# Patient Record
Sex: Male | Born: 1985 | Race: Black or African American | Hispanic: No | Smoking: Current every day smoker
Health system: Southern US, Community
[De-identification: ages and names within clinical notes are randomized; demographics above are authoritative.]

## PROBLEM LIST (undated history)

## (undated) ENCOUNTER — Emergency Department (HOSPITAL_COMMUNITY): Admission: EM | Payer: MEDICAID | Source: Home / Self Care

## (undated) DIAGNOSIS — R569 Unspecified convulsions: Secondary | ICD-10-CM

## (undated) DIAGNOSIS — F329 Major depressive disorder, single episode, unspecified: Secondary | ICD-10-CM

## (undated) DIAGNOSIS — F319 Bipolar disorder, unspecified: Secondary | ICD-10-CM

## (undated) DIAGNOSIS — F909 Attention-deficit hyperactivity disorder, unspecified type: Secondary | ICD-10-CM

## (undated) DIAGNOSIS — F209 Schizophrenia, unspecified: Secondary | ICD-10-CM

## (undated) DIAGNOSIS — F32A Depression, unspecified: Secondary | ICD-10-CM

## (undated) HISTORY — PX: NO PAST SURGERIES: SHX2092

## (undated) HISTORY — PX: OTHER SURGICAL HISTORY: SHX169

---
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
## 2007-08-18 ENCOUNTER — Emergency Department (HOSPITAL_COMMUNITY): Admission: EM | Admit: 2007-08-18 | Discharge: 2007-08-18 | Payer: Self-pay | Admitting: *Deleted

## 2008-08-08 ENCOUNTER — Emergency Department (HOSPITAL_COMMUNITY): Admission: EM | Admit: 2008-08-08 | Discharge: 2008-08-08 | Payer: Self-pay | Admitting: Family Medicine

## 2009-09-06 ENCOUNTER — Emergency Department (HOSPITAL_COMMUNITY): Admission: EM | Admit: 2009-09-06 | Discharge: 2009-09-06 | Payer: Self-pay | Admitting: Emergency Medicine

## 2011-03-09 NOTE — Discharge Summary (Signed)
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  The patient's status was made worse by being in jail for  car theft for one month without receiving his psychotropic medications,  Risperdal 2 mg b.i.d. and Depakote 1000 mg ER at bedtime, and he also takes  Ritalin 90 mg every morning.  For full details please see the typed  admission assessment.   SYNOPSIS OF PRESENT ILLNESS:  The patient was a poor historian and the  family was secretive and defensive, likely based in the cumulative  consequences the patient accrues not only for himself but also for the  family.  The patient suggests that his car theft was based in maladaptive  associations with peers involved in drug crimes.  The patient becomes bored  and self defeating in his activities.  He is not compliant with his  medications regularly either but has restarted his medications since  discharge.  He is threatening  to kill his grandmother and sister.  He has  been breaking dishes and slamming doors.  He talks and laughs to himself and  will posture for prolonged periods.  They did not prior to admission some  hypokinesia that may be a side effect of his Risperdal rather than posturing  associated with his psychosis.  He has some vague delusions and command  auditory hallucinations to harm himself and others.  The family has  acutely  disclosed that there is a relative with bipolar disorder.  The patient  resides with guardian grandmother and aunt is supportive as well.  Maternal  aunt has bipolar disorder.  Mother has depression.  Maternal grandmother  also has generalized anxiety.  Mother has alcoholism as does maternal uncle.  Maternal aunt has substance abuse with crack and alcohol, now in recovery.   INITIAL MENTAL STATUS EXAM:  The patient is odd and eccentric with some  posturing and possible bradykinesia and hypokinesia.  He did not have  delirium but he is eccentric.  He talks in a forceful, rather high-pitched  voice but seems to be defensive and reactionary.  Mood is labile.  He has  some ego preservation and affect is significantly preserved, so his mood is  chaotic and labile, with times of intermittent dysphoria.  He does have a  history of stuttering that is still a problem episodically.  He is more  homicidal than suicidal at the time of admission.   LABORATORY FINDINGS:  CBC on admission revealed platelet count slightly low  at 168,000  with reference range 170,000 to 325,000.  A repeat platelet count  3 days prior to discharge was 149,000, suggesting this was relative side  effect of Depakote.  Monocyte differential was 12% with reference range 3-10  and absolute monocyte count was normal at 600.  Neutrophil differential was  low at 39% but absolute neutrophil count was normal at 2200.  White count  was normal at 5600, hemoglobin 13, and MCV 89.  Comprehensive metabolic  panel was normal except albumin low at 3.4 with reference range 3.5-5.2 on  admission.  Sodium was normal was 143, potassium 4.2, glucose fasting 88,  creatinine 0.8, calcium 9.2, AST 18 and ALT 12.  GGT was normal at 1500.  Repeat hepatic function panel 3 days prior to discharge revealed albumin low  at 3.1 and total protein borderline low at 5.9 with reference range 6-8.3,  but AST normal at 19 and ALT 15.  Amylase was  normal 3 days prior to  discharge at 68.  TSH was normal at 2.431.  Depakote level of and was 102/3  mcg/ml and 4 days later was 98.2 mcg/ml suggesting steady state.  Urine drug  screen was negative with creatinine 234 mg/dl.  Urinalysis was normal with  specific gravity 1028 though with a trace of ketones.  Urine probes for  gonorrhea and chlamydia trichomatous by DNA amplification were both  negative.  Electrocardiogram on his discharge dose of 6 mg of Risperdal and  1000 mg of Depakote ER nightly revealed QTC of 411 milliseconds and QRS of  93 and PR of 135 milliseconds.  He had normal sinus rhythm, normal EKG with  conduction contra-indication to atypical anti-psychotics, with the patient  in general being a poor historian.   HOSPITAL COURSE AND TREATMENT:  General medical exam by [facility],  PA-C noted the patient's report of using alcohol every other day and liking  cigars.  The patient had scars around the left knee area K-9 dog bite in the  past.  He has a history of asthma.  He denies sexual activity.  He was  Tanner stage 4.  Admission height was 70 inches and weight 149 pounds and  discharge weight was 150 pounds.  Blood pressure on admission was 129/63  with heart rate of 69 sitting and 115/64 with heart rate of 74 standing.  Vital signs were normal throughout hospital stay.  At the time of discharge  supine blood pressure was 134/59 with heart rate of 74 and standing blood  pressure 142/77 with heart rate of 118.  The patient's Risperdal was  reestablished and Depakote continued.  Risperdal was gradually titrated up  midway through the hospital stay to 6 mg daily in the single nightly dose.  The patient continued to manifest some psychotic posturing and tended to  sleep by pulling his mattress off on the floor and covering his entire head  up.  This behavior continued until 2 days prior to discharge when he started sleeping more normally in his bed.  However he did  gradually tolerate  education on his chronic psychotic disorder diagnosis.  Bronson Ing, case  manager participated in treatment team staffing, documenting that she was  providing such education to the grandmother and the aunt.  The patient did  take his antihistamine throughout hospital stay.  As psychotic symptoms  cleared, he continued to have some bradykinesia and hypokinesia such that  Benztropine 1 mg nightly was prescribed on the day of discharge, as  grandmother emphasized that the  patient has some hypokinesia on Risperdal  even prior to his current recent decompensation and hospitalization.  The  patient required no seclusion, restraint or equivalent of such during the  hospital stay, including no p.r.n. medications being required.  The patient  complied with his medications in the hospital and had no significant side  effects  other than hypokinesia.  He participated in substance abuse  prevention, anger management, group, milieu, behavioral, individual, family,  special education, occupational and therapeutic recreational therapies.  His  homicidal and suicidal risk resolved and he was discharged to grandmother in  improved condition.   FINAL DIAGNOSES:  AXIS 1:  1.  Schizoaffective disorder, mixed.  2.  Attention deficit hyperactivity disorder, combined type, mild to      moderate severity currently.  3.  Stuttering.  4.  Psychoactive substance abuse not otherwise specified.  5.  Oppositional-defiant disorder.  6.  Other interpersonal problem.  7.  Other specified family circumstances.  8.  Noncompliance with treatment.  AXIS II:  Diagnosis deferred.  AXIS III:  1.  Chronic nose bleeds by history.  2.  Cigar smoking.  3.  Allergic rhinitis and asthma.  4.  Migraine.  5.  Refuses eyeglasses.  6.  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  AXIS IV:  Stressors:  Family - severe, acute and chronic; legal - severe, acute and  chronic; phase  of life - severe, acute and chronic; school - severe, acute  and chronic.  AXIS V:  Global assessment of function on admission 31 with highest in last year 58  and discharge global assessment of function was 52.   PLAN:  The patient gradually showed more social involvement, particularly  over the last couple of days of his hospital stay.  Midway through his  hospital stay as his psychotic symptoms were being clarified and relatively  confronted, the patient regressed for one morning, refusing to come out of  his room.  Subsequently he did work through the ability to participate in  social expectations and to ensure safety and protectiveness for his  grandmother and sister.  He must remain sober and have no substance abuse.  He must remain free from any felonious or other illegal behavior.  He was  discharged on the following medications.  1.  Depakote 500 mg ER tablet to take 2 every bedtime, quantity #60 with no      refill prescribed. 2.  Risperdal 2 mg tablet to take 3 tablets or 6 mg every bedtime, quantity      #90 with no refill prescribed.  3.  Benztropine 1 mg every bedtime, quantity #30 with no refill.  4.  Zyrtec 10 mg tablet daily if needed for allergies and nasal congestion,      own home supply.  He follows a regular diet and has no restrictions on physical activity.  Crisis and safety plans are outlined if needed.  He has probation  established and case management services and has been provided teaching on  chronic schizoaffective disorder for lifestyle and family based  interventions and support.  Family wishes as does case Production designer, theatre/television/film follow up at  the [facility] for his psychiatric care and they  will arrange the appointment, as per grandmother and aunt on the day of  discharge.     Glen   GJ/MEDQ  D:  09/15/2004  T:  09/15/2004  Job:  [id number]   cc:   Nelson Chimes  probation officer  [phone]  Bronson Ing, `  Case Manager   [phone]   [facility]  [address]  [place], [place]

## 2011-03-09 NOTE — H&P (Signed)
NAMELAITHAN, CONCHAS NO.:  [id number]   MEDICAL RECORD NO.:  [id number]          PATIENT TYPE:  IPS   LOCATION:  0501                          FACILITY:  [facility]   PHYSICIAN:  Jeanice Lim, M.D. DATE OF BIRTH:  07/18/1986   DATE OF ADMISSION:  12/17/2005  DATE OF DISCHARGE:                         PSYCHIATRIC ADMISSION ASSESSMENT   IDENTIFYING INFORMATION:  The patient is an 25 year old single African-  American male voluntarily admitted on December 15, 2005.   HISTORY OF PRESENT ILLNESS:  The patient presents with a history of  psychosis, having suicidal thoughts to hang himself.  The patient states he  also has been using substances, drinking wine, stating up to 5 liters on  Tuesday.  He has been drinking since the age of 54.  He drinks in the  morning.  He states that he would rather drink wine than drink coffee.  Smoking pot, sniffing powder.  The patient is experiencing auditory  hallucinations with voices telling him to kill people in the group home for  no specific reason.  He also has been taking Xanax.  Took some last week.  He states he took 5 only to get high not as a suicide attempt.  States he  is having trouble sleeping.  He states that he also sees dead people, seeing  his grandfather who he misses.  The patient has been noncompliant with his  medications for some time and has been in a group home since June of 2006.   PAST PSYCHIATRIC HISTORY:  The patient was here in 2005 on the child and  adolescent unit.  Was hospitalized at [facility] in June of 2006 for  cutting his wrist.  He is currently in outpatient at [facility].   SOCIAL HISTORY:  This is an 25 year old single African-American male.  He  does have two children, ages 73 and 1.  Children are with the mother of those  children.  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  He did have a legal guardian up  until he turned 22 which was his grandmother.  He is currently residing in a  group home.   Considering changing to a different group home.  The patient  states he was in jail last night for trespassing and has been in jail before  for car theft.  He states he is currently on probation until October of  2007.  He completed high school.   FAMILY HISTORY:  Has a maternal aunt who is bipolar.   ALCOHOL/DRUG HISTORY:  The patient smokes.  Mother with alcohol problems.   PRIMARY CARE PHYSICIAN:  Dr. Jacqulyn Bath and Dr. Fanny Skates.   MEDICAL PROBLEMS:  History of epilepsy, reports tonic clonic seizure  activity.  Last seizure was seven months ago.   MEDICATIONS:  Is to be on Depakote at bedtime, Risperdal 4 mg at bedtime,  Keppra 250 mg b.i.d.  Also takes an albuterol inhaler as needed.  He gets  his prescriptions at [facility] in [place].   ALLERGIES:  No known allergies.   PHYSICAL EXAMINATION:  This is a young, healthy male.  He was medically  cleared at the emergency department.  He did receive Tegretol 400 mg.   REVIEW OF SYSTEMS:  The patient reports a weight gain.  No fever.  No  chills.  Denies any cardiac problems.  Occasional shortness of breath.  The  patient does smoke.  Denies any bruising.  Positive seizure activity.  Clonic tonic seizure activity.  No nausea or vomiting.  Sexually active.  No  muscle weakness.  No joint swelling.  Positive for psychosis.   LABORATORY DATA:  BMET was within normal limits.  Alcohol level less than 5.  Urine drug screen was positive for benzodiazepines.   MENTAL STATUS EXAM:  He is a fully alert, cooperative male.  Fair eye  contact.  Casually dressed.  Speech:  The patient is sometimes difficult to  understand.  The patient feels fine.  Affect:  The patient is bright and  polite.  The patient endorsing auditory and visual hallucinations, some  passive suicidal thoughts and homicidal thoughts but will contract for  safety for self-harm and harm to others.  Cognitive function intact.  He  appears somewhat limited.  Judgment and  insight are poor.  He is somewhat of  a questionable historian, also a poor historian.   DIAGNOSES:  AXIS I:  Bipolar disorder, mixed.  Rule out schizoaffective  disorder.  Polysubstance abuse.  AXIS II:  Deferred.  AXIS III:  Epilepsy.  AXIS IV:  Problems related to social environment, other social problems  related to burden of illness, lack of primary support.  AXIS V:  Current 38.   PLAN:  Contract for safety.  Stabilize mood and thinking.  Will resume his  medications.  Reinforce medication compliance.  Will address substance abuse  and underage drinking.  The patient to be placed on seizure precautions.  Will again resume patient's medications and check Depakote levels for  therapeutic range.  The patient is to increase coping skills.  Casemanager  is to speak with patient about returning to group home as patient is having  second thoughts about returning to current facility.  The patient is to  follow with [facility].   TENTATIVE LENGTH OF STAY:  Five to six days.      Landry Corporal, N.P.      Jeanice Lim, M.D.  Electronically Signed    JO/MEDQ  D:  12/19/2005  T:  12/19/2005  Job:  [id number]

## 2011-03-09 NOTE — H&P (Signed)
NAMERILYN, UPSHAW NO.:  [id number]   MEDICAL RECORD NO.:  [id number]          PATIENT TYPE:  INP   LOCATION:  0203                          FACILITY:  [facility]   PHYSICIAN:  Beverly Milch, MD     DATE OF BIRTH:  1986-02-07   DATE OF ADMISSION:  09/07/2004  DATE OF DISCHARGE:                         PSYCHIATRIC ADMISSION ASSESSMENT   IDENTIFYING DATA:  This 25-year-old male, GED student at [employer], is admitted emergently voluntarily in transfer from [facility] as also referred by his probation officer,  Willa Frater, at [phone] for inpatient stabilization of suicide and  homicide risk with psychotic disorganization and mood instability.  The  patient reportedly has been under the outpatient care, since July of 2005,  of [facility] and acknowledges a Leonia Reader as a Pensions consultant.  The patient has been off of his medications for the last month as  he was placed in jail for car theft and was not given any of his  psychotropic medications there.   HISTORY OF PRESENT ILLNESS:  The patient is a poor historian and I do  attempt to confer with guardian grandmother the details.  Although he  suggests he has been off of his medications, his Depakote level, at the time  of admission, is 102 mg/ml suggesting either that he has been taking it  enough since release from jail to restore his blood level or that he might  have ingested in some other way such as self-destructiveness.  The patient  called his probation officer asking for help.  He has been breaking dishes  and slamming doors.  He has threatened to kill his grandmother and sister.  He talks and laughs to himself and postures at times for prolonged periods.  He reports delusions of other people being there and reports command  auditory hallucinations to harm himself and others.  The patient will not be  more specific about his own behavior.   He suggests that he was told to steal  the car by drug distribution associated peers.  The patient suggests that he  has access to street-type crime groups and also seems frightened by that.  Although the patient has made homicide threats, he is not otherwise violent  on admission, though he is odd and disorganized.  He has been disoriented at  home with grandmother reporting that the patient does not know where he is  and when it is lately since he got home for jail.  The patient has had no  intercurrent head injury that can be determined.  He can be social with  peers, though he is much more alienated around adults.  He reports using  alcohol every other day and cigars at times.  He had an inpatient stay at  [facility] in [place] for psychiatric care in 2001.  He has  apparently seen [facility] in the past for mental health care.  The  patient is not more honest or open about his intrapsychic experience.  He  does not acknowledge specific anxiety.  He does have a chronic history  of  stuttering and ADHD.  There was concern about substance abuse and certainly  has had oppositional defiance if not meeting criteria for conduct disorder  yet.   PAST MEDICAL HISTORY:  The patient reportedly has a history of asthma.  He  has dog bite scars on the left knee and left wrist from canines in the past.  He is not sexually active by self-report.  He does report chronic  nosebleeds.  He has migraines.  He has a history of asthma.  He refuses to  wear his glasses currently.  He has no medication allergies.  He denies  other organic central nervous system trauma.  His Depakote level is  therapeutic at 102 mg/ml with albumin slightly low at 3.4 and platelet count  low at 168,000 suggesting ongoing Depakote use.  Albumin is low at 3.4 with  lower limit of normal being 3.5.   REVIEW OF SYSTEMS:  The patient denies difficulty with gait, gaze or  countenance.  He denies exposure to  communicable disease or toxins.  He  denies rash, jaundice or purpura.  He has no chest pain, palpitations or  presyncope.  He has had no known seizure or syncope.  He has had no heart  murmur or arrhythmia.  He has had no abdominal pain, nausea, vomiting or  diarrhea.  There is no dysuria or arthralgia.  His Zyrtec seems likely for  chronic nosebleeds and possible allergic rhinitis.   IMMUNIZATIONS:  Up to date by history.   FAMILY HISTORY:  The patient resides with guardian grandmother.  He will not  give further information about biological parents.  However, he has  apparently been threatening to his biological sister including threatening  to kill her and grandmother.  Grandmother states he was just too  disorganized and disoriented at home to be safe and then started making  homicide and suicide threats.  The patient was in jail from October 1st  through October 28th of 2005 for car theft and driving without a license.  He apparently also faces court proceedings for a breaking and entering  charge.  He suggests that some of this was associated with drug distribution  by peers.  He suggests he is in the GED program at [employer].  He  repeated ninth grade three times.   ASSETS:  The patient does seem social and does not manifest ego  disintegration.   MENTAL STATUS EXAM:  Height is 70 inches and weight is 149 pounds with blood  pressure 129/63 with heart rate of 69 (sitting) and 115/64 with heart rate  of 74 (standing).  Neurological exam is generally intact.  The patient is  odd and eccentric with some posturing in mannerisms but no definite other  abnormal involuntary movements.  He is on Risperdal but does not manifest  overt parkinsonism or akathisia.  He is alert and seems oriented on arrival.  However, he has times of being highly eccentric.  He does not present with delirium.  The patient has no abnormal involuntary movements otherwise.  He  has no pathologic reflexes or  soft neurologic findings.  Gait and gaze are  intact.  Alternating motion rates are intact.  The patient seems to have a  suspicious interpersonal style and will partially cover his face and head.  He seems to simultaneously be irritable and reactionary while also cowering.  He talks fast with a rather high-pitched, forced voice.  He is more  comfortable with peers than adults.  He  gives only partial answers.  His  mood is labile.  He is disheveled but does not seem to have ego  disintegration suggesting definite schizophrenia but rather some ego  preservation and affect preservation and lability suggesting more  schizoaffective pathology.  He is associated with a drug distribution peer  group by his self-report and some of his suicide and homicide ideation may  also be incorporated into that group socially.  However, guardian  grandmother is significantly frightened and alienated by the patient's  threats to kill her and the patient's sister.  The patient has also made  suicide threats.  He would have chronic stuttering but does not manifest  stuttering at this time.  He has a history of inattention, hyperactivity and  impulsivity, though his impulsivity is most paramount now.  He reportedly  has been taking Ritalin 90 mg every morning, Depakote 1000 mg ER at bedtime,  Risperdal 2 mg b.i.d. and Zyrtec 5 mg q.h.s.   IMPRESSION:   AXIS I:  1.  Schizoaffective disorder, mixed (provisional diagnosis).  2.  Attention-deficit hyperactivity disorder, combined-type, moderate      severity by history.  3.  Stuttering.  4.  Psychoactive substance abuse not otherwise specified.  5.  Oppositional defiant disorder.  6.  Other interpersonal problem.  7.  Other specified family circumstances.  8.  Noncompliance with treatment, particularly by the jail in the month of      October.   AXIS II:  Diagnosis deferred.   AXIS III:  1.  Chronic nosebleeds by history.  2.  Asthma and likely allergic  rhinitis.  3.  Migraines.  4.  Refuses eyeglasses.   AXIS IV:  Stressors:  Family--severe, acute and chronic; legal--severe,  acute and chronic; phase of life--severe, acute and chronic; school--severe,  acute and chronic.   AXIS V:  Global Assessment of Functioning on admission 31; highest in last  year estimated 58.   PLAN:  The patient is admitted for inpatient adolescent psychiatric and  multidisciplinary, multimodal behavioral health treatment in a team-based  program at a locked psychiatric unit.   ESTIMATED LENGTH OF STAY:  Seven to 10 days.  Target symptoms for discharge  include stabilization of suicide and homicide risk, stabilization of active  psychosis, stabilization of mood and stabilization of disruptive behavior  that undermines the stability of other psychopathology.  Will discontinue  Ritalin.  Will restore Depakote and Risperdal.  Okay for Zyrtec but will observe and treat nosebleeds as necessary.  Cognitive behavioral therapy,  psychosocial rehabilitation, social skill training, anger management,  communication skills, family therapy and psychosocial coordination with  probation can be planned.     Glen   GJ/MEDQ  D:  09/08/2004  T:  09/08/2004  Job:  [id number]
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
## 2011-08-01 LAB — I-STAT 8, (EC8 V) (CONVERTED LAB)
Acid-Base Excess: 4 — ABNORMAL HIGH
BUN: 19
Bicarbonate: 30.6 — ABNORMAL HIGH
Chloride: 105
Glucose, Bld: 76
HCT: 46
Hemoglobin: 15.6
Operator id: 196461
Potassium: 3.5
Sodium: 141
TCO2: 32
pCO2, Ven: 54.3 — ABNORMAL HIGH
pH, Ven: 7.359 — ABNORMAL HIGH

## 2011-08-01 LAB — POCT I-STAT CREATININE
Creatinine, Ser: 1
Operator id: 196461

## 2011-08-01 LAB — VALPROIC ACID LEVEL: Valproic Acid Lvl: 125 — ABNORMAL HIGH

## 2011-12-19 ENCOUNTER — Encounter (HOSPITAL_COMMUNITY): Payer: Self-pay | Admitting: *Deleted

## 2011-12-19 ENCOUNTER — Emergency Department (HOSPITAL_COMMUNITY)
Admission: EM | Admit: 2011-12-19 | Discharge: 2011-12-19 | Disposition: A | Discharge status: Home / Self Care | Payer: Self-pay | Attending: Emergency Medicine | Admitting: Emergency Medicine

## 2011-12-19 DIAGNOSIS — J069 Acute upper respiratory infection, unspecified: Secondary | ICD-10-CM | POA: Insufficient documentation

## 2011-12-19 DIAGNOSIS — R05 Cough: Secondary | ICD-10-CM | POA: Insufficient documentation

## 2011-12-19 DIAGNOSIS — R059 Cough, unspecified: Secondary | ICD-10-CM | POA: Insufficient documentation

## 2011-12-19 DIAGNOSIS — R599 Enlarged lymph nodes, unspecified: Secondary | ICD-10-CM | POA: Insufficient documentation

## 2011-12-19 HISTORY — DX: Unspecified convulsions: R56.9

## 2011-12-19 MED ORDER — BENZONATATE 100 MG PO CAPS: 100.0000 mg | ORAL_CAPSULE | Freq: Three times a day (TID) | ORAL | Status: DC

## 2011-12-19 MED ORDER — IBUPROFEN 600 MG PO TABS: 600.0000 mg | ORAL_TABLET | Freq: Four times a day (QID) | ORAL | Status: AC | PRN

## 2011-12-19 NOTE — ED Provider Notes (Signed)
History     CSN: [id number]  Arrival date & time 12/19/11  0226   First MD Initiated Contact with Patient 12/19/11 [phone]      Chief Complaint  Patient presents with  . Sore Throat    (Consider location/radiation/quality/duration/timing/severity/associated sxs/prior treatment) HPI Pt with 1-2 weeks of sore throat, cough, congestion. No fever chills. Pt states he has had occasional blood streaks in the sputum. No post neck pain or stiffness. No sinus congestion or pain.  Past Medical History  Diagnosis Date  . Asthma   . Seizures     History reviewed. No pertinent past surgical history.  Family History  Problem Relation Age of Onset  . Seizures Father   . Asthma Other   . Seizures Other     History  Substance Use Topics  . Smoking status: Current Everyday Smoker -- 0.5 packs/day  . Smokeless tobacco: Not on file  . Alcohol Use: Yes     every 2-3d      Review of Systems  Constitutional: Negative for fever and chills.  HENT: Positive for congestion and sore throat. Negative for ear pain, facial swelling, rhinorrhea and sinus pressure.   Eyes: Negative for visual disturbance.  Respiratory: Positive for cough. Negative for chest tightness and wheezing.   Cardiovascular: Negative for chest pain, palpitations and leg swelling.  Gastrointestinal: Negative for nausea, vomiting and abdominal pain.  Musculoskeletal: Negative for myalgias and back pain.  Neurological: Negative for weakness, numbness and headaches.    Allergies  Review of patient's allergies indicates no known allergies.  Home Medications   Current Outpatient Rx  Name Route Sig Dispense Refill  . BENZONATATE 100 MG PO CAPS Oral Take 1 capsule (100 mg total) by mouth every 8 (eight) hours. 21 capsule 0  . IBUPROFEN 600 MG PO TABS Oral Take 1 tablet (600 mg total) by mouth every 6 (six) hours as needed for pain. 30 tablet 0    BP 132/77  Pulse 60  Temp(Src) 96.6 F (35.9 C) (Oral)  Resp 16  SpO2  98%  Physical Exam  Nursing note and vitals reviewed. Constitutional: He is oriented to person, place, and time. He appears well-developed and well-nourished. No distress.  HENT:  Head: Normocephalic and atraumatic.  Mouth/Throat: Oropharynx is clear and moist. No oropharyngeal exudate.       No sinus tenderness to percussion  Eyes: EOM are normal. Pupils are equal, round, and reactive to light.  Neck: Normal range of motion. Neck supple.       No nuchal rigidity or meningismus   Cardiovascular: Normal rate and regular rhythm.   Pulmonary/Chest: Effort normal and breath sounds normal. No respiratory distress. He has no wheezes. He has no rales. He exhibits no tenderness.  Abdominal: Soft. Bowel sounds are normal. There is no tenderness. There is no rebound and no guarding.  Musculoskeletal: Normal range of motion. He exhibits no edema and no tenderness.  Lymphadenopathy:    He has cervical adenopathy (mild ant cervical lymphadenopathy).  Neurological: He is alert and oriented to person, place, and time.  Skin: Skin is warm and dry. No rash noted. No erythema.  Psychiatric: He has a normal mood and affect. His behavior is normal.    ED Course  Procedures (including critical care time)  Labs Reviewed - No data to display No results found.   1. URI (upper respiratory infection)       MDM  Pt is well appearing. Will treat as viral URI.  Loren Racer, MD 12/19/11 [phone]

## 2011-12-19 NOTE — ED Notes (Signed)
C/o sore throat, onset 2d ago, brought here by friend, dropped off, carrying [employer] food & drink with him, also mentions cough & cold sx, "get hot a lot", denies other sx.

## 2011-12-19 NOTE — ED Notes (Signed)
C/o sorethroat and neck pain onset several days ago. C/o pain with swallowing.

## 2011-12-23 ENCOUNTER — Encounter (HOSPITAL_COMMUNITY): Payer: Self-pay | Admitting: *Deleted

## 2011-12-23 ENCOUNTER — Emergency Department (HOSPITAL_COMMUNITY)
Admission: EM | Admit: 2011-12-23 | Discharge: 2011-12-23 | Disposition: A | Discharge status: Home / Self Care | Payer: Self-pay | Attending: Emergency Medicine | Admitting: Emergency Medicine

## 2011-12-23 ENCOUNTER — Encounter (HOSPITAL_COMMUNITY): Payer: Self-pay

## 2011-12-23 ENCOUNTER — Emergency Department (HOSPITAL_COMMUNITY): Payer: Self-pay

## 2011-12-23 ENCOUNTER — Emergency Department (HOSPITAL_COMMUNITY)
Admission: EM | Admit: 2011-12-23 | Discharge: 2011-12-25 | Disposition: A | Discharge status: Other Acute Inpatient Hospital | Payer: Self-pay | Attending: Emergency Medicine | Admitting: Emergency Medicine

## 2011-12-23 DIAGNOSIS — J45909 Unspecified asthma, uncomplicated: Secondary | ICD-10-CM | POA: Insufficient documentation

## 2011-12-23 DIAGNOSIS — K1370 Unspecified lesions of oral mucosa: Secondary | ICD-10-CM | POA: Insufficient documentation

## 2011-12-23 DIAGNOSIS — X58XXXA Exposure to other specified factors, initial encounter: Secondary | ICD-10-CM | POA: Insufficient documentation

## 2011-12-23 DIAGNOSIS — IMO0002 Reserved for concepts with insufficient information to code with codable children: Secondary | ICD-10-CM | POA: Insufficient documentation

## 2011-12-23 DIAGNOSIS — G40909 Epilepsy, unspecified, not intractable, without status epilepticus: Secondary | ICD-10-CM | POA: Insufficient documentation

## 2011-12-23 DIAGNOSIS — IMO0001 Reserved for inherently not codable concepts without codable children: Secondary | ICD-10-CM | POA: Insufficient documentation

## 2011-12-23 DIAGNOSIS — K0381 Cracked tooth: Secondary | ICD-10-CM | POA: Insufficient documentation

## 2011-12-23 DIAGNOSIS — K1379 Other lesions of oral mucosa: Secondary | ICD-10-CM

## 2011-12-23 DIAGNOSIS — K089 Disorder of teeth and supporting structures, unspecified: Secondary | ICD-10-CM | POA: Insufficient documentation

## 2011-12-23 DIAGNOSIS — Z79899 Other long term (current) drug therapy: Secondary | ICD-10-CM | POA: Insufficient documentation

## 2011-12-23 DIAGNOSIS — S025XXA Fracture of tooth (traumatic), initial encounter for closed fracture: Secondary | ICD-10-CM

## 2011-12-23 DIAGNOSIS — R45850 Homicidal ideations: Secondary | ICD-10-CM | POA: Insufficient documentation

## 2011-12-23 HISTORY — DX: Attention-deficit hyperactivity disorder, unspecified type: F90.9

## 2011-12-23 HISTORY — DX: Schizophrenia, unspecified: F20.9

## 2011-12-23 HISTORY — DX: Bipolar disorder, unspecified: F31.9

## 2011-12-23 LAB — URINALYSIS, ROUTINE W REFLEX MICROSCOPIC
Glucose, UA: NEGATIVE mg/dL
Hgb urine dipstick: NEGATIVE
Nitrite: NEGATIVE
Protein, ur: NEGATIVE mg/dL
Urobilinogen, UA: 1 mg/dL (ref 0.0–1.0)
pH: 6 (ref 5.0–8.0)

## 2011-12-23 LAB — COMPREHENSIVE METABOLIC PANEL
ALT: 11 U/L (ref 0–53)
AST: 19 U/L (ref 0–37)
BUN: 20 mg/dL (ref 6–23)
Calcium: 9.9 mg/dL (ref 8.4–10.5)
Chloride: 101 mEq/L (ref 96–112)
Creatinine, Ser: 0.87 mg/dL (ref 0.50–1.35)
GFR calc Af Amer: >90 mL/min (ref >90)
GFR calc non Af Amer: >90 mL/min (ref >90)
Glucose, Bld: 68 mg/dL — ABNORMAL LOW (ref 70–99)
Sodium: 137 mEq/L (ref 135–145)
Total Bilirubin: 1 mg/dL (ref 0.3–1.2)
Total Protein: 7.7 g/dL (ref 6.0–8.3)

## 2011-12-23 LAB — RAPID URINE DRUG SCREEN, HOSP PERFORMED
Amphetamines: NOT DETECTED
Barbiturates: NOT DETECTED
Benzodiazepines: NOT DETECTED
Cocaine: NOT DETECTED
Opiates: NOT DETECTED
Tetrahydrocannabinol: POSITIVE — AB

## 2011-12-23 LAB — CBC
HCT: 40.9 % (ref 39.0–52.0)
Hemoglobin: 14.1 g/dL (ref 13.0–17.0)
MCH: 30 pg (ref 26.0–34.0)
MCHC: 34.5 g/dL (ref 30.0–36.0)
MCV: 87 fL (ref 78.0–100.0)
RDW: 13.7 % (ref 11.5–15.5)
WBC: 10.9 10*3/uL — ABNORMAL HIGH (ref 4.0–10.5)

## 2011-12-23 LAB — ETHANOL: Alcohol, Ethyl (B): <11 mg/dL (ref 0–11)

## 2011-12-23 MED ORDER — IBUPROFEN 800 MG PO TABS: 800.0000 mg | ORAL_TABLET | Freq: Three times a day (TID) | ORAL | Status: DC

## 2011-12-23 MED ORDER — IBUPROFEN 600 MG PO TABS: 600.0000 mg | ORAL_TABLET | Freq: Three times a day (TID) | ORAL | Status: DC | PRN

## 2011-12-23 MED ORDER — ZIPRASIDONE MESYLATE 20 MG IM SOLR: 10.0000 mg | INTRAMUSCULAR | Status: DC | PRN

## 2011-12-23 MED ORDER — ACETAMINOPHEN 325 MG PO TABS: 650.0000 mg | ORAL_TABLET | ORAL | Status: DC | PRN
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
## 2011-12-23 NOTE — Discharge Instructions (Signed)
Your x-ray did not show signs of a broken jaw. Please followup with your dentist as needed for your chipped tooth. Please take the ibuprofen 3 times a day, with food, for the next 3 days. Return to the ER for worsening condition.  Dental Injury Your exam shows that you have injured your teeth. The treatment of broken teeth and other dental injuries depends on how badly they are hurt. All dental injuries should be checked as soon as possible by a dentist if there are:  Loose teeth which may need to be wired or bonded with a plastic device to hold them in place.   Broken teeth with exposed tooth pulp which may cause a serious infection.   Painful teeth especially when you bite or chew.   Sharp tooth edges that cut your tongue or lips.  Sometimes, antibiotics or pain medicine are prescribed to prevent infection and control pain. Eat a soft or liquid diet and rinse your mouth out after meals with warm water. You should see a dentist or return here at once if you have increased swelling, increased pain or uncontrolled bleeding from the site of your injury. SEEK MEDICAL CARE IF:   You have increased pain not controlled with medicines.   You have swelling around your tooth, in your face or neck.   You have bleeding which starts, continues, or gets worse.   You have a fever.  Document Released: 10/08/2005 Document Revised: 09/27/2011 Document Reviewed: 10/07/2009 [facility] Patient Information 2012 [place], [place].
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
## 2011-12-24 LAB — VALPROIC ACID LEVEL: Valproic Acid Lvl: <10 ug/mL — ABNORMAL LOW (ref 50.0–100.0)

## 2011-12-24 NOTE — ED Provider Notes (Signed)
Psychiatric evaluation is ongoing. Disposition is currently pending. Patient has no current complaints and remains stable at this time.   Awaiting 400 hall bed   [facility] A. Patrica Duel, MD 12/24/11 [phone]

## 2011-12-24 NOTE — ED Notes (Signed)
Pt resting quietly. Waiting for placement. Labs back and valporic acid level low. Waiting for orders.
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
## 2011-12-24 NOTE — ED Notes (Signed)
ACT team in to see patient. 

## 2011-12-24 NOTE — ED Notes (Signed)
Spoke with Dr. Patria Mane ie low valporic acid level for this pt. He will review the pt chart, and is aware pt is to go to [facility] hall.

## 2011-12-24 NOTE — ED Notes (Signed)
Patient resting quietly and is w/o c/o.

## 2011-12-25 ENCOUNTER — Encounter (HOSPITAL_COMMUNITY): Payer: Self-pay | Admitting: *Deleted

## 2011-12-25 ENCOUNTER — Inpatient Hospital Stay (HOSPITAL_COMMUNITY)
Admission: AD | Admit: 2011-12-25 | Discharge: 2012-01-03 | DRG: 885 | Payer: PRIVATE HEALTH INSURANCE | Source: Ambulatory Visit | Attending: Psychiatry | Admitting: Psychiatry

## 2011-12-25 DIAGNOSIS — F1010 Alcohol abuse, uncomplicated: Secondary | ICD-10-CM | POA: Diagnosis present

## 2011-12-25 DIAGNOSIS — F29 Unspecified psychosis not due to a substance or known physiological condition: Secondary | ICD-10-CM | POA: Insufficient documentation

## 2011-12-25 DIAGNOSIS — F259 Schizoaffective disorder, unspecified: Principal | ICD-10-CM

## 2011-12-25 DIAGNOSIS — F319 Bipolar disorder, unspecified: Secondary | ICD-10-CM

## 2011-12-25 DIAGNOSIS — R45850 Homicidal ideations: Secondary | ICD-10-CM

## 2011-12-25 DIAGNOSIS — Z111 Encounter for screening for respiratory tuberculosis: Secondary | ICD-10-CM

## 2011-12-25 DIAGNOSIS — F250 Schizoaffective disorder, bipolar type: Secondary | ICD-10-CM | POA: Diagnosis present

## 2011-12-25 DIAGNOSIS — Z888 Allergy status to other drugs, medicaments and biological substances status: Secondary | ICD-10-CM

## 2011-12-25 DIAGNOSIS — F209 Schizophrenia, unspecified: Secondary | ICD-10-CM | POA: Insufficient documentation

## 2011-12-25 DIAGNOSIS — G40909 Epilepsy, unspecified, not intractable, without status epilepticus: Secondary | ICD-10-CM

## 2011-12-25 DIAGNOSIS — J45909 Unspecified asthma, uncomplicated: Secondary | ICD-10-CM

## 2011-12-25 DIAGNOSIS — Z79899 Other long term (current) drug therapy: Secondary | ICD-10-CM

## 2011-12-25 DIAGNOSIS — Z91013 Allergy to seafood: Secondary | ICD-10-CM

## 2011-12-25 DIAGNOSIS — F1210 Cannabis abuse, uncomplicated: Secondary | ICD-10-CM | POA: Diagnosis present

## 2011-12-25 MED ORDER — TRAZODONE HCL 50 MG PO TABS
150.0000 mg | ORAL_TABLET | Freq: Every evening | ORAL | Status: DC | PRN
  Administered 2011-12-25: 150 mg via ORAL
  Filled 2011-12-25: qty 2

## 2011-12-25 MED ORDER — ALUM & MAG HYDROXIDE-SIMETH 200-200-20 MG/5ML PO SUSP: 30.0000 mL | ORAL | Status: DC | PRN

## 2011-12-25 MED ORDER — IBUPROFEN 800 MG PO TABS
800.0000 mg | ORAL_TABLET | Freq: Three times a day (TID) | ORAL | Status: DC | PRN
  Administered 2012-01-01: 800 mg via ORAL
  Filled 2011-12-25: qty 1

## 2011-12-25 MED ORDER — HYDROXYZINE HCL 25 MG PO TABS
25.0000 mg | ORAL_TABLET | Freq: Three times a day (TID) | ORAL | Status: DC | PRN
  Administered 2012-01-01: 25 mg via ORAL

## 2011-12-25 MED ORDER — BENZTROPINE MESYLATE 1 MG PO TABS
1.0000 mg | ORAL_TABLET | Freq: Every day | ORAL | Status: DC
  Administered 2011-12-25 – 2011-12-30 (×6): 1 mg via ORAL
  Filled 2011-12-25 (×10): qty 1

## 2011-12-25 MED ORDER — DIVALPROEX SODIUM ER 500 MG PO TB24
500.0000 mg | ORAL_TABLET | ORAL | Status: DC
  Administered 2011-12-25 – 2012-01-03 (×18): 500 mg via ORAL
  Filled 2011-12-25 (×7): qty 1
  Filled 2011-12-25: qty 24
  Filled 2011-12-25 (×7): qty 1
  Filled 2011-12-25: qty 24
  Filled 2011-12-25 (×7): qty 1

## 2011-12-25 MED ORDER — HALOPERIDOL 5 MG PO TABS
10.0000 mg | ORAL_TABLET | Freq: Every day | ORAL | Status: DC
  Administered 2011-12-25 – 2012-01-02 (×9): 10 mg via ORAL
  Filled 2011-12-25 (×10): qty 2

## 2011-12-25 MED ORDER — MAGNESIUM HYDROXIDE 400 MG/5ML PO SUSP: 30.0000 mL | Freq: Every day | ORAL | Status: DC | PRN

## 2011-12-25 MED ORDER — ACETAMINOPHEN 325 MG PO TABS: 650.0000 mg | ORAL_TABLET | Freq: Four times a day (QID) | ORAL | Status: DC | PRN

## 2011-12-25 NOTE — ED Provider Notes (Signed)
Medical screening examination/treatment/procedure(s) were performed by non-physician practitioner and as supervising physician I was immediately available for consultation/collaboration.   Laray Anger, DO 12/25/11 2319

## 2011-12-25 NOTE — Discharge Planning (Signed)
Patient has been accepted to [facility] by [facility] bed 407-2. Patient's bed is not ready and patient's nurse will be notified when it is.  Patient's support paperwork has been completed. EDP notified and is in agreement with disposition. EDP will discharge pt to [facility]. Pt nurse notified as well. ALL appropriate paperwork completed and forwarded to [facility] for review.   Ileene Hutchinson , MSW, LCSWA 12/25/2011 12:11 PM [phone]
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
## 2011-12-25 NOTE — ED Notes (Signed)
Pt. requested to shower. Pt. Given new scrubs, socks, and towels. Pt. also given body wash/shampoo, deodorant, and toothbrush with toothpaste.

## 2011-12-25 NOTE — Tx Team (Signed)
Initial Interdisciplinary Treatment Plan  PATIENT STRENGTHS: (choose at least two) Ability for insight Active sense of humor Average or above average intelligence Capable of independent living Communication skills Financial means General fund of knowledge Motivation for treatment/growth Special hobby/interest Supportive family/friends  PATIENT STRESSORS: Educational concerns Financial difficulties Medication change or noncompliance Substance abuse   PROBLEM LIST: Problem List/Patient Goals Date to be addressed Date deferred Reason deferred Estimated date of resolution  anger       med adjustment                                                 DISCHARGE CRITERIA:  Ability to meet basic life and health needs Adequate post-discharge living arrangements Improved stabilization in mood, thinking, and/or behavior Reduction of life-threatening or endangering symptoms to within safe limits  PRELIMINARY DISCHARGE PLAN: Attend 12-step recovery group Participate in family therapy Return to previous living arrangement Return to previous work or school arrangements  PATIENT/FAMIILY INVOLVEMENT: This treatment plan has been presented to and reviewed with the patient, David Blankenship, and/or family member, The patient and family have been given the opportunity to ask questions and make suggestions.  Marchia Bond 12/25/2011, 5:21 PM

## 2011-12-26 DIAGNOSIS — F259 Schizoaffective disorder, unspecified: Principal | ICD-10-CM

## 2011-12-26 DIAGNOSIS — F1010 Alcohol abuse, uncomplicated: Secondary | ICD-10-CM

## 2011-12-26 DIAGNOSIS — F1210 Cannabis abuse, uncomplicated: Secondary | ICD-10-CM

## 2011-12-26 NOTE — Progress Notes (Signed)
Pt is lying in bed with eyes closed.  He is easily awakened to answer questions, but does so with his eyes closed.  He denies any thoughts of self harm or harm to others.  He denies A/V hallucinations.  He voices no needs/concerns at this time.  Earlier shift reported that pt had been silly and intrusive today.  Safety maintained with q15 minute checks.

## 2011-12-26 NOTE — Progress Notes (Signed)
[facility] Group Notes:  (Counselor/Nursing/MHT/Case Management/Adjunct)  12/26/2011 2:20 PM  Type of Therapy:  Group Therapy  Participation Level:  Did Not Attend  David Blankenship 12/26/2011, 2:20 PM

## 2011-12-26 NOTE — Progress Notes (Signed)
Pt resting in bed with eyes closed.  No distress observed.  Safety maintained with q15 minute checks. 

## 2011-12-26 NOTE — Progress Notes (Signed)
Pt states he slept well, appetite is good, energy level is low. Pt rates both depression and hopelessness as a 0. Pt denies SI/HI, and denies any physical c/o discomfort "right now".  Pt states he wants treatment for etoh. "I use daily and my grandmother wants me off the streets." Pt going to groups, at times, intrusive in groups. Hard to tell how serious pt is about change at this time.

## 2011-12-26 NOTE — Discharge Planning (Signed)
Met with patient in Aftercare Planning Group.   He was pleasant, soft-spoken, hard to understand as a result.  Patient has signed consent for staff to talk to his mother Melodye Ped, whom he says has permanently relocated to [place], and to his grandmother Deyon Chizek.  He stated that his grandmother is looking for a place for him to stay, because he should not be living on the street.  Patient did talk about seeing a doctor at [facility], but it has been quite a long time.  He admitted to having a substance abuse problem with drugs and alcohol, and even called himself an "addict".  When Case Manager offered him the opportunity for treatment at discharge, he was without hesitation interested.  During Aftercare Planning Group, Case Manager provided education about support groups, what they are and why they are beneficial.  Discussion was held with group about different kinds of support groups available and how they can assist with patient's recovery plans.  Also during Aftercare Planning Group, Case Manager provided psychoeducation on "Suicide Prevention Information."  This included descriptions of risk factors for suicide, warning signs that an individual is in crisis and thinking of suicide, and what to do if this occurs.  Pt indicated understanding of information provided, and will read brochure given upon discharge.   Patients were all very interactive in this discussion.  No case management needs today.  Ambrose Mantle, LCSW 12/26/2011, 9:39 AM

## 2011-12-26 NOTE — Tx Team (Signed)
Interdisciplinary Treatment Plan Update (Adult)  Date:  12/27/2011  Time Reviewed:  10:15AM-11:00AM  Progress in Treatment: Attending groups:  No, sleeping through them Participating in groups:    No Taking medication as prescribed:    Yes Tolerating medication:   Yes Family/Significant other contact made:  Consent received to talk to family Patient understands diagnosis:   No Discussing patient identified problems/goals with staff:   Yes Medical problems stabilized or resolved:   None Denies suicidal/homicidal ideation:  No, still has voices telling him to hurt self and others, but has no intent to do so Issues/concerns per patient self-inventory:   None Other:    New problem(s) identified: Yes, Describe:  Is not attending groups but is rather sleeping all day, then complains that he cannot sleelp  Reason for Continuation of Hospitalization: Anxiety Hallucinations Medication stabilization Suicidal ideation  Interventions implemented related to continuation of hospitalization:  Medication monitoring and adjustment, safety checks Q15 min., suicide risk assessment, group therapy, psychoeducation, collateral contact, aftercare planning, ongoing physician assessments, medication education  Additional comments:  Not applicable  Estimated length of stay:  4-5 days  Discharge Plan:  Patient would like to go to treatment from hospital, probably will refer to [facility]  New goal(s):  Not applicable  Review of initial/current patient goals per problem list:   1.  Goal(s):  Eliminate aggression (and other inappropriate behaviors) prior to discharge to point patient can function in community.  Met:  No  Target date:  By Discharge   As evidenced by:  Patient has not interacted enough with others (has been sleeping) to know if he will be able to control aggression.  He is constantly masturbating, and hears a voice telling him to do this.  2.  Goal(s):  Decide if and how to address  substance abuse issues.  Met:  No  Target date:  By Discharge   As evidenced by:  States he wants treatment, this is also mixed in with him needing housing  3.  Goal(s):  Deny SI / HI for 48 hours prior to discharge.  Met:  No  Target date:  By Discharge   As evidenced by:  Hears a voice telling him to hurt himself and others, must does not intend to act on this.  4.  Goal(s):  Reduce auditory hallucinations to baseline.  Met:  No  Target date:  By Discharge   As evidenced by:  Still hearing voices consistently.  A daytime dose of Haldol will be added.  Attendees: Patient:  David Blankenship  12/27/2011 10:15AM-11:00AM  Family:     Physician:  Dr. Harvie Heck [facility] 12/27/2011 10:15AM-11:00AM  Nursing:   Edwyna Shell, RN 12/27/2011 10:15AM -11:00AM   Case Manager:  Ambrose Mantle, LCSW 12/27/2011 10:15AM-11:00AM  Counselor:  [name], MT-BC 12/27/2011 10:15AM-11:00AM  Other:   Lynann Bologna, NP 12/27/2011 10:15AM-11:00AM  Other:      Other:      Other:       Scribe for Treatment Team:   Sarina Ser, 12/27/2011, 10:15AM-11:15AM
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
## 2011-12-26 NOTE — [facility] Suicide Risk Assessment (Signed)
Suicide Risk Assessment  Admission Assessment     Demographic factors:  Assessment Details Time of Assessment: Admission Information Obtained From: Patient Current Mental Status:    Loss Factors:  Loss Factors: Financial problems / change in socioeconomic status Historical Factors:  Historical Factors: Family history of mental illness or substance abuse Risk Reduction Factors:  Risk Reduction Factors: Responsible for children under 25 years of age;Sense of responsibility to family;Employed  CLINICAL FACTORS:   Alcohol/Substance Abuse/Dependencies More than one psychiatric diagnosis Schizoaffective Disorder - Bipolar Type.  COGNITIVE FEATURES THAT CONTRIBUTE TO RISK:  Loss of executive function    Diagnosis:  Axis I: Schizoaffective Disorder - Bipolar Type. Alcohol Abuse. Cannabis Abuse.  The patient was seen today and reports the following:   ADL's: Intact.  Sleep: The patient reports to sleeping well last night.  Appetite: The patient reports a good appetite today.   Mild>(1-10) >Severe  Hopelessness (1-10): 0  Depression (1-10): 0  Anxiety (1-10): 0   Suicidal Ideation: The patient denies any suicidal ideations today.  Plan: No  Intent: No  Means: No  Homicidal Ideation: The patient denies any homicidal ideations today.  Plan: No  Intent: No.  Means: No   General Appearance/Behavior: Cooperative and moderately manic in appearance.  Eye Contact: Good.  Speech: Increased in rate and volume with moderate pressuring noted. Motor Behavior: Moderately Manic.  Level of Consciousness: Alert and Oriented x 3.  Mental Status: Alert and Oriented x 3.  Mood: Moderately Manic.  Affect: Moderately Expansive. Anxiety Level: No anxiety noted or reported.  Thought Process: wnl but with moderate racing thoughts.  Thought Content: The patient denies any auditory and visual hallucinations or delusional thinking.  Perception: The patient is moderately manic.  Judgment: Fair to  Poor.  Insight: Fair to Poor.  Cognition: Oriented to time, place and person.   Time was spent today discussing with the patient the situation leading to his admission. The patient states that he has been off of his medications for several weeks with a return of his manic behaviors.  The patient reports that he has also been drinking 3-4 beers per day as well as using cannabis.  He states that he is now homeless since his grandmother moved to [place].  He reports to being in a group home in the past and now would like placement at a substance abuse treatment center.  Treatment Plan Summary:  1. Daily contact with patient to assess and evaluate symptoms and progress in treatment  2. Medication management  3. The patient will deny suicidal ideations or homicidal ideations for 48 hours prior to discharge and have a depression and anxiety rating of 3 or less. The patient will also deny any auditory or visual hallucinations or delusional thinking.  4. The patient will deny any symptoms of substance withdrawal at the time of discharge.  Plan:  1. Will restart the medication Haldol at 10 mgs po qhs for psychosis and mood stabilization. 2. Will restart the medication Cogentin at 1 mg po q am and hs for EPS. 3. Will restart the medication Depakote ER 500 mgs po q am and hs for mood stabilization and seizures. 4. Will continue to monitor.  5. Laboratory studies reviewed.  SUICIDE RISK:   Minimal: No identifiable suicidal ideation.  Patients presenting with no risk factors but with morbid ruminations; may be classified as minimal risk based on the severity of the depressive symptoms  David Blankenship 12/26/2011, 1:46 PM

## 2011-12-27 LAB — COMPREHENSIVE METABOLIC PANEL
AST: 17 U/L (ref 0–37)
Alkaline Phosphatase: 60 U/L (ref 39–117)
BUN: 14 mg/dL (ref 6–23)
CO2: 28 mEq/L (ref 19–32)
Chloride: 103 mEq/L (ref 96–112)
Creatinine, Ser: 1 mg/dL (ref 0.50–1.35)
GFR calc non Af Amer: >90 mL/min (ref >90)
Potassium: 4.3 mEq/L (ref 3.5–5.1)
Total Bilirubin: 0.4 mg/dL (ref 0.3–1.2)

## 2011-12-27 LAB — DIFFERENTIAL
Basophils Absolute: 0 10*3/uL (ref 0.0–0.1)
Basophils Relative: 0 % (ref 0–1)
Eosinophils Absolute: 0.1 10*3/uL (ref 0.0–0.7)
Lymphs Abs: 3.2 10*3/uL (ref 0.7–4.0)
Neutrophils Relative %: 47 % (ref 43–77)

## 2011-12-27 LAB — CBC
MCH: 29.3 pg (ref 26.0–34.0)
MCHC: 34 g/dL (ref 30.0–36.0)
Platelets: 229 10*3/uL (ref 150–400)
RBC: 4.68 MIL/uL (ref 4.22–5.81)
RDW: 13.6 % (ref 11.5–15.5)

## 2011-12-27 LAB — VALPROIC ACID LEVEL: Valproic Acid Lvl: 70.2 ug/mL (ref 50.0–100.0)

## 2011-12-27 MED ORDER — BUPROPION HCL ER (XL) 150 MG PO TB24
150.0000 mg | ORAL_TABLET | Freq: Every day | ORAL | Status: DC
  Administered 2011-12-27 – 2012-01-03 (×8): 150 mg via ORAL
  Filled 2011-12-27 (×9): qty 1
  Filled 2011-12-27: qty 14
  Filled 2011-12-27 (×2): qty 1

## 2011-12-27 MED ORDER — TRAZODONE HCL 150 MG PO TABS
150.0000 mg | ORAL_TABLET | Freq: Every day | ORAL | Status: DC
  Administered 2011-12-27 – 2011-12-30 (×4): 150 mg via ORAL
  Filled 2011-12-27 (×3): qty 1
  Filled 2011-12-27: qty 3
  Filled 2011-12-27 (×3): qty 1

## 2011-12-27 MED ORDER — HALOPERIDOL 5 MG PO TABS
5.0000 mg | ORAL_TABLET | Freq: Every day | ORAL | Status: DC
  Administered 2011-12-27 – 2012-01-03 (×8): 5 mg via ORAL
  Filled 2011-12-27 (×3): qty 1
  Filled 2011-12-27: qty 42
  Filled 2011-12-27 (×7): qty 1

## 2011-12-27 NOTE — Progress Notes (Signed)
Patient ID: David Blankenship, male   DOB: Dec 11, 1985, 26 y.o.   MRN: [id number] Fully alert and reclusive to his room - being resistant about going to group.  He is pleasant, polite and cooperative with me - has his hand down his pants continuously and I ask him to get his hand out of his pants.  Reports he slept very poorly last night and that voices were intermittently awakening him - they give him commands to masturbate frequently.  At times they have given him commands to harm himself or others, but not today or last night. Rates voices intensity a 2-3/10 on a 1-10 scale if 10 is the worst symptoms.    Says he has had no urges to hurt self or anyone else. Just feels tired since he couldn't sleep last night. Rates depression 7/10. Denies hopelessness.  Appetite is good.   Previous medication trials include: Haldol, Invega, and Risperdal.    He is taking all medications as prescribed.   Plan:  Add Wellbutrin for depression.   Have reviewed these findings and the plan with Dr. Allena Katz and he is in agreement.

## 2011-12-27 NOTE — Progress Notes (Signed)
Pt states he slept poor and needed medication to sleep. Appetite is good, energy and focus are low. Pt rates his depression and hopelessness at 1 each. Pt denies SI, minimal insight into making goals for aftercare. Pt going to 50% groups. Cooperative, safety maintained with q. 15 minute checks.

## 2011-12-27 NOTE — Discharge Planning (Signed)
Patient did not attend Aftercare Planning Group, but was seen in Treatment Team -- see plan update for details.  Patient continues to state he wants to go to treatment after hospitalization, but indicates that after that treatment he wants to go somewhere else because he is currently homeless.  CM cautioned him that [facility], which is where we are thinking to send him, will not allow him to stay if he does not stay on his medication and keep his psychosis under control.  Team also discussed his hygiene and he made a commitment to getting his clothes washed by [employer] and taking a shower.  Case Manager did utilization review for additional days.  David Mantle, LCSW 12/27/2011, 12:57 PM
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
## 2011-12-27 NOTE — Progress Notes (Signed)
[facility] Group Notes:  (Counselor/Nursing/MHT/Case Management/Adjunct)  12/27/2011 1:47 PM  Type of Therapy:  Group Therapy  Participation Level:  Did Not Attend   [name] 12/27/2011, 1:47 PM

## 2011-12-27 NOTE — Progress Notes (Signed)
Pt isolative much of the day, minimal participation in various group activities, did endorse some feelings of depression and still experiencing auditory hallucinations, pt did receive all medications without incident, support provided, will continue to monitor

## 2011-12-28 LAB — TSH: TSH: 1.485 u[IU]/mL (ref 0.350–4.500)

## 2011-12-28 MED ORDER — BENZTROPINE MESYLATE 1 MG PO TABS
1.0000 mg | ORAL_TABLET | Freq: Every day | ORAL | Status: DC
  Administered 2011-12-29 – 2011-12-30 (×2): 1 mg via ORAL
  Filled 2011-12-28 (×4): qty 1

## 2011-12-28 MED ORDER — BENZTROPINE MESYLATE 1 MG PO TABS: 1.0000 mg | ORAL_TABLET | Freq: Every day | ORAL | Status: DC

## 2011-12-28 NOTE — Progress Notes (Signed)
[facility] MD Progress Note  12/28/2011 4:58 PM  Diagnosis:  Axis I: Schizoaffective Disorder - Bipolar Type.  Alcohol Abuse.  Cannabis Abuse.   The patient was seen today and reports the following:   ADL's: Intact.  Sleep: The patient reports to sleeping well last night.  Appetite: The patient reports a good appetite today.   Mild>(1-10) >Severe  Hopelessness (1-10): 0  Depression (1-10): 0  Anxiety (1-10): 6  Suicidal Ideation: The patient adamantly denies any suicidal ideations today.  Plan: No  Intent: No  Means: No  Homicidal Ideation: The patient adamantly denies any homicidal ideations today.  Plan: No  Intent: No.  Means: No   General Appearance/Behavior: Cooperative and mild to moderately manic in appearance.  Eye Contact: Good.  Speech: Increased in rate and volume with mild to moderate pressuring noted.  Motor Behavior: Mild to moderately manic.  Level of Consciousness: Alert and Oriented x 3.  Mental Status: Alert and Oriented x 3.  Mood: Mild to moderately manic.  Affect: Mild to moderately expansive.  Anxiety Level: Moderate anxiety reported.  Thought Process: Mild racing thoughts.  Thought Content: The patient denies any auditory and visual hallucinations or delusional thinking.  Perception: The patient is mild to moderately manic.  Judgment: Fair.  Insight: Fair.  Cognition: Oriented to time, place and person.  Sleep:  Number of Hours: 6.75    Vital Signs:Blood pressure 117/70, pulse 82, temperature 97.8 F (36.6 C), temperature source Oral, resp. rate 17, height 5\' 11"  (1.803 m), weight 66.225 kg (146 lb).  Current Medications: Current Facility-Administered Medications  Medication Dose Route Frequency Provider Last Rate Last Dose  . alum & mag hydroxide-simeth (MAALOX/MYLANTA) 200-200-20 MG/5ML suspension 30 mL  30 mL Oral Q4H PRN Curlene Labrum Pablo Stauffer, MD      . benztropine (COGENTIN) tablet 1 mg  1 mg Oral QHS Curlene Labrum Nori Poland, MD   1 mg at 12/27/11 2209  .  benztropine (COGENTIN) tablet 1 mg  1 mg Oral Q1400 Curlene Labrum Carrolyn Hilmes, MD      . buPROPion (WELLBUTRIN XL) 24 hr tablet 150 mg  150 mg Oral Daily Curlene Labrum Raylynn Hersh, MD   150 mg at 12/28/11 0751  . divalproex (DEPAKOTE ER) 24 hr tablet 500 mg  500 mg Oral BH-qamhs Jaleel Allen D Treshun Wold, MD   500 mg at 12/28/11 0751  . haloperidol (HALDOL) tablet 10 mg  10 mg Oral QHS Curlene Labrum Nyko Gell, MD   10 mg at 12/27/11 2209  . haloperidol (HALDOL) tablet 5 mg  5 mg Oral Daily Curlene Labrum Brian Zeitlin, MD   5 mg at 12/28/11 0751  . hydrOXYzine (ATARAX/VISTARIL) tablet 25 mg  25 mg Oral TID PRN Ronny Bacon, MD      . ibuprofen (ADVIL,MOTRIN) tablet 800 mg  800 mg Oral Q8H PRN Curlene Labrum Larue Lightner, MD      . magnesium hydroxide (MILK OF MAGNESIA) suspension 30 mL  30 mL Oral Daily PRN Curlene Labrum Caitriona Sundquist, MD      . traZODone (DESYREL) tablet 150 mg  150 mg Oral QHS Viviann Spare, NP   150 mg at 12/27/11 2209  . DISCONTD: benztropine (COGENTIN) tablet 1 mg  1 mg Oral Q1400 Ronny Bacon, MD       Lab Results:  Results for orders placed during the hospital encounter of 12/25/11 (from the past 48 hour(s))  COMPREHENSIVE METABOLIC PANEL     Status: Abnormal   Collection Time   12/27/11  8:13 PM  Component Value Range Comment   Sodium 139  135 - 145 (mEq/L)    Potassium 4.3  3.5 - 5.1 (mEq/L)    Chloride 103  96 - 112 (mEq/L)    CO2 28  19 - 32 (mEq/L)    Glucose, Bld 53 (*) 70 - 99 (mg/dL)    BUN 14  6 - 23 (mg/dL)    Creatinine, Ser 4.78  0.50 - 1.35 (mg/dL)    Calcium 9.3  8.4 - 10.5 (mg/dL)    Total Protein 6.9  6.0 - 8.3 (g/dL)    Albumin 3.7  3.5 - 5.2 (g/dL)    AST 17  0 - 37 (U/L)    ALT 10  0 - 53 (U/L)    Alkaline Phosphatase 60  39 - 117 (U/L)    Total Bilirubin 0.4  0.3 - 1.2 (mg/dL)    GFR calc non Af Amer >90  >90 (mL/min)    GFR calc Af Amer >90  >90 (mL/min)   CBC     Status: Normal   Collection Time   12/27/11  8:13 PM      Component Value Range Comment   WBC 7.2  4.0 - 10.5 (K/uL)    RBC 4.68   4.22 - 5.81 (MIL/uL)    Hemoglobin 13.7  13.0 - 17.0 (g/dL)    HCT 29.5  62.1 - 30.8 (%)    MCV 86.1  78.0 - 100.0 (fL)    MCH 29.3  26.0 - 34.0 (pg)    MCHC 34.0  30.0 - 36.0 (g/dL)    RDW 65.7  84.6 - 96.2 (%)    Platelets 229  150 - 400 (K/uL)   DIFFERENTIAL     Status: Normal   Collection Time   12/27/11  8:13 PM      Component Value Range Comment   Neutrophils Relative 47  43 - 77 (%)    Neutro Abs 3.4  1.7 - 7.7 (K/uL)    Lymphocytes Relative 44  12 - 46 (%)    Lymphs Abs 3.2  0.7 - 4.0 (K/uL)    Monocytes Relative 7  3 - 12 (%)    Monocytes Absolute 0.5  0.1 - 1.0 (K/uL)    Eosinophils Relative 2  0 - 5 (%)    Eosinophils Absolute 0.1  0.0 - 0.7 (K/uL)    Basophils Relative 0  0 - 1 (%)    Basophils Absolute 0.0  0.0 - 0.1 (K/uL)   VALPROIC ACID LEVEL     Status: Normal   Collection Time   12/27/11  8:13 PM      Component Value Range Comment   Valproic Acid Lvl 70.2  50.0 - 100.0 (ug/mL)   TSH     Status: Normal   Collection Time   12/27/11  8:13 PM      Component Value Range Comment   TSH 1.485  0.350 - 4.500 (uIU/mL)   T3, FREE     Status: Normal   Collection Time   12/27/11  8:13 PM      Component Value Range Comment   T3, Free 3.3  2.3 - 4.2 (pg/mL)   T4, FREE     Status: Normal   Collection Time   12/27/11  8:13 PM      Component Value Range Comment   Free T4 1.62  0.80 - 1.80 (ng/dL)    Time was spent today discussing with the patient his current symptoms.  The patient states  that he feels the severity of his symptoms is improving.  He reports to sleeping better with a good appetite.  He continues to report to wanting to enter treatment for his substance abuse issues once his mood is more stable.  Treatment Plan Summary:  1. Daily contact with patient to assess and evaluate symptoms and progress in treatment  2. Medication management  3. The patient will deny suicidal ideations or homicidal ideations for 48 hours prior to discharge and have a depression and anxiety  rating of 3 or less. The patient will also deny any auditory or visual hallucinations or delusional thinking.  4. The patient will deny any symptoms of substance withdrawal at the time of discharge.   Plan:  1. Will continue the patient on his current medications. 2. Will increase the medication Cogentin to 1 mg po q 2 pm and hs to correspond to his dosing of Haldol for EPS. 3. Will continue to monitor.  David Blankenship 12/28/2011, 4:58 PM

## 2011-12-28 NOTE — Progress Notes (Signed)
Pt has been in room and isolative much of the day today, limited interaction or participation in activities, pt has denied any suicidal thoughts today, has stated he is feeling good, "chillin, chillin" Pt did receive medications without incident, support provided, will continue to monitor

## 2011-12-28 NOTE — Progress Notes (Signed)
Pt states he slept well, appetite is good, energy level normal and ability to focus is good. Pt denies depression and hopelessness, pt denies SI/HI. Pt c/o some neck pain, will continue to evaluate. Pt is making positive goals to "change my people, place and things and to stay positive."

## 2011-12-28 NOTE — Discharge Planning (Signed)
Did not see patient today, as he did not attend Aftercare Planning Group.  He told other staff that he wants to go to treatment, but at this point he cannot be referred for aftercare due to the continued masturbation and response to internal stimuli, despite what he reports his depression, etc., to be.  Peer review done, and auth'd days through Sunday 3/10.  Ambrose Mantle, LCSW 12/28/2011, 5:00 PM

## 2011-12-28 NOTE — Progress Notes (Signed)
Patient ID: David Blankenship, male   DOB: 08-06-1986, 26 y.o.   MRN: [id number] Pt. In bed, no distress noted, eyes closed resp. Even. Staff will continue to monitor for safety q70min. Pt. Is safe.

## 2011-12-29 NOTE — Progress Notes (Signed)
Patient ID: David Blankenship, male   DOB: Aug 29, 1986, 26 y.o.   MRN: [id number] 12-29-11 nursing shift note: this pt was pleasant this am and went to breakfast. On his inventory sheet his stated he slept well. Appetite good, energy normal attention improving with depression and hopelessness both at 3. Denied any suicide ideation. His sheet was incomplete. He stated no problem staying on medications after discharge.  He has remained cooperative and pleasant throughout the day. rn will monitor and safety checks continue every 15 minutes.

## 2011-12-29 NOTE — Progress Notes (Signed)
Patient ID: Shjon Lizarraga, male   DOB: [birth year], 26 y.o.   MRN: [id number] Con is back in his bed sleeping after eating breakfast. He is calm cooperative and fully alert. Says that he would like his grandmother or mother called is provided phone numbers. He says the counselor has spoken with her but I have not seen this in the notes yet. He denies any suicidal thoughts. Reports he's been sleeping very good. He thinks that he is responding well to the medications. He has no concerns.  Speech is non-pressured, eye contact improved, less hyperverbal, and speech at normal pace and inflection. No dangerous thoughts.   He denies depression or anxiety. He has had no visitors he thinks that his mother might be coming to see him Sunday or Monday.  He has asked me to call his mother Melodye Ped at [phone] or his grandmother at [phone] because they are concerned about him.  VPA level 70. 2 on current dose. Thyroid panel is normal.  Afebrile with normal vital signs.   Plan: Will ask counselor to pursue family contacts.

## 2011-12-29 NOTE — Progress Notes (Signed)
Patient ID: David Blankenship, male   DOB: November 09, 1985, 26 y.o.   MRN: [id number] [facility] Group Notes:  (Counselor/Nursing/MHT/Case Management/Adjunct)  12/29/2011 11 AM  Type of Therapy:  Group Therapy, Dance/Movement Therapy   Participation Level:  Did Not Attend   Kym Groom

## 2011-12-30 NOTE — Progress Notes (Signed)
Patient ID: David Blankenship, male   DOB: 04-11-1986, 26 y.o.   MRN: [id number] David Blankenship is fully alert, cooperative, pleasant, and polite. He reports that he supposed to be going to a alcohol rehab facility after discharge. His main concern today is that he is he is able to go to a facility where he can meet females. He has rated depression of 0/10 anxiety at 0/10, and no suicidal thoughts. He's been cooperative with staff and taking all medications as prescribed. No signs of EPS. group participation satisfactory.

## 2011-12-30 NOTE — Progress Notes (Signed)
Patient ID: David Blankenship, male   DOB: Mar 08, 1986, 26 y.o.   MRN: [id number]   [facility] Group Notes:  (Counselor/Nursing/MHT/Case Management/Adjunct)  12/30/2011 11 AM  Type of Therapy:  Group Therapy, Dance/Movement Therapy   Participation Level:  Minimal  Participation Quality:  Redirectable and Sharing  Affect:  Appropriate  Cognitive:  Oriented  Insight:  Limited  Engagement in Group:  Limited  Engagement in Therapy:  Limited  Modes of Intervention:  Clarification, Problem-solving, Role-play, Socialization and Support  Summary of Progress/Problems:  Group practiced healthy interpersonal and intrapersonal communication through movement. Pt shared that he a connected to his self. Pt came into group late and would have side conversations with peers that required redirection from counselor. Pt eventually fully engaged and contributed in the group process. He was humorous and displayed a genuine smile. Pt indicated a lack of sencerity in focusing on creating and implementing a recovery plan.    Thomasena Edis, [employer]

## 2011-12-30 NOTE — Progress Notes (Signed)
Patient ID: David Blankenship, male   DOB: September 16, 1986, 26 y.o.   MRN: [id number] The patient interacted appropriately all evening interacting in the milieu. Denied any auditory hallucinations. Stated he had a good day and wanted to know if he was going to be discharged soon. Stated that he might go to drug rehab for two weeks after discharge.

## 2011-12-30 NOTE — Progress Notes (Signed)
Patient ID: David Blankenship, male   DOB: 1986/08/21, 26 y.o.   MRN: [id number] 12-30-11 nursing shift note: pt has been pleasant and cooperative this am. He has voiced no complaints. On his inventory sheet he wrote he slept well, appetite good, energy normal, attention improving with depression and hopelessness both at 2. He stated he was having some cravings. Denied any suicide ideation. Physical problems have been a headache. His pain today he rated at a 5. He doesn't appear to be in any distress. When he goes home he plans on doing the right thing, he stated. He stated no problem staying on medications after discharge.  He also indicated that he want to go to long term rehab. rn will monitor and safety checks continue every 15 minutes.

## 2011-12-31 MED ORDER — BENZTROPINE MESYLATE 0.5 MG PO TABS
0.5000 mg | ORAL_TABLET | Freq: Every day | ORAL | Status: DC
  Administered 2012-01-01 – 2012-01-02 (×2): 0.5 mg via ORAL
  Filled 2011-12-31 (×3): qty 1

## 2011-12-31 MED ORDER — BENZTROPINE MESYLATE 0.5 MG PO TABS: 0.5000 mg | ORAL_TABLET | Freq: Every day | ORAL | Status: DC

## 2011-12-31 MED ORDER — TRAZODONE HCL 100 MG PO TABS
200.0000 mg | ORAL_TABLET | Freq: Every day | ORAL | Status: DC
  Administered 2011-12-31 – 2012-01-02 (×3): 200 mg via ORAL
  Filled 2011-12-31 (×2): qty 2
  Filled 2011-12-31: qty 28
  Filled 2011-12-31 (×3): qty 2

## 2011-12-31 MED ORDER — BENZTROPINE MESYLATE 0.5 MG PO TABS
0.5000 mg | ORAL_TABLET | Freq: Every day | ORAL | Status: DC
  Administered 2011-12-31 – 2012-01-02 (×3): 0.5 mg via ORAL
  Filled 2011-12-31 (×4): qty 1
  Filled 2011-12-31: qty 28
  Filled 2011-12-31: qty 1

## 2011-12-31 NOTE — Discharge Planning (Signed)
Met with patient in Aftercare Planning Group.   He was pleasant and appropriate throughout group.  Case Manager did referral to [facility] in [place], and also put patient on list for [facility] in [facility] (3/25 at 8AM).  Ambrose Mantle, LCSW 12/31/2011, 3:58 PM

## 2011-12-31 NOTE — Progress Notes (Signed)
12/31/2011  Time: 0930   Group Topic/Focus: The focus of this group is on enhancing patients' problem solving skills, which involves identifying the problem, brainstorming solutions and choosing and trying a solution.   Participation Level:  Minimal  Participation Quality:  Attentive  Affect:  Blunted  Cognitive:  Alert  Additional Comments: Patient quiet unless spoken to, patient reports feeling better on his medications, saying he won't kill anyone as long as he is on them.   David Blankenship  12/31/2011 2:00 PM

## 2011-12-31 NOTE — Progress Notes (Signed)
Adult Services Patient-Family Contact/Session  Attendees:  Patient's grandmother, [name]                      Late entry: call made on 12/27/11 Goal(s):  Collateral, discharge planning                    Safety Concerns:  Fearful he will get into trouble if he doesn't get help  Narrative:  Briefly spoke to grandmother. She wants him to get long term treatment for his addiction. Informed her that we were making a referral to 28 day program. She requested an IQ test and says that father was brain damaged and biological mother limited. Told her that the hospital did not do IQ testing. Discussed that case manager in the 28 day program would then be responsible for follow up plans, which can include a group home if they are still interested in this.  Barrier(s):  None   Interventions:  Information, discharge planning,  Recommendation(s):  28 day SA program  Follow-up Required:  No  Explanation:    [name] 12/31/2011, 9:21 AM

## 2011-12-31 NOTE — Progress Notes (Signed)
[facility] MD Progress Note  12/31/2011 4:04 PM  Diagnosis:  Axis I: Schizoaffective Disorder - Bipolar Type.  Alcohol Abuse.  Cannabis Abuse.   The patient was seen today and reports the following:   ADL's: Intact.  Sleep: The patient reports to having significant difficulty initiating and maintaining sleep.  Appetite: The patient reports a good appetite today.   Mild>(1-10) >Severe  Hopelessness (1-10): 0  Depression (1-10): 0  Anxiety (1-10): 0   Suicidal Ideation: The patient adamantly denies any suicidal ideations today.  Plan: No  Intent: No  Means: No  Homicidal Ideation: The patient adamantly denies any homicidal ideations today.  Plan: No  Intent: No.  Means: No   General Appearance/Behavior: Cooperative today with some mild manic behavior continuing.  Eye Contact: Good.  Speech: Appropriate in rate and volume with no pressuring today.  Motor Behavior: Mildly manic.  Level of Consciousness: Alert and Oriented x 3.  Mental Status: Alert and Oriented x 3.  Mood: Mildly manic.  Affect: Mildly expansive.  Anxiety Level: No anxiety reported or noted.  Thought Process: wnl.  Thought Content: The patient denies any auditory and visual hallucinations or delusional thinking.  Perception: wnl.  Judgment: Fair to Good.  Insight: Fair to Good.  Cognition: Oriented to time, place and person.  Sleep:  Number of Hours: 5.5    Vital Signs:Blood pressure 100/61, pulse 81, temperature 97.5 F (36.4 C), temperature source Oral, resp. rate 16, height 5\' 11"  (1.803 m), weight 66.225 kg (146 lb).  Current Medications: Current Facility-Administered Medications  Medication Dose Route Frequency Provider Last Rate Last Dose  . alum & mag hydroxide-simeth (MAALOX/MYLANTA) 200-200-20 MG/5ML suspension 30 mL  30 mL Oral Q4H PRN Curlene Labrum Fortune Brannigan, MD      . benztropine (COGENTIN) tablet 0.5 mg  0.5 mg Oral QHS Amanpreet Delmont D Marialena Wollen, MD      . benztropine (COGENTIN) tablet 0.5 mg  0.5 mg Oral  Q1400 Curlene Labrum Brandis Matsuura, MD      . buPROPion (WELLBUTRIN XL) 24 hr tablet 150 mg  150 mg Oral Daily Curlene Labrum Rayquon Uselman, MD   150 mg at 12/31/11 0956  . divalproex (DEPAKOTE ER) 24 hr tablet 500 mg  500 mg Oral BH-qamhs Welcome Fults D Aairah Negrette, MD   500 mg at 12/31/11 0955  . haloperidol (HALDOL) tablet 10 mg  10 mg Oral QHS Curlene Labrum Zerrick Hanssen, MD   10 mg at 12/30/11 2202  . haloperidol (HALDOL) tablet 5 mg  5 mg Oral Daily Curlene Labrum Nikash Mortensen, MD   5 mg at 12/31/11 1610  . hydrOXYzine (ATARAX/VISTARIL) tablet 25 mg  25 mg Oral TID PRN Ronny Bacon, MD      . ibuprofen (ADVIL,MOTRIN) tablet 800 mg  800 mg Oral Q8H PRN Curlene Labrum Tobey Schmelzle, MD      . magnesium hydroxide (MILK OF MAGNESIA) suspension 30 mL  30 mL Oral Daily PRN Curlene Labrum Breccan Galant, MD      . traZODone (DESYREL) tablet 200 mg  200 mg Oral QHS Curlene Labrum Keilan Nichol, MD      . DISCONTD: benztropine (COGENTIN) tablet 0.5 mg  0.5 mg Oral Q1400 Ronny Bacon, MD      . DISCONTD: benztropine (COGENTIN) tablet 1 mg  1 mg Oral QHS Curlene Labrum Aldine Chakraborty, MD   1 mg at 12/30/11 2202  . DISCONTD: benztropine (COGENTIN) tablet 1 mg  1 mg Oral Q1400 Curlene Labrum Dashonna Chagnon, MD   1 mg at 12/30/11 1348  . DISCONTD: traZODone (DESYREL)  tablet 150 mg  150 mg Oral QHS Viviann Spare, NP   150 mg at 12/30/11 2203   Lab Results: No results found for this or any previous visit (from the past 48 hour(s)).  Time was spent today discussing with the patient his current symptoms.  The patient states that he is having difficulty initiating and maintaining sleep.  He also reports some urinary retention with the recent increase in the medication Cogentin.  The patient continues to express interest in being sent to a longer term treatment facility for ongoing treatment of his substance abuse issues once he is stabilized.   Treatment Plan Summary:  1. Daily contact with patient to assess and evaluate symptoms and progress in treatment  2. Medication management  3. The patient will deny  suicidal ideations or homicidal ideations for 48 hours prior to discharge and have a depression and anxiety rating of 3 or less. The patient will also deny any auditory or visual hallucinations or delusional thinking.  4. The patient will deny any symptoms of substance withdrawal at the time of discharge.   Plan:  1. Will continue the patient on his current medications.  2. Will decrease the medication Cogentin to 0.5 mg po q 2 pm and hs due to urinary retention.  3. Will continue to monitor. 4. The case manager is in the process of locating placement for this patient for longer term substance abuse treatment.  Blessing Ozga 12/31/2011, 4:04 PM

## 2011-12-31 NOTE — Tx Team (Addendum)
Interdisciplinary Treatment Plan Update (Adult)  Date:  12/31/2011  Time Reviewed:  10:15AM-11:00AM  Progress in Treatment: Attending groups:  Yes Participating in groups:    Yes Taking medication as prescribed:    Yes Tolerating medication:   Yes Family/Significant other contact made:  Yes Patient understands diagnosis:   Yes Discussing patient identified problems/goals with staff:   Yes Medical problems stabilized or resolved:   States is having trouble urinating Denies suicidal/homicidal ideation:  Yes Issues/concerns per patient self-inventory:   None Other:    New problem(s) identified: No, Describe:    Reason for Continuation of Hospitalization: Medication stabilization Other; describe placement into rehab  Interventions implemented related to continuation of hospitalization:  Medication monitoring and adjustment, safety checks Q15 min., suicide risk assessment, group therapy, psychoeducation, collateral contact, aftercare planning, ongoing physician assessments, medication education  Additional comments:  Not applicable  Estimated length of stay:  2-3 days  Discharge Plan:  Go to rehab  New goal(s):  Not applicable  Review of initial/current patient goals per problem list:   1.  Goal(s):  Eliminate aggression (and other inappropriate behaviors) prior to discharge to the point where patient can function in community.  Met:  Yes  Target date:  By Discharge   As evidenced by:  Patient is much calmer and more appropriate, pleasant to be around.  2.  Goal(s):  Decide if and how to address substance abuse issues.  Met:  No  Target date:  By Discharge   As evidenced by:  Wants treatment, referral made to [facility], will also call [facility] daily starting Tuesday 3/12 for a rehab bed.  3.  Goal(s):  Deny SI / HI for 48 hours prior to discharge.  Met:  Yes  Target date:  By Discharge   As evidenced by:  Has no SI/HI for several days.  4.  Goal(s):  Reduce auditory  hallucinations to baseline.  Met:  Yes  Target date:  By Discharge   As evidenced by:   No voices in 3 days.  Attendees: Patient:  Did not attend    Family:     Physician:  Dr. Harvie Heck Readling 12/31/2011 10:15AM-11:00AM  Nursing:   Tacy Learn, RN 12/31/2011 10:15AM -11:00AM   Case Manager:  Ambrose Mantle, LCSW 12/31/2011 10:15AM-11:00AM  Counselor:  [name], MT-BC 12/31/2011 10:15AM-11:00AM  Other:   Lynann Bologna, NP 12/31/2011 10:15AM-11:00AM  Other:      Other:      Other:       Scribe for Treatment Team:   Sarina Ser, 12/31/2011, 10:15AM-11:15AM

## 2011-12-31 NOTE — Progress Notes (Signed)
Patient ID: David Blankenship, male   DOB: 27-Nov-1985, 26 y.o.   MRN: [id number] Was out and about on hall and in dayroom this evening, watched tv, attended group, came over to the med window for his meds, then changed his mind and didn't want them.  Went back to the dayroom, to his room, back out and needed encouragement to take them.  Was evasive, poor eye contact, seemed preoccupied.  Will continue to monitor.

## 2011-12-31 NOTE — Progress Notes (Signed)
Pt has been up and has been active and participating in various milieu activities, pt has spoken about wanting to get into a substance abuse treatment program when he leaves from here, pt has denied any feelings of suicidality and only spoke about not being able to sleep well at night, pt did receive all medications without incident, support provided, will continue to monitor

## 2012-01-01 MED ORDER — TAMSULOSIN HCL 0.4 MG PO CAPS
0.4000 mg | ORAL_CAPSULE | ORAL | Status: DC
  Administered 2012-01-01 – 2012-01-03 (×5): 0.4 mg via ORAL
  Filled 2012-01-01: qty 1
  Filled 2012-01-01: qty 28
  Filled 2012-01-01 (×6): qty 1
  Filled 2012-01-01: qty 28

## 2012-01-01 NOTE — Progress Notes (Signed)
Patient ID: David Blankenship, male   DOB: 09/03/1986, 26 y.o.   MRN: [id number] Fully alert and cooperative with good eye contact, hygiene is appropriate. He is calm cooperative polite and pleasant. [name] denies any suicidal thoughts, denies any depression. He is rated his anxiety a 1/10. Group participation in activities on the unit are appropriate.  He complains of ongoing difficulties with starting his stream of urine since day before yesterday. Feels his bladder gets full has to work hard to push the urine output and is pretty uncomfortable. I've given instructions today on when to notify the nurse, especially if he cannot start a stream it all becomes uncomfortable.  Meanwhile we discussed starting him on some Flomax twice a day to help facilitate urination.   Plan:  TB Skin test.  Start Flomax .4 mg bid for urinary retention.   Continue other meds and plan to send patient to [facility].

## 2012-01-01 NOTE — Progress Notes (Signed)
Pt going to groups, denies SI/HI. Intrusive at times, silly and superficial but over all redirectable. Pt denies SI/HI. Pt has gone to most groups today. 15 min checks for safety. Pt does not appear to be serious re: treatment.

## 2012-01-01 NOTE — Progress Notes (Signed)
Pt observed on the unit, interacting with peers and staff.  Pt is loud/hyperverbal and silly at times, but redirectable.  His interactions are rather childlike.  He has been attending groups.  He tends to be intrusive with other patients and has to be redirected to avoid any conflict, but he is not aggressive.  Plan for pt is to discharge to [facility] when a bed is available.  Pt started Flomax today for urinary retention.  Again briefly instructed pt on new medication as he has already been given verbal information on the med.  Pt denies SI/HI/AV at this time.  Safety maintained with q15 minute checks.

## 2012-01-01 NOTE — Discharge Planning (Signed)
Met with patient in hall after group, as he did not attend group.  He remains pleasant and engaged, as usual.  Case Manager spoke with RN from [facility] to review case, and patient has been medically cleared there, so now awaits first available bed.  Unable to get through to admissions person, will try again tomorrow.  Did insurance review requesting additional days.  Ambrose Mantle, LCSW 01/01/2012, 4:14 PM

## 2012-01-01 NOTE — Progress Notes (Signed)
Has been visible in milieu and interacting well with peers. Has been silly at times. Is tangential in conversation. States he had a good day. Enjoyed going to gym. Had to be redirected in hallway when peer got agitated r/t his comments, but redirected easily and did not agitate pt for the remainder of the night. Awoke at 2330 r/t peer being disruptive and intially tried to engage pt but returned to be at the request of Clinical research associate. Offered no question or concerns. Safety has been maintained with Q15 minute observation. Will continue current POC.

## 2012-01-02 MED ORDER — TUBERCULIN PPD 5 UNIT/0.1ML ID SOLN
5.0000 [IU] | Freq: Once | INTRADERMAL | Status: AC
  Administered 2012-01-02: 5 [IU] via INTRADERMAL

## 2012-01-02 MED ORDER — HALOPERIDOL 10 MG PO TABS: 10.0000 mg | ORAL_TABLET | Freq: Every day | ORAL | Status: DC

## 2012-01-02 MED ORDER — TRAZODONE HCL 100 MG PO TABS: 200.0000 mg | ORAL_TABLET | Freq: Every day | ORAL | Status: DC

## 2012-01-02 MED ORDER — DIVALPROEX SODIUM ER 500 MG PO TB24: 500.0000 mg | ORAL_TABLET | ORAL | Status: DC

## 2012-01-02 MED ORDER — HALOPERIDOL 5 MG PO TABS: 5.0000 mg | ORAL_TABLET | Freq: Every day | ORAL | Status: DC

## 2012-01-02 MED ORDER — TAMSULOSIN HCL 0.4 MG PO CAPS: 0.4000 mg | ORAL_CAPSULE | ORAL | Status: DC

## 2012-01-02 MED ORDER — IBUPROFEN 800 MG PO TABS: 800.0000 mg | ORAL_TABLET | Freq: Three times a day (TID) | ORAL | Status: AC | PRN

## 2012-01-02 MED ORDER — BENZTROPINE MESYLATE 0.5 MG PO TABS: 0.5000 mg | ORAL_TABLET | ORAL | Status: DC

## 2012-01-02 MED ORDER — BUPROPION HCL ER (XL) 150 MG PO TB24: 150.0000 mg | ORAL_TABLET | Freq: Every day | ORAL | Status: DC
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
## 2012-01-02 NOTE — Progress Notes (Signed)
Pt states he slept well last night. Appetite is good, energy level normal. Pt denies SI/HI, has not be as silly or intrusive today. Polite, cooperative. Pt showing some insight when he writes his goal for after discharge, "To hang around positive people."
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
## 2012-01-02 NOTE — Progress Notes (Signed)
Patient appeared very excited about his discharge plan for tomorrow to [facility]. He reported feeling all right and felt ready for discharge. His mood and affect bright and appropriate. He interacted well with peers and staff; although could be sometimes hyper verbal. He denied SI/HI and denied hallucinations. Patient compliant with medications and attended group. Pt received his PPD test and tolerated the injection. Q 15 minute check continues to maintain safety.

## 2012-01-02 NOTE — Progress Notes (Signed)
[facility] MD Progress Note  01/02/2012 1:47 PM  Diagnosis:  Axis I: Schizoaffective Disorder - Bipolar Type.  Alcohol Abuse.  Cannabis Abuse.   The patient was seen today and reports the following:   ADL's: Intact.  Sleep: The patient reports to sleeping well last night without difficulty.  Appetite: The patient reports a good appetite today.   Mild>(1-10) >Severe  Hopelessness (1-10): 0  Depression (1-10): 0  Anxiety (1-10): 0   Suicidal Ideation: The patient adamantly denies any suicidal ideations today.  Plan: No  Intent: No  Means: No  Homicidal Ideation: The patient adamantly denies any homicidal ideations today.  Plan: No  Intent: No.  Means: No   General Appearance/Behavior: Friendly and cooperative today.  Eye Contact: Good.  Speech: Appropriate in rate and volume with no pressuring today.  Motor Behavior: wnl.  Level of Consciousness: Alert and Oriented x 3.  Mental Status: Alert and Oriented x 3.  Mood: Euthymic.  Affect: Bright and Full.  Anxiety Level: No anxiety reported or noted.  Thought Process: wnl.  Thought Content: The patient denies any auditory and visual hallucinations or delusional thinking.  Perception: wnl.  Judgment: Fair to Good.  Insight: Fair to Good.  Cognition: Oriented to time, place and person.  Sleep:  Number of Hours: 6    Vital Signs:Blood pressure 121/75, pulse 88, temperature 98.5 F (36.9 C), temperature source Oral, resp. rate 18, height 5\' 11"  (1.803 m), weight 66.225 kg (146 lb).  Current Medications: Current Facility-Administered Medications  Medication Dose Route Frequency Provider Last Rate Last Dose  . alum & mag hydroxide-simeth (MAALOX/MYLANTA) 200-200-20 MG/5ML suspension 30 mL  30 mL Oral Q4H PRN Curlene Labrum Orren Pietsch, MD      . benztropine (COGENTIN) tablet 0.5 mg  0.5 mg Oral QHS Curlene Labrum Allie Ousley, MD   0.5 mg at 01/01/12 2135  . benztropine (COGENTIN) tablet 0.5 mg  0.5 mg Oral Q1400 Curlene Labrum Oak Dorey, MD   0.5 mg at  01/01/12 1508  . buPROPion (WELLBUTRIN XL) 24 hr tablet 150 mg  150 mg Oral Daily Curlene Labrum Aijalon Kirtz, MD   150 mg at 01/02/12 0752  . divalproex (DEPAKOTE ER) 24 hr tablet 500 mg  500 mg Oral BH-qamhs Cheyna Retana D Kathern Lobosco, MD   500 mg at 01/02/12 1610  . haloperidol (HALDOL) tablet 10 mg  10 mg Oral QHS Curlene Labrum Judia Arnott, MD   10 mg at 01/01/12 2134  . haloperidol (HALDOL) tablet 5 mg  5 mg Oral Daily Curlene Labrum Acey Woodfield, MD   5 mg at 01/02/12 9604  . hydrOXYzine (ATARAX/VISTARIL) tablet 25 mg  25 mg Oral TID PRN Ronny Bacon, MD   25 mg at 01/01/12 2000  . ibuprofen (ADVIL,MOTRIN) tablet 800 mg  800 mg Oral Q8H PRN Curlene Labrum Sparkles Mcneely, MD   800 mg at 01/01/12 2136  . magnesium hydroxide (MILK OF MAGNESIA) suspension 30 mL  30 mL Oral Daily PRN Curlene Labrum Garry Bochicchio, MD      . Tamsulosin HCl (FLOMAX) capsule 0.4 mg  0.4 mg Oral BH-qamhs Viviann Spare, NP   0.4 mg at 01/02/12 0752  . traZODone (DESYREL) tablet 200 mg  200 mg Oral QHS Curlene Labrum Kylar Speelman, MD   200 mg at 01/01/12 2136   Lab Results: No results found for this or any previous visit (from the past 48 hour(s)).  Time was spent today discussing with the patient his current symptoms.  The patient reports to feeling much better today and feels  ready for transfer to [facility].  This was discussed and the patient agreed for placement at [facility].  Today it was discovered that placement would be possible in the morning and this will be arranged.  Treatment Plan Summary:  1. Daily contact with patient to assess and evaluate symptoms and progress in treatment  2. Medication management  3. The patient will deny suicidal ideations or homicidal ideations for 48 hours prior to discharge and have a depression and anxiety rating of 3 or less. The patient will also deny any auditory or visual hallucinations or delusional thinking.  4. The patient will deny any symptoms of substance withdrawal at the time of discharge.   Plan:  1. Will continue the patient on his current  medications.  2. Will continue to monitor.  3. The patient will be transferred to [facility] in the morning for further treatment of his substance abuse related issues.  Fount Bahe 01/02/2012, 1:47 PM

## 2012-01-03 NOTE — Progress Notes (Signed)
Pt D/ced to [facility]. Medication samples sent with pt. Pt denies SI/HI, denies A/V hallucinations. Mood stable.

## 2012-01-03 NOTE — Progress Notes (Signed)
Pt to be D/Ced to [facility] this am. Pt denies SI/HI, seems very excited about this discharge.

## 2012-01-03 NOTE — Progress Notes (Signed)
[facility] Case Management Discharge Plan:  Will you be returning to the same living situation after discharge: No. At discharge, do you have transportation home?:No. Do you have the ability to pay for your medications:No.  Interagency Information:     Release of information consent forms completed and in the chart;  Patient's signature needed at discharge.  Patient to Follow up at:  Follow-up Information    Follow up with [facility] on 01/03/2012. (Transfer to state facility)          Patient denies SI/HI:   Yes,      Safety Planning and Suicide Prevention discussed:  Yes,  During Aftercare Planning Group, Case Manager provided psychoeducation on "Suicide Prevention Information."  This included descriptions of risk factors for suicide, warning signs that an individual is in crisis and thinking of suicide, and what to do if this occurs.  Pt indicated understanding of information provided, and will read brochure given upon discharge.     Barrier to discharge identified:No.  Summary and Recommendations:  Transfer to [facility] for drug/alcohol rehab.   Sarina Ser 01/03/2012, 12:58 PM

## 2012-01-03 NOTE — [facility] Suicide Risk Assessment (Signed)
Suicide Risk Assessment  Discharge Assessment     Demographic factors:  Assessment Details Time of Assessment: Admission Information Obtained From: Patient Current Mental Status:  AO x 3. Risk Reduction Factors:  Risk Reduction Factors: Responsible for children under 25 years of age;Sense of responsibility to family;Employed  CLINICAL FACTORS:   Alcohol/Substance Abuse/Dependencies More than one psychiatric diagnosis Previous Psychiatric Diagnoses and Treatments Schizoaffective Disorder - Bipolar Type.  COGNITIVE FEATURES THAT CONTRIBUTE TO RISK:  None Noted.  Diagnosis:  Axis I: Schizoaffective Disorder - Bipolar Type.  Alcohol Abuse.  Cannabis Abuse.   The patient was seen today and reports the following:   ADL's: Intact.  Sleep: The patient reports to continuing to sleep well last night without difficulty.  Appetite: The patient continues to report a good appetite today.   Mild>(1-10) >Severe  Hopelessness (1-10): 0  Depression (1-10): 0  Anxiety (1-10): 0   Suicidal Ideation: The patient adamantly denies any suicidal ideations today.  Plan: No  Intent: No  Means: No  Homicidal Ideation: The patient adamantly denies any homicidal ideations today.  Plan: No  Intent: No.  Means: No   General Appearance/Behavior: Remains friendly and cooperative today.  Eye Contact: Good.  Speech: Appropriate in rate and volume with no pressuring today.  Motor Behavior: wnl.  Level of Consciousness: Alert and Oriented x 3.  Mental Status: Alert and Oriented x 3.  Mood: Euthymic.  Affect: Bright and Full.  Anxiety Level: No anxiety reported or noted.  Thought Process: wnl.  Thought Content: The patient denies any auditory and visual hallucinations or delusional thinking.  Perception: wnl.  Judgment: Good.  Insight: Good.  Cognition: Oriented to time, place and person.   Lab Results: No results found for this or any previous visit (from the past 48 hour(s)).   Time was  spent today discussing with the patient his current symptoms. The patient continues to report to doing well and is looking forward to being transferred today to [facility] for further treatment of his substance abuse issues.  The patient reports that he is grateful to staff for the care he has received.  He has no concerns or complaints today and will be transferred as planned.  Treatment Plan Summary:  1. Daily contact with patient to assess and evaluate symptoms and progress in treatment  2. Medication management  3. The patient will deny suicidal ideations or homicidal ideations for 48 hours prior to discharge and have a depression and anxiety rating of 3 or less. The patient will also deny any auditory or visual hallucinations or delusional thinking.  4. The patient will deny any symptoms of substance withdrawal at the time of discharge.   Plan:  1. Will continue the patient on his current medications.  2. Will continue to monitor.  3. The patient will be transferred to [facility] this morning for further treatment of his substance abuse related issues.  SUICIDE RISK:   Minimal: No identifiable suicidal ideation.  Patients presenting with no risk factors but with morbid ruminations; may be classified as minimal risk based on the severity of the depressive symptoms  David Blankenship 01/03/2012, 8:24 AM

## 2012-01-04 NOTE — Progress Notes (Signed)
Adult Services Patient-Family Contact/Session  Attendees:  Patient's grandmother, Lorre Munroe):  Discharge planning Late Entry 01/02/12  Safety Concerns:  none  Narrative:  Informed grandmother that patient should be going to [facility] in the next few days. Asked how they would provide transportation. She reported they did not have transportation for him. She is taking care of sick elderly mother and is in [place]. Hoping that sheriff could take him. Also questioned how to get patient's belonging from the shelter.  Barrier(s):  Lack of transportation  Interventions:  Discussed discharge planning  Recommendation(s):  [facility]  Follow-up Required:  No  Explanation:    [name] 01/04/2012, 2:38 PM

## 2012-01-04 NOTE — Progress Notes (Signed)
[facility] Group Notes:  (Counselor/Nursing/MHT/Case Management/Adjunct)  01/04/2012 2:41 PM  Type of Therapy:  Group Therapy  Summary of Progress/Problems: Patient has been attending groups on [address] for [facility].   [name] 01/04/2012, 2:41 PM

## 2012-01-08 NOTE — Progress Notes (Signed)
Patient Discharge Instructions:  Psychiatric Admission Assessment Note Provided,  01/02/2012 Discharge Summary Note Provided,   01/08/2012 After Visit Summary (AVS) Provided,  01/08/2012 Face Sheet Provided, 01/08/2012 Faxed/Sent to the Next Level Care provider:  01/02/2012  Faxed to [facility] @ [phone]  Wandra Scot, 01/08/2012, 4:39 PM

## 2012-10-07 ENCOUNTER — Other Ambulatory Visit: Payer: Self-pay | Admitting: Internal Medicine

## 2012-10-07 ENCOUNTER — Ambulatory Visit (INDEPENDENT_AMBULATORY_CARE_PROVIDER_SITE_OTHER): Payer: Medicaid Other | Admitting: Gastroenterology

## 2012-10-07 ENCOUNTER — Encounter: Payer: Self-pay | Admitting: Gastroenterology

## 2012-10-07 VITALS — BP 105/62 | HR 72 | Temp 97.0°F | Ht 72.0 in | Wt 144.0 lb

## 2012-10-07 DIAGNOSIS — K6289 Other specified diseases of anus and rectum: Secondary | ICD-10-CM

## 2012-10-07 DIAGNOSIS — R630 Anorexia: Secondary | ICD-10-CM | POA: Insufficient documentation

## 2012-10-07 DIAGNOSIS — R634 Abnormal weight loss: Secondary | ICD-10-CM

## 2012-10-07 DIAGNOSIS — K625 Hemorrhage of anus and rectum: Secondary | ICD-10-CM

## 2012-10-07 DIAGNOSIS — R112 Nausea with vomiting, unspecified: Secondary | ICD-10-CM

## 2012-10-07 DIAGNOSIS — R1110 Vomiting, unspecified: Secondary | ICD-10-CM | POA: Insufficient documentation

## 2012-10-07 MED ORDER — PEG 3350-KCL-NA BICARB-NACL 420 G PO SOLR: 4000.0000 mL | ORAL | Status: DC

## 2012-10-07 NOTE — Progress Notes (Signed)
Received blood work from 08/25/2012. White blood cell 4300, hemoglobin 13.9, hematocrit 39.1, MCV 92.4, platelets 82,000, total bilirubin 0.7, alkaline phosphatase 44, AST 35, ALT 26, albumin 4.2, valproic acid 146 high.  1. Recommend EGD/TCS with [facility]. Dx: rectal pain/rectal bleeding, anorexia, n/v, weight loss. 2. Give phenergan 25mg  IV 30 minutes before procedure to augment conscious sedation. 3. Need further labs. CBC ([id number], weight loss), TSH and free T4 (dx: weight loss)

## 2012-10-07 NOTE — Progress Notes (Signed)
Patient is scheduled with [facility] on 10/28/12 and I have talked to [facility] and I have mailed her his instructions

## 2012-10-07 NOTE — Assessment & Plan Note (Signed)
History of rectal bleeding and rectal pain. Patient reports hard to loose stool, history unreliable. Soft stool present on exam, heme negative. Patient screamed with DRE but not clear if due to pain or psychiatric illness. Will likely offer him a colonoscopy in near future. Discussed prep with staff who feel patient would be able to tolerate. Further recommendations to follow.

## 2012-10-07 NOTE — Progress Notes (Signed)
Primary Care Physician:  FANTA,TESFAYE, MD  Primary Gastroenterologist:  Michael Rourk, MD   Chief Complaint  Patient presents with  . Weight Loss  . rectal pain when he has a BM and burning    HPI:  David Blankenship is a 26 y.o. male here for further evaluation of anorexia, n/v, weight loss, rectal pain and brbpr.   Patient resides at [facility]. He has h/o bipolar disorder, schizophrenia, ADHD and has had multiple psychiatric admissions since 2005. H/O suicidal and homicidal ideations, cutting wrist in the past. Most recent  admission for homicidal thoughts in 12/2011. Started on Depakote for mood stabilzation, Wellbutrin for poor concentration and depression, Haldol for agitated thoughts. Prior to that admission, he had history of etoh/marijuana abuse. Patient reported residing at [facility] since 01/2012. Staff reports that he does not have access to drugs/etoh.  Most of history provided by staff. Confirmed by patient but history may be somewhat reliable. He has at least one month history of diminished appetite, intermittent vomiting, 10 pound weight loss. Feels hungry but smells make him sick. No abdominal pain. BM 3-4 times per day. Stools hard to soft per patient. BRBPR. Rectal pain with BM. Blood in urine for about one month. No heartburn. Labs in 08/2012 by Dr. Fanta, we have requested copy.       Current Outpatient Prescriptions  Medication Sig Dispense Refill  . benztropine (COGENTIN) 1 MG tablet Take 0.5 mg by mouth 2 (two) times daily.      . buPROPion (WELLBUTRIN XL) 150 MG 24 hr tablet Take 1 tablet (150 mg total) by mouth daily. For depression.      . divalproex (DEPAKOTE ER) 500 MG 24 hr tablet Takes 3 tablets bid   8am and 8pm   For mood stability.      . haloperidol (HALDOL) 5 MG tablet Take 3 tablets at bedtime      . ranitidine (ZANTAC) 150 MG tablet Take 150 mg by mouth 2 (two) times daily.      . Tamsulosin HCl (FLOMAX) 0.4 MG CAPS Take 1 capsule (0.4 mg total)  by mouth 2 (two) times daily in the am and at bedtime.. For urinary hesitancy.      . traZODone (DESYREL) 100 MG tablet Take 100 mg by mouth at bedtime.        Allergies as of 10/07/2012 - Review Complete 10/07/2012  Allergen Reaction Noted  . Acetaminophen Hives 12/25/2011  . Shellfish allergy Hives 12/25/2011    Past Medical History  Diagnosis Date  . Asthma   . Seizures   . Bipolar 1 disorder   . Schizophrenia   . ADHD (attention deficit hyperactivity disorder)     Past Surgical History  Procedure Date  . None     Family History  Problem Relation Age of Onset  . Seizures Father   . Asthma Other   . Seizures Other   . Cancer Other     aunt    History   Social History  . Marital Status: Single    Spouse Name: N/A    Number of Children: N/A  . Years of Education: N/A   Occupational History  . Not on file.   Social History Main Topics  . Smoking status: Current Every Day Smoker -- 0.5 packs/day for 12 years    Types: Cigarettes  . Smokeless tobacco: Not on file     Comment: 5 or 6 cigarettes daily  . Alcohol Use: 10.2 oz/week    12   Cans of beer, 5 Shots of liquor per week     Comment: Quit drinking alcoholic beverages. No etoh since 01/2012  . Drug Use: Yes    Special: Marijuana     Comment: none since 01/2012  . Sexually Active: Yes    Birth Control/ Protection: Condom   Other Topics Concern  . Not on file   Social History Narrative  . No narrative on file      ROS: ?reliability of history   General: see hpi. Negative for fever, chills, fatigue, weakness. Eyes: Negative for vision changes.  ENT: Negative for hoarseness, difficulty swallowing , nasal congestion. CV: Negative for chest pain, angina, palpitations, dyspnea on exertion, peripheral edema.  Respiratory: Negative for dyspnea at rest, dyspnea on exertion, cough, sputum, wheezing.  GI: See history of present illness. GU:  Negative for dysuria, urinary incontinence, urinary frequency,  nocturnal urination. C/o gross hematuria.  MS: Negative for joint pain, low back pain.  Derm: Negative for rash or itching.  Neuro: Negative for weakness, abnormal sensation, seizure, frequent headaches, memory loss, confusion.  Psych: see hpi. Denies homicidal/suicidal thoughts. Endo: see hpi. Heme: Negative for bruising or bleeding. Allergy: Negative for rash or hives.    Physical Examination:  BP 105/62  Pulse 72  Temp 97 F (36.1 C) (Oral)  Ht 6' (1.829 m)  Wt 144 lb (65.318 kg)  BMI 19.53 kg/m2   General: Well-nourished, well-developed in no acute distress. Accompanied by staff. Patient cooperative. Answers some basic questions.  Head: Normocephalic, atraumatic.   Eyes: Conjunctiva pink, no icterus. Mouth: Oropharyngeal mucosa moist and pink , no lesions erythema or exudate. Neck: Supple without thyromegaly, masses, or lymphadenopathy.  Lungs: Clear to auscultation bilaterally.  Heart: Regular rate and rhythm, no murmurs rubs or gallops.  Abdomen: Bowel sounds are normal, some diffuse lower abdominal tenderness (mild), nondistended, no hepatosplenomegaly or masses, no abdominal bruits or    hernia , no rebound or guarding.   Rectal: stool present around anal opening, brown, DRE without abnormality but patient screamed with internal exam. Stool heme negative.  Extremities: No lower extremity edema. No clubbing or deformities.  Neuro: Alert and oriented x 4 , grossly normal neurologically.  Skin: Warm and dry, no rash or jaundice.   Psych: Alert and cooperative.  Labs: OLD LABS Lab Results  Component Value Date   CREATININE 1.00 12/27/2011   BUN 14 12/27/2011   NA 139 12/27/2011   K 4.3 12/27/2011   CL 103 12/27/2011   CO2 28 12/27/2011   Lab Results  Component Value Date   ALT 10 12/27/2011   AST 17 12/27/2011   ALKPHOS 60 12/27/2011   BILITOT 0.4 12/27/2011   Lab Results  Component Value Date   WBC 7.2 12/27/2011   HGB 13.7 12/27/2011   HCT 40.3 12/27/2011   MCV 86.1 12/27/2011    PLT 229 12/27/2011   Lab Results  Component Value Date   TSH 1.485 12/27/2011   UDS 12/2011: positive for tetrahydrocannabinol  Imaging Studies: No results found.    

## 2012-10-07 NOTE — Patient Instructions (Addendum)
We will obtain copy of last labs. Further orders to follow.

## 2012-10-07 NOTE — Assessment & Plan Note (Signed)
Report of 10 pound weight loss, anorexia, intermittent vomiting over past one month. Retrieve copy of last labs ordered by Dr. Felecia Shelling. Given psychiatric issues, it is difficult to get history from patient. Symptoms could be secondary to PUD, gastritis, medications, gallbladder, etc. Once labs have been reviewed, further orders to follow.

## 2012-10-09 ENCOUNTER — Other Ambulatory Visit: Payer: Self-pay

## 2012-10-09 ENCOUNTER — Other Ambulatory Visit: Payer: Self-pay | Admitting: Gastroenterology

## 2012-10-09 DIAGNOSIS — D696 Thrombocytopenia, unspecified: Secondary | ICD-10-CM

## 2012-10-09 DIAGNOSIS — R634 Abnormal weight loss: Secondary | ICD-10-CM

## 2012-10-09 NOTE — Progress Notes (Signed)
LM for [facility] to call.

## 2012-10-10 NOTE — Progress Notes (Signed)
Faxed copy of lab orders to [facility]. Meriam Sprague called back and said she would take him to the lab today.

## 2012-10-20 ENCOUNTER — Encounter (HOSPITAL_COMMUNITY): Payer: Self-pay | Admitting: Pharmacy Technician

## 2012-10-28 ENCOUNTER — Ambulatory Visit (HOSPITAL_COMMUNITY)
Admission: RE | Admit: 2012-10-28 | Discharge: 2012-10-28 | Disposition: A | Discharge status: Home / Self Care | Payer: Medicaid Other | Source: Ambulatory Visit | Attending: Internal Medicine | Admitting: Internal Medicine

## 2012-10-28 ENCOUNTER — Encounter (HOSPITAL_COMMUNITY)
Admission: RE | Disposition: A | Discharge status: Home / Self Care | Payer: Self-pay | Source: Ambulatory Visit | Attending: Internal Medicine

## 2012-10-28 ENCOUNTER — Encounter (HOSPITAL_COMMUNITY): Payer: Self-pay

## 2012-10-28 DIAGNOSIS — R112 Nausea with vomiting, unspecified: Secondary | ICD-10-CM

## 2012-10-28 DIAGNOSIS — R634 Abnormal weight loss: Secondary | ICD-10-CM

## 2012-10-28 DIAGNOSIS — K625 Hemorrhage of anus and rectum: Secondary | ICD-10-CM

## 2012-10-28 DIAGNOSIS — K648 Other hemorrhoids: Secondary | ICD-10-CM | POA: Insufficient documentation

## 2012-10-28 DIAGNOSIS — K6289 Other specified diseases of anus and rectum: Secondary | ICD-10-CM

## 2012-10-28 DIAGNOSIS — K2990 Gastroduodenitis, unspecified, without bleeding: Secondary | ICD-10-CM | POA: Insufficient documentation

## 2012-10-28 DIAGNOSIS — K2970 Gastritis, unspecified, without bleeding: Secondary | ICD-10-CM | POA: Insufficient documentation

## 2012-10-28 DIAGNOSIS — K921 Melena: Secondary | ICD-10-CM | POA: Insufficient documentation

## 2012-10-28 DIAGNOSIS — K2100 Gastro-esophageal reflux disease with esophagitis, without bleeding: Secondary | ICD-10-CM | POA: Insufficient documentation

## 2012-10-28 DIAGNOSIS — R630 Anorexia: Secondary | ICD-10-CM

## 2012-10-28 DIAGNOSIS — K228 Other specified diseases of esophagus: Secondary | ICD-10-CM

## 2012-10-28 HISTORY — PX: COLONOSCOPY WITH ESOPHAGOGASTRODUODENOSCOPY (EGD): SHX5779

## 2012-10-28 SURGERY — COLONOSCOPY WITH ESOPHAGOGASTRODUODENOSCOPY (EGD)
Anesthesia: Moderate Sedation

## 2012-10-28 MED ORDER — STERILE WATER FOR IRRIGATION IR SOLN
Status: DC | PRN
  Administered 2012-10-28: 09:00:00

## 2012-10-28 MED ORDER — MEPERIDINE HCL 100 MG/ML IJ SOLN
INTRAMUSCULAR | Status: AC
  Filled 2012-10-28: qty 2

## 2012-10-28 MED ORDER — SODIUM CHLORIDE 0.45 % IV SOLN
INTRAVENOUS | Status: DC
  Administered 2012-10-28: 08:00:00 via INTRAVENOUS

## 2012-10-28 MED ORDER — PROMETHAZINE HCL 25 MG/ML IJ SOLN
INTRAMUSCULAR | Status: AC
  Filled 2012-10-28: qty 1

## 2012-10-28 MED ORDER — PROMETHAZINE HCL 25 MG/ML IJ SOLN
25.0000 mg | Freq: Once | INTRAMUSCULAR | Status: AC
  Administered 2012-10-28: 25 mg via INTRAVENOUS

## 2012-10-28 MED ORDER — SODIUM CHLORIDE 0.9 % IJ SOLN
INTRAMUSCULAR | Status: AC
  Filled 2012-10-28: qty 10

## 2012-10-28 MED ORDER — MIDAZOLAM HCL 5 MG/5ML IJ SOLN
INTRAMUSCULAR | Status: AC
  Filled 2012-10-28: qty 10

## 2012-10-28 MED ORDER — MEPERIDINE HCL 100 MG/ML IJ SOLN
INTRAMUSCULAR | Status: DC | PRN
  Administered 2012-10-28: 50 mg via INTRAVENOUS
  Administered 2012-10-28: 25 mg via INTRAVENOUS

## 2012-10-28 MED ORDER — MIDAZOLAM HCL 5 MG/5ML IJ SOLN
INTRAMUSCULAR | Status: DC | PRN
  Administered 2012-10-28 (×2): 2 mg via INTRAVENOUS

## 2012-10-28 MED ORDER — BUTAMBEN-TETRACAINE-BENZOCAINE 2-2-14 % EX AERO
INHALATION_SPRAY | CUTANEOUS | Status: DC | PRN
  Administered 2012-10-28: 2 via TOPICAL

## 2012-10-28 NOTE — Interval H&P Note (Signed)
History and Physical Interval Note:  10/28/2012 9:01 AM  David Blankenship  has presented today for surgery, with the diagnosis of RECTAL PAIN, RECTAL BLEEDING, ANOREXIA, NAUSEA & VOMITING, WEIGHT LOSS  The various methods of treatment have been discussed with the patient and family. After consideration of risks, benefits and other options for treatment, the patient has consented to  Procedure(s) (LRB) with comments: COLONOSCOPY WITH ESOPHAGOGASTRODUODENOSCOPY (EGD) (N/A) - 11:15 as a surgical intervention .  The patient's history has been reviewed, patient examined, no change in status, stable for surgery.  I have reviewed the patient's chart and labs.  Questions were answered to the patient's satisfaction.     Eula Listen  As above. EGD and colonoscopy per plan. The risks, benefits, limitations, imponderables and alternatives regarding both EGD and colonoscopy have been reviewed with the patient. Questions have been answered. All parties agreeable.

## 2012-10-28 NOTE — Op Note (Signed)
[facility] [address] [place], [address]   ENDOSCOPY PROCEDURE REPORT  PATIENT: David Blankenship, David Blankenship  MR#: [id number] BIRTHDATE: June 09, 1986 , 26  yrs. old GENDER: Male ENDOSCOPIST: R.  Roetta Sessions, MD FACP FACG REFERRED BY:  Glenice Laine, M.D. PROCEDURE DATE:  10/28/2012 PROCEDURE:     EGD with gastric biopsy  INDICATIONS:      Anorexia; weight loss; patient also noted to have thrombocytopenia  INFORMED CONSENT:   The risks, benefits, limitations, alternatives and imponderables have been discussed.  The potential for biopsy, esophogeal dilation, etc. have also been reviewed.  Questions have been answered.  All parties agreeable.  Please see the history and physical in the medical record for more information.  MEDICATIONS:      Demerol 75 mg IV and Versed 4 mg IV in divided doses. Cetacaine spray. Phenergan 25 mg IV  DESCRIPTION OF PROCEDURE:   The Pentax Gastroscope [id number] endoscope was introduced through the mouth and advanced to the second portion of the duodenum without difficulty or limitations. The mucosal surfaces were surveyed very carefully during advancement of the scope and upon withdrawal.  Retroflexion view of the proximal stomach and esophagogastric junction was performed.      FINDINGS: Single tiny distal esophageal erosion otherwise esophagus appeared normal. Stomach empty. Diffuse "snake skiing or "fish scale" appearance of the gastric mucosa. No ulcer or infiltrating process. Small hiatal hernia. Patent pylorus. Normal first and second portion of the duodenum.  THERAPEUTIC / DIAGNOSTIC MANEUVERS PERFORMED:  Biopsies the abnormal-appearing gastric mucosa taken for histologic study   COMPLICATIONS:  None  IMPRESSION: Single tiny distal esophageal erosions consistent with mild erosive reflux esophagitis. Hiatal hernia. 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 Followup on pathology. See colonoscopy report    _______________________________ R. Roetta Sessions, MD FACP [facility] eSigned:  R. Roetta Sessions, MD FACP [facility] 10/28/2012 9:28 AM     CC:

## 2012-10-28 NOTE — H&P (View-Only) (Signed)
Primary Care Physician:  Avon Gully, MD  Primary Gastroenterologist:  Roetta Sessions, MD   Chief Complaint  Patient presents with  . Weight Loss  . rectal pain when he has a BM and burning    HPI:  David Blankenship is a 27 y.o. male here for further evaluation of anorexia, n/v, weight loss, rectal pain and brbpr.   Patient resides at [facility]. He has h/o bipolar disorder, schizophrenia, ADHD and has had multiple psychiatric admissions since 2005. H/O suicidal and homicidal ideations, cutting wrist in the past. Most recent  admission for homicidal thoughts in 12/2011. Started on Depakote for mood stabilzation, Wellbutrin for poor concentration and depression, Haldol for agitated thoughts. Prior to that admission, he had history of etoh/marijuana abuse. Patient reported residing at [facility] since 01/2012. Staff reports that he does not have access to drugs/etoh.  Most of history provided by staff. Confirmed by patient but history may be somewhat reliable. He has at least one month history of diminished appetite, intermittent vomiting, 10 pound weight loss. Feels hungry but smells make him sick. No abdominal pain. BM 3-4 times per day. Stools hard to soft per patient. BRBPR. Rectal pain with BM. Blood in urine for about one month. No heartburn. Labs in 08/2012 by Dr. Felecia Shelling, we have requested copy.       Current Outpatient Prescriptions  Medication Sig Dispense Refill  . benztropine (COGENTIN) 1 MG tablet Take 0.5 mg by mouth 2 (two) times daily.      Marland Kitchen buPROPion (WELLBUTRIN XL) 150 MG 24 hr tablet Take 1 tablet (150 mg total) by mouth daily. For depression.      . divalproex (DEPAKOTE ER) 500 MG 24 hr tablet Takes 3 tablets bid   8am and 8pm   For mood stability.      . haloperidol (HALDOL) 5 MG tablet Take 3 tablets at bedtime      . ranitidine (ZANTAC) 150 MG tablet Take 150 mg by mouth 2 (two) times daily.      . Tamsulosin HCl (FLOMAX) 0.4 MG CAPS Take 1 capsule (0.4 mg total)  by mouth 2 (two) times daily in the am and at bedtime.. For urinary hesitancy.      . traZODone (DESYREL) 100 MG tablet Take 100 mg by mouth at bedtime.        Allergies as of 10/07/2012 - Review Complete 10/07/2012  Allergen Reaction Noted  . Acetaminophen Hives 12/25/2011  . Shellfish allergy Hives 12/25/2011    Past Medical History  Diagnosis Date  . Asthma   . Seizures   . Bipolar 1 disorder   . Schizophrenia   . ADHD (attention deficit hyperactivity disorder)     Past Surgical History  Procedure Date  . None     Family History  Problem Relation Age of Onset  . Seizures Father   . Asthma Other   . Seizures Other   . Cancer Other     aunt    History   Social History  . Marital Status: Single    Spouse Name: N/A    Number of Children: N/A  . Years of Education: N/A   Occupational History  . Not on file.   Social History Main Topics  . Smoking status: Current Every Day Smoker -- 0.5 packs/day for 12 years    Types: Cigarettes  . Smokeless tobacco: Not on file     Comment: 5 or 6 cigarettes daily  . Alcohol Use: 10.2 oz/week    12  Cans of beer, 5 Shots of liquor per week     Comment: Quit drinking alcoholic beverages. No etoh since 01/2012  . Drug Use: Yes    Special: Marijuana     Comment: none since 01/2012  . Sexually Active: Yes    Birth Control/ Protection: Condom   Other Topics Concern  . Not on file   Social History Narrative  . No narrative on file      ROS: ?reliability of history   General: see hpi. Negative for fever, chills, fatigue, weakness. Eyes: Negative for vision changes.  ENT: Negative for hoarseness, difficulty swallowing , nasal congestion. CV: Negative for chest pain, angina, palpitations, dyspnea on exertion, peripheral edema.  Respiratory: Negative for dyspnea at rest, dyspnea on exertion, cough, sputum, wheezing.  GI: See history of present illness. GU:  Negative for dysuria, urinary incontinence, urinary frequency,  nocturnal urination. C/o gross hematuria.  MS: Negative for joint pain, low back pain.  Derm: Negative for rash or itching.  Neuro: Negative for weakness, abnormal sensation, seizure, frequent headaches, memory loss, confusion.  Psych: see hpi. Denies homicidal/suicidal thoughts. Endo: see hpi. Heme: Negative for bruising or bleeding. Allergy: Negative for rash or hives.    Physical Examination:  BP 105/62  Pulse 72  Temp 97 F (36.1 C) (Oral)  Ht 6' (1.829 m)  Wt 144 lb (65.318 kg)  BMI 19.53 kg/m2   General: Well-nourished, well-developed in no acute distress. Accompanied by staff. Patient cooperative. Answers some basic questions.  Head: Normocephalic, atraumatic.   Eyes: Conjunctiva pink, no icterus. Mouth: Oropharyngeal mucosa moist and pink , no lesions erythema or exudate. Neck: Supple without thyromegaly, masses, or lymphadenopathy.  Lungs: Clear to auscultation bilaterally.  Heart: Regular rate and rhythm, no murmurs rubs or gallops.  Abdomen: Bowel sounds are normal, some diffuse lower abdominal tenderness (mild), nondistended, no hepatosplenomegaly or masses, no abdominal bruits or    hernia , no rebound or guarding.   Rectal: stool present around anal opening, brown, DRE without abnormality but patient screamed with internal exam. Stool heme negative.  Extremities: No lower extremity edema. No clubbing or deformities.  Neuro: Alert and oriented x 4 , grossly normal neurologically.  Skin: Warm and dry, no rash or jaundice.   Psych: Alert and cooperative.  Labs: OLD LABS Lab Results  Component Value Date   CREATININE 1.00 12/27/2011   BUN 14 12/27/2011   NA 139 12/27/2011   K 4.3 12/27/2011   CL 103 12/27/2011   CO2 28 12/27/2011   Lab Results  Component Value Date   ALT 10 12/27/2011   AST 17 12/27/2011   ALKPHOS 60 12/27/2011   BILITOT 0.4 12/27/2011   Lab Results  Component Value Date   WBC 7.2 12/27/2011   HGB 13.7 12/27/2011   HCT 40.3 12/27/2011   MCV 86.1 12/27/2011    PLT 229 12/27/2011   Lab Results  Component Value Date   TSH 1.485 12/27/2011   UDS 12/2011: positive for tetrahydrocannabinol  Imaging Studies: No results found.
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
## 2012-10-28 NOTE — OR Nursing (Signed)
Spoke with Dewayne Hatch, Child psychotherapist about what Mr Fendley stated. She said she would contact [employer]

## 2012-10-28 NOTE — Op Note (Signed)
[facility] [address] [place], [address]   COLONOSCOPY PROCEDURE REPORT  PATIENT: David Blankenship, David Blankenship  MR#:         [id number] BIRTHDATE: 1986/02/24 , 26  yrs. old GENDER: Male ENDOSCOPIST: R.  Roetta Sessions, MD FACP FACG REFERRED BY:  Glenice Laine, M.D. PROCEDURE DATE:  10/28/2012 PROCEDURE:     colonoscopy-diagnostic  INDICATIONS:  hematochezia; proctalgia  INFORMED CONSENT:  The risks, benefits, alternatives and imponderables including but not limited to bleeding, perforation as well as the possibility of a missed lesion have been reviewed.  The potential for biopsy, lesion removal, etc. have also been discussed.  Questions have been answered.  All parties agreeable. Please see the history and physical in the medical record for more information.  MEDICATIONS: Versed 4 mg IV and Demerol 75 mg IV in divided doses. Phenergan 25 mg IV  DESCRIPTION OF PROCEDURE:  After a digital rectal exam was performed, the Pentax Colonoscope [id number]  colonoscope was advanced from the anus through the rectum and colon to the area of the cecum, ileocecal valve and appendiceal orifice.  The cecum was deeply intubated.  These structures were well-seen and photographed for the record.  From the level of the cecum and ileocecal valve, the scope was slowly and cautiously withdrawn.  The mucosal surfaces were carefully surveyed utilizing scope tip deflection to facilitate fold flattening as needed.  The scope was pulled down into the rectum where a thorough examination including retroflexion was performed.    FINDINGS:  Adequate preparation. Anal canal hemorrhoids and single anal papilla; otherwise, normal rectum. Normal. Colonic mucosa.  THERAPEUTIC / DIAGNOSTIC MANEUVERS PERFORMED:  None  COMPLICATIONS: None  CECAL WITHDRAWAL TIME:  8 minutes  IMPRESSION:  Hemorrhoids and anal papilla; otherwise, normal rectum and colon.  RECOMMENDATIONS: Course of Anusol  suppositories. See EGD report   _______________________________ eSigned:  R. Roetta Sessions, MD FACP [facility] 10/28/2012 9:53 AM   CC:
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
## 2012-10-30 ENCOUNTER — Encounter (HOSPITAL_COMMUNITY): Payer: Self-pay | Admitting: Internal Medicine

## 2012-10-30 ENCOUNTER — Encounter: Payer: Self-pay | Admitting: *Deleted

## 2012-10-30 ENCOUNTER — Encounter: Payer: Self-pay | Admitting: Internal Medicine

## 2012-10-30 ENCOUNTER — Telehealth: Payer: Self-pay

## 2012-10-30 ENCOUNTER — Other Ambulatory Visit: Payer: Self-pay | Admitting: Internal Medicine

## 2012-10-30 DIAGNOSIS — D696 Thrombocytopenia, unspecified: Secondary | ICD-10-CM

## 2012-10-30 NOTE — Telephone Encounter (Signed)
Letter from: Corbin Ade Reason for Letter: Results Review Send letter to patient.  Send copy of letter with path to referring provider and PCP.   Please schedule abdominal ultrasound to evaluate liver and spleen with thrombocytopenia

## 2012-10-30 NOTE — Telephone Encounter (Signed)
Patient is scheduled for Abd U/S on Thurs Jan 16th at 8:00 am and I have LMOM for [facility] to call back and confirm date & time

## 2012-10-30 NOTE — Telephone Encounter (Signed)
Path and letter faxed to PCP, letter mailed to pt 

## 2012-11-06 ENCOUNTER — Ambulatory Visit (HOSPITAL_COMMUNITY)
Admission: RE | Admit: 2012-11-06 | Discharge: 2012-11-06 | Disposition: A | Discharge status: Home / Self Care | Payer: Medicaid Other | Source: Ambulatory Visit | Attending: Internal Medicine | Admitting: Internal Medicine

## 2012-11-06 DIAGNOSIS — D696 Thrombocytopenia, unspecified: Secondary | ICD-10-CM

## 2012-11-17 ENCOUNTER — Telehealth: Payer: Self-pay | Admitting: Urgent Care

## 2012-11-17 NOTE — Telephone Encounter (Signed)
Please remind caregivers that pt needs labs drawn as per LSL's recommendations. Thanks

## 2012-11-24 NOTE — Telephone Encounter (Signed)
Called [facility] lab to check on results. They do not have any results for this pt. Faxed letter and copy of lab orders to [employer].

## 2012-11-26 LAB — CBC WITH DIFFERENTIAL/PLATELET
Basophils Absolute: 0 10*3/uL (ref 0.0–0.1)
Lymphocytes Relative: 42 % (ref 12–46)
Lymphs Abs: 2 10*3/uL (ref 0.7–4.0)
MCV: 94.7 fL (ref 78.0–100.0)
Neutro Abs: 1.9 10*3/uL (ref 1.7–7.7)
Neutrophils Relative %: 40 % — ABNORMAL LOW (ref 43–77)
Platelets: 140 10*3/uL — ABNORMAL LOW (ref 150–400)
RBC: 3.56 MIL/uL — ABNORMAL LOW (ref 4.22–5.81)
RDW: 16.9 % — ABNORMAL HIGH (ref 11.5–15.5)
WBC: 4.7 10*3/uL (ref 4.0–10.5)

## 2012-11-26 LAB — T4, FREE: Free T4: 1.27 ng/dL (ref 0.80–1.80)

## 2012-11-27 LAB — TSH: TSH: 6.017 u[IU]/mL — ABNORMAL HIGH (ref 0.350–4.500)

## 2012-11-28 ENCOUNTER — Encounter (HOSPITAL_COMMUNITY): Payer: Self-pay | Admitting: *Deleted

## 2012-11-28 ENCOUNTER — Emergency Department (HOSPITAL_COMMUNITY)
Admission: EM | Admit: 2012-11-28 | Discharge: 2012-11-28 | Disposition: A | Discharge status: Home / Self Care | Payer: Medicaid Other | Attending: Emergency Medicine | Admitting: Emergency Medicine

## 2012-11-28 ENCOUNTER — Emergency Department (HOSPITAL_COMMUNITY): Payer: Medicaid Other

## 2012-11-28 DIAGNOSIS — Z8659 Personal history of other mental and behavioral disorders: Secondary | ICD-10-CM | POA: Insufficient documentation

## 2012-11-28 DIAGNOSIS — Z79899 Other long term (current) drug therapy: Secondary | ICD-10-CM | POA: Insufficient documentation

## 2012-11-28 DIAGNOSIS — F319 Bipolar disorder, unspecified: Secondary | ICD-10-CM | POA: Insufficient documentation

## 2012-11-28 DIAGNOSIS — S0990XA Unspecified injury of head, initial encounter: Secondary | ICD-10-CM | POA: Insufficient documentation

## 2012-11-28 DIAGNOSIS — M79642 Pain in left hand: Secondary | ICD-10-CM

## 2012-11-28 DIAGNOSIS — S6990XA Unspecified injury of unspecified wrist, hand and finger(s), initial encounter: Secondary | ICD-10-CM | POA: Insufficient documentation

## 2012-11-28 DIAGNOSIS — J45909 Unspecified asthma, uncomplicated: Secondary | ICD-10-CM | POA: Insufficient documentation

## 2012-11-28 DIAGNOSIS — Z87891 Personal history of nicotine dependence: Secondary | ICD-10-CM | POA: Insufficient documentation
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
## 2012-12-01 NOTE — Progress Notes (Signed)
Quick Note:  Please let pt/caregivers know blood ct is near normal, mild anemia. TSH is abnormal. He will need FU w/ FANTA,TESFAYE, MD for this. Please call Dr Letitia Neri office to let his nurse know that we will fax labs for him to address abnormal TSH. Thanks ZO:XWRUE,[id number], MD  ______

## 2012-12-01 NOTE — Progress Notes (Signed)
Quick Note:  David Blankenship is aware. She stated she is going to call Dr. Letitia Neri office now and see if she can get him an ov asap. cc'd Dr. Felecia Shelling. ______

## 2013-01-01 ENCOUNTER — Encounter: Payer: Self-pay | Admitting: Gastroenterology

## 2013-01-01 NOTE — Progress Notes (Signed)
Per RMR's note on [place] of abd, have patient follow up with Verlon Au around mid-April.

## 2013-01-08 ENCOUNTER — Other Ambulatory Visit: Payer: Self-pay

## 2013-01-08 MED ORDER — PANTOPRAZOLE SODIUM 40 MG PO TBEC: 40.0000 mg | DELAYED_RELEASE_TABLET | Freq: Every day | ORAL | Status: DC

## 2013-01-08 NOTE — Progress Notes (Signed)
Pt's caregiver is aware of OV on 4/16 at 1030

## 2013-02-02 ENCOUNTER — Encounter: Payer: Self-pay | Admitting: Internal Medicine

## 2013-02-04 ENCOUNTER — Encounter: Payer: Self-pay | Admitting: Gastroenterology

## 2013-02-04 ENCOUNTER — Ambulatory Visit (INDEPENDENT_AMBULATORY_CARE_PROVIDER_SITE_OTHER): Payer: Medicaid Other | Admitting: Gastroenterology

## 2013-02-04 VITALS — BP 118/67 | HR 84 | Temp 98.0°F | Ht 73.0 in | Wt 148.0 lb

## 2013-02-04 DIAGNOSIS — D649 Anemia, unspecified: Secondary | ICD-10-CM

## 2013-02-04 DIAGNOSIS — D696 Thrombocytopenia, unspecified: Secondary | ICD-10-CM | POA: Insufficient documentation

## 2013-02-04 DIAGNOSIS — K2100 Gastro-esophageal reflux disease with esophagitis, without bleeding: Secondary | ICD-10-CM | POA: Insufficient documentation

## 2013-02-04 NOTE — Patient Instructions (Signed)
Please have staff call us with list of medication/dose. We will let you know if further lab work will be needed after we contact Dr. Letitia Neri office.
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
## 2013-02-04 NOTE — Progress Notes (Signed)
Cc PCP 

## 2013-02-04 NOTE — Assessment & Plan Note (Signed)
Doing better on PPI. Symptoms resolved, weight up 4 pounds. Interestingly however when he finally had his blood work done in February he had mild anemia and thrombocytopenia. No evidence of chronic liver disease on abdominal ultrasound. He also had elevated TSH and it is unclear whether or not he ever followed up with Dr. Felecia Shelling regarding this. He will continue protonic. He can discontinue Zantac. We will obtain a copy of his medication list to verify that he is taking the correct medications. We will check with Dr. Letitia Neri office to see if they address the elevated TSH, obtain copy of their most recent labs. Based on these findings, he may need to have repeat CBC and TSH in the near future.

## 2013-02-05 ENCOUNTER — Telehealth: Payer: Self-pay

## 2013-02-05 NOTE — Telephone Encounter (Signed)
Called and spoke to IDA at Dr. Letitia Neri. Pt was last seen 12/02/2012. No recent labs. She will make sure that the TSH has been addressed, and I am faxing the copy over again to her.

## 2013-02-11 NOTE — Telephone Encounter (Signed)
Patient needs to have CBC rechecked. Have the labs draw red top/serum for possible add on of anemia panel.

## 2013-02-16 ENCOUNTER — Other Ambulatory Visit: Payer: Self-pay

## 2013-02-16 DIAGNOSIS — D649 Anemia, unspecified: Secondary | ICD-10-CM

## 2013-02-17 NOTE — Telephone Encounter (Signed)
David Blankenship is aware and the order with additional instructions was faxed to [place], and I confirmed they received it.

## 2013-04-20 ENCOUNTER — Encounter: Payer: Self-pay | Admitting: Gastroenterology

## 2013-04-20 ENCOUNTER — Ambulatory Visit (INDEPENDENT_AMBULATORY_CARE_PROVIDER_SITE_OTHER): Payer: Medicaid Other | Admitting: Gastroenterology

## 2013-04-20 VITALS — BP 117/71 | HR 71 | Temp 97.8°F | Ht 73.0 in | Wt 155.6 lb

## 2013-04-20 DIAGNOSIS — K5900 Constipation, unspecified: Secondary | ICD-10-CM | POA: Insufficient documentation

## 2013-04-20 DIAGNOSIS — K625 Hemorrhage of anus and rectum: Secondary | ICD-10-CM

## 2013-04-20 MED ORDER — HYDROCORTISONE ACETATE 25 MG RE SUPP: 25.0000 mg | Freq: Two times a day (BID) | RECTAL | Status: DC

## 2013-04-20 MED ORDER — DOCUSATE SODIUM 100 MG PO CAPS: 200.0000 mg | ORAL_CAPSULE | Freq: Every day | ORAL | Status: DC

## 2013-04-20 NOTE — Progress Notes (Signed)
Primary Care Physician: Avon Gully, MD  Primary Gastroenterologist:  Roetta Sessions, MD   Chief Complaint  Patient presents with  . Follow-up    abd pain    HPI: David Blankenship is a 27 y.o. male here for followup of rectal bleeding. Dr. Felecia Shelling requested we see him after visit on 03/26/2013 with him for blood in the stool. We last saw the patient April 2014 for followup of weight loss, anorexia, vomiting, rectal pain/rectal bleeding. Last EGD and colonoscopy was in January 2014 by Dr. Jena Gauss. 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 No H. pylori seen. Colonoscopy showed hemorrhoids and anal papilloma. He has a history of mild anemia with hemoglobin 11.9, platelet count was 140,000. Abdominal ultrasound showed normal spleen and liver. He had a slightly elevated TSH and we spoke with Dr. Letitia Neri nurse regarding this and patient was to followup with him.  Weight is up 7 pounds since his last office visit. Patient had recent CBC with Dr. Felecia Shelling, we have requested records. Patient reports he recently had some constipation. He had to strain quite a bit and then had 5-6 episodes of bright red blood per rectum. He describes as small volume. He reports that he will have loose stools with certain foods which are high in fiber. He has constipation with dairy products. Really denies abdominal pain, vomiting. Rarely has heartburn on pantoprazole. His appetite is good. He reports his Depakote level was too high recently and has to go have it rechecked. Otherwise he feels well.  Current Outpatient Prescriptions  Medication Sig Dispense Refill  . benztropine (COGENTIN) 1 MG tablet Take 0.5 mg by mouth 2 (two) times daily.      . divalproex (DEPAKOTE) 500 MG DR tablet Take 1,000 mg by mouth 3 (three) times daily.       . haloperidol (HALDOL) 5 MG tablet Take 5-10 mg by mouth 2 (two) times  daily. Take 1 tablet by mouth in the morning and take 4 tablets at bedtime      . pantoprazole (PROTONIX) 40 MG tablet Take 1 tablet (40 mg total) by mouth daily.  31 tablet  11  . ranitidine (ZANTAC) 150 MG capsule Take 150 mg by mouth 2 (two) times daily.      . Tamsulosin HCl (FLOMAX) 0.4 MG CAPS Take 1 capsule (0.4 mg total) by mouth 2 (two) times daily in the am and at bedtime.. For urinary hesitancy.      . traZODone (DESYREL) 100 MG tablet Take 200 mg by mouth at bedtime.       Marland Kitchen buPROPion (WELLBUTRIN XL) 150 MG 24 hr tablet Take 150 mg by mouth every morning. For depression.       No current facility-administered medications for this visit.    Allergies as of 04/20/2013 - Review Complete 04/20/2013  Allergen Reaction Noted  . Acetaminophen Hives 12/25/2011  . Shellfish allergy Hives 12/25/2011    ROS:  General: Negative for anorexia, weight loss, fever, chills, fatigue, weakness. ENT: Negative for hoarseness, difficulty swallowing , nasal congestion. CV: Negative for chest pain, angina, palpitations, dyspnea on exertion, peripheral edema.  Respiratory: Negative for dyspnea at rest, dyspnea on exertion, cough, sputum, wheezing.  GI: See history of present illness. GU:  Negative for dysuria, hematuria, urinary incontinence, urinary frequency, nocturnal urination.  Endo: Negative for unusual weight change.    Physical Examination:   BP 117/71  Pulse 71  Temp(Src)  97.8 F (36.6 C) (Oral)  Ht 6\' 1"  (1.854 m)  Wt 155 lb 9.6 oz (70.58 kg)  BMI 20.53 kg/m2  General: Well-nourished, well-developed in no acute distress.  Eyes: No icterus. Mouth: Oropharyngeal mucosa moist and pink , no lesions erythema or exudate. Lungs: Clear to auscultation bilaterally.  Heart: Regular rate and rhythm, no murmurs rubs or gallops.  Abdomen: Bowel sounds are normal, nontender, nondistended, no hepatosplenomegaly or masses, no abdominal bruits or hernia , no rebound or guarding.   Extremities:  No lower extremity edema. No clubbing or deformities. Neuro: Alert and oriented x 4   Skin: Warm and dry, no jaundice.   Psych: Alert and cooperative, normal mood and affect.

## 2013-04-20 NOTE — Assessment & Plan Note (Signed)
Recent bright red blood per rectum in the setting of constipation. He has known hemorrhoids on recent colonoscopy. Suspect benign anorectal bleeding. We'll request copies of recent CBC done by Dr. Felecia Shelling. Previously had mild anemia, patient never had followup blood work is recommended. He is a resident of a group home. We'll treat constipation. He will take Colace 200 mg at bedtime, hold for diarrhea. Anusol HC suppositories twice a day for 10 days. Explained to patient regarding this medication and hopefully he will tolerate it. He is to call if any further problems.  Prior weight loss has resolved. Anorexia resolved, appetite now good.

## 2013-04-20 NOTE — Patient Instructions (Addendum)
1. I have requested a copy of your most recent bloodwork. If any further testing needs to be done we will let you know. 2. Anusol suppositories twice daily for 10 days. This is for your hemorrhoids. 3. You need to keep your stool soft. Please take Colace 200mg  at bedtime. Hold for diarrhea.  4. Call with any further problems.

## 2013-04-20 NOTE — Progress Notes (Signed)
Cc PCP 

## 2013-04-29 ENCOUNTER — Other Ambulatory Visit: Payer: Self-pay | Admitting: Gastroenterology

## 2013-04-29 DIAGNOSIS — D649 Anemia, unspecified: Secondary | ICD-10-CM

## 2013-04-29 NOTE — Progress Notes (Signed)
David Blankenship is aware. Lab order on file.

## 2013-04-29 NOTE — Progress Notes (Signed)
Labs from 03/27/2013  Glucose 72, BUN 8, creatinine 1.07, sodium 136, potassium 4.5, albumin 4, total bilirubin 0.4, alkaline phosphatase 38, AST 12, ALT 6, white blood cell count 4300, hemoglobin 12.4, hematocrit 35.5, MCV 95.4, platelets 226,000.  Question mild anemia related to medication.  Repeat CBC, iron and TIBC, ferritin, vitamin B12, folate in 8 weeks.

## 2013-05-06 NOTE — Progress Notes (Signed)
REVIEWED.  

## 2013-06-15 ENCOUNTER — Other Ambulatory Visit: Payer: Self-pay

## 2013-06-15 DIAGNOSIS — D649 Anemia, unspecified: Secondary | ICD-10-CM

## 2013-07-15 ENCOUNTER — Other Ambulatory Visit: Payer: Self-pay | Admitting: Gastroenterology

## 2013-07-22 ENCOUNTER — Other Ambulatory Visit: Payer: Self-pay | Admitting: Gastroenterology

## 2013-07-22 LAB — CBC WITH DIFFERENTIAL/PLATELET
Basophils Absolute: 0 10*3/uL (ref 0.0–0.1)
Basophils Relative: 0 % (ref 0–1)
Eosinophils Absolute: 0.2 10*3/uL (ref 0.0–0.7)
Eosinophils Relative: 5 % (ref 0–5)
HCT: 37 % — ABNORMAL LOW (ref 39.0–52.0)
Lymphocytes Relative: 46 % (ref 12–46)
MCH: 30.5 pg (ref 26.0–34.0)
MCHC: 34.6 g/dL (ref 30.0–36.0)
MCV: 88.1 fL (ref 78.0–100.0)
Monocytes Absolute: 0.5 10*3/uL (ref 0.1–1.0)
Platelets: 153 10*3/uL (ref 150–400)
RDW: 15.3 % (ref 11.5–15.5)

## 2013-07-22 LAB — IRON: Iron: 138 ug/dL (ref 42–165)

## 2013-07-23 ENCOUNTER — Other Ambulatory Visit: Payer: Self-pay | Admitting: Gastroenterology

## 2013-07-23 ENCOUNTER — Telehealth: Payer: Self-pay | Admitting: Gastroenterology

## 2013-07-23 DIAGNOSIS — D649 Anemia, unspecified: Secondary | ICD-10-CM

## 2013-07-23 NOTE — Progress Notes (Signed)
Quick Note:  H/H better. No evidence of iron, folate, or B12 def. Recheck CBC in 2 months. ______

## 2013-07-23 NOTE — Telephone Encounter (Signed)
Mrs David Blankenship said she just talked with someone here, but doesn't know who because she couldn't hear them that well on the phone. It's regarding patient and asked for a return call. [phone] (I didn't see a phone note and couldn't locate who called)

## 2013-07-23 NOTE — Telephone Encounter (Signed)
I did not talk with her.

## 2013-07-24 NOTE — Telephone Encounter (Signed)
Called LMOM

## 2013-07-24 NOTE — Telephone Encounter (Signed)
RMR PT 

## 2013-07-24 NOTE — Telephone Encounter (Signed)
Raynelle Fanning called about the labs.

## 2013-08-11 ENCOUNTER — Ambulatory Visit: Payer: Self-pay | Admitting: Gastroenterology

## 2013-08-13 ENCOUNTER — Encounter: Payer: Self-pay | Admitting: Gastroenterology

## 2013-08-13 ENCOUNTER — Other Ambulatory Visit: Payer: Self-pay | Admitting: Gastroenterology

## 2013-08-13 ENCOUNTER — Ambulatory Visit (INDEPENDENT_AMBULATORY_CARE_PROVIDER_SITE_OTHER): Payer: Medicaid Other | Admitting: Gastroenterology

## 2013-08-13 VITALS — BP 120/66 | HR 84 | Temp 97.0°F | Wt 154.4 lb

## 2013-08-13 DIAGNOSIS — R112 Nausea with vomiting, unspecified: Secondary | ICD-10-CM

## 2013-08-13 DIAGNOSIS — R1013 Epigastric pain: Secondary | ICD-10-CM

## 2013-08-13 DIAGNOSIS — R109 Unspecified abdominal pain: Secondary | ICD-10-CM

## 2013-08-13 NOTE — Patient Instructions (Signed)
We have scheduled you for a HIDA scan that will further check your gallbladder.   Please follow the low-fat diet. Don't eat bacon or pork chops. Stay away from those foods that cause pain.   I will see you in 2 weeks!

## 2013-08-13 NOTE — Progress Notes (Signed)
Referring Provider: Avon Gully, MD Primary Care Physician:  Avon Gully, MD Primary GI: Dr. Jena Gauss   Chief Complaint  Patient presents with  . Abdominal Pain  . Emesis    HPI:   David Blankenship presents today due to abdominal pain and vomiting. Last seen in June 2014 by our practice. Hx of mild anemia, with most recent Hgb early October much improved and no evidence of iron, folate, or B12 deficiency. Originally seen by our practice due to weight loss, anorexia, nausea, vomiting. Last EGD and colonoscopy was in January 2014 by Dr. Jena Gauss. 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 No H. pylori seen. Colonoscopy showed hemorrhoids and anal papilloma.  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 Stomach cramping. Chocolate will give diarrhea. Stomach hurts when constipated. Points supra-umbilical as location of pain. States constant pain. Pork chops, burger, starts cutting up. Intermittent nausea. Ate eggs three days ago and started "cutting up again, had to throw up again". BM about twice a day. A lot of greens will give diarrhea. Feels like symptoms are worsening day by day. Burping a lot. Sometimes headaches. Sodas worsen reflux. [name] worsens.     Past Medical History  Diagnosis Date  . Asthma   . Seizures   . Bipolar 1 disorder   . Schizophrenia   . ADHD (attention deficit hyperactivity disorder)     Past Surgical History  Procedure Laterality Date  . None    . No past surgeries    . Colonoscopy with esophagogastroduodenoscopy (egd)  10/28/2012    NFA:[id number] tiny distal esophageal erosions consistent with mild erosive reflux esophagitis. Hiatal hernia. 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 benztropine (COGENTIN) 1 MG tablet Take 0.5 mg by mouth 2 (two) times daily.      Marland Kitchen buPROPion (WELLBUTRIN XL) 150 MG 24 hr tablet Take 150 mg by mouth every morning. For depression.      . divalproex (DEPAKOTE) 500 MG DR tablet Take 1,000 mg by mouth 3 (three) times daily.       . haloperidol (HALDOL) 5 MG tablet Take 5-10 mg by mouth 2 (two) times daily. Take 1 tablet by mouth in the morning and take 4 tablets at bedtime      . pantoprazole (PROTONIX) 40 MG tablet Take 1 tablet (40 mg total) by mouth daily.  31 tablet  11  . ranitidine (ZANTAC) 150 MG capsule Take 150 mg by mouth 2 (two) times daily.      . STOOL SOFTENER 100 MG capsule TAKE 2 CAPSULES BY MOUTH AT BEDTIME. HOLD FOR DIARRHEA.  60 capsule  11  . Tamsulosin HCl (FLOMAX) 0.4 MG CAPS Take 1 capsule (0.4 mg total) by mouth 2 (two) times daily in the am and at bedtime.. For urinary hesitancy.      . traZODone (DESYREL) 100 MG tablet Take 200 mg by mouth at bedtime.        No current facility-administered medications for this visit.    Allergies as of 08/13/2013 - Review Complete 08/13/2013  Allergen Reaction Noted  . Acetaminophen Hives 12/25/2011  . Shellfish allergy Hives 12/25/2011    Family History  Problem Relation Age of Onset  . Seizures Father   . Asthma Other   .  Seizures Other   . Cancer Other     aunt    History   Social History  . Marital Status: Single    Spouse Name: N/A    Number of Children: N/A  . Years of Education: N/A   Social History Main Topics  . Smoking status: Former Smoker -- 0.50 packs/day for 12 years    Types: Cigarettes  . Smokeless tobacco: None     Comment: 5 or 6 cigarettes daily  . Alcohol Use: No     Comment: Quit drinking alcoholic beverages. No etoh since 01/2012  . Drug Use: Yes    Special: Marijuana     Comment: none since 01/2012  . Sexual Activity: Yes    Birth Control/ Protection: Condom    Other Topics Concern  . None   Social History Narrative  . None    Review of Systems: As mentioned in HPI  Physical Exam: BP 120/66  Pulse 84  Temp(Src) 97 F (36.1 C) (Oral)  Wt 154 lb 6.4 oz (70.035 kg)  BMI 20.37 kg/m2 General:   Alert and oriented. No distress noted. Pleasant and cooperative.  Head:  Normocephalic and atraumatic. Eyes:  Conjuctiva clear without scleral icterus. Mouth:  Oral mucosa pink and moist. Good dentition. No lesions. Heart:  S1, S2 present without murmurs, rubs, or gallops. Regular rate and rhythm. Abdomen:  +BS, soft, TTP upper abdomen and non-distended. No rebound or guarding. No HSM or masses noted. Msk:  Symmetrical without gross deformities. Normal posture. Extremities:  Without edema. Neurologic:  Alert and  oriented x4;  grossly normal neurologically. Skin:  Intact without significant lesions or rashes. Psych:  Alert and cooperative. Normal mood and affect.

## 2013-08-13 NOTE — Assessment & Plan Note (Signed)
Intermittent, associated with N/V, appears related to food choices. Question underlying biliary dyskinesia. [place] of abdomen on file from Jan 2014, EGD Jan 2014 on file. Proceed with HIDA scan, follow low-fat diet, likely GES if HIDA negative. Question underlying chronic abdominal pain. With patient's psychiatric history, it is difficult to obtain a thorough subjective history, but his symptoms do appear to line up with a biliary component. His weight is stable. Return in 2 weeks with me.

## 2013-08-17 NOTE — Progress Notes (Signed)
cc'd to pcp 

## 2013-08-19 ENCOUNTER — Encounter (HOSPITAL_COMMUNITY)
Admission: RE | Admit: 2013-08-19 | Discharge: 2013-08-19 | Disposition: A | Discharge status: Home / Self Care | Payer: Medicaid Other | Source: Ambulatory Visit | Attending: Gastroenterology | Admitting: Gastroenterology

## 2013-08-19 ENCOUNTER — Encounter (HOSPITAL_COMMUNITY): Payer: Self-pay

## 2013-08-19 DIAGNOSIS — R109 Unspecified abdominal pain: Secondary | ICD-10-CM | POA: Insufficient documentation

## 2013-08-19 DIAGNOSIS — R1013 Epigastric pain: Secondary | ICD-10-CM

## 2013-08-19 DIAGNOSIS — R112 Nausea with vomiting, unspecified: Secondary | ICD-10-CM | POA: Insufficient documentation

## 2013-08-19 MED ORDER — TECHNETIUM TC 99M MEBROFENIN IV KIT
5.0000 | PACK | Freq: Once | INTRAVENOUS | Status: AC | PRN
  Administered 2013-08-19: 5 via INTRAVENOUS

## 2013-08-19 MED ORDER — STERILE WATER FOR INJECTION IJ SOLN
INTRAMUSCULAR | Status: AC
  Administered 2013-08-19: 09:00:00 via INTRAVENOUS
  Filled 2013-08-19: qty 10

## 2013-08-19 MED ORDER — SINCALIDE 5 MCG IJ SOLR
INTRAMUSCULAR | Status: AC
  Administered 2013-08-19: 1.41 ug via INTRAVENOUS
  Filled 2013-08-19: qty 5

## 2013-08-27 ENCOUNTER — Ambulatory Visit (INDEPENDENT_AMBULATORY_CARE_PROVIDER_SITE_OTHER): Payer: Medicaid Other | Admitting: Gastroenterology

## 2013-08-27 ENCOUNTER — Encounter: Payer: Self-pay | Admitting: Gastroenterology

## 2013-08-27 VITALS — BP 117/67 | HR 75 | Temp 97.0°F | Wt 155.2 lb

## 2013-08-27 DIAGNOSIS — R109 Unspecified abdominal pain: Secondary | ICD-10-CM

## 2013-08-27 NOTE — Progress Notes (Signed)
Referring Provider: Avon Gully, MD Primary Care Physician:  Avon Gully, MD Primary GI: Dr. Jena Gauss   Chief Complaint  Patient presents with  . Follow-up    HPI:   Follow-up for nausea and vomiting. HIDA scan in interim. Last EGD and colonoscopy was in January 2014 by Dr. Jena Gauss. 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 No H. pylori seen. Colonoscopy showed hemorrhoids and anal papilloma. Seen late October with nausea and vomiting, which appeared to be food-related. [place] of abdomen already on file, and HIDA scan was ordered. This showed an EF of 95%; patient denies symptoms during CCK infusion. N/V improved. Eating "good at the house". Chocolate upsets his stomach. No abdominal pain. Ate fried foods without difficulty recently. Said he and God had a talk. Things are better. God told him "it's over now" (regarding n/v).    Past Medical History  Diagnosis Date  . Asthma   . Seizures   . Bipolar 1 disorder   . Schizophrenia   . ADHD (attention deficit hyperactivity disorder)     Past Surgical History  Procedure Laterality Date  . None    . No past surgeries    . Colonoscopy with esophagogastroduodenoscopy (egd)  10/28/2012    AOZ:HYQMVH tiny distal esophageal erosions consistent with mild erosive reflux esophagitis. Hiatal hernia. 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 benztropine (COGENTIN) 1 MG tablet Take 0.5 mg by mouth 2 (two) times daily.      Marland Kitchen buPROPion (WELLBUTRIN XL) 150 MG 24 hr tablet Take 150 mg by mouth every morning. For depression.      . clotrimazole-betamethasone (LOTRISONE) cream Apply 1 application topically 2 (two) times daily.       . divalproex (DEPAKOTE) 500 MG DR tablet Take 1,000 mg by mouth 3 (three) times daily.       . haloperidol (HALDOL) 5 MG tablet Take 5-10 mg by mouth 2 (two) times daily. Take 1 tablet by mouth in the morning and take 4 tablets at bedtime      . pantoprazole (PROTONIX) 40 MG tablet Take 1 tablet (40 mg total) by mouth daily.  31 tablet  11  . STOOL SOFTENER 100 MG capsule TAKE 2 CAPSULES BY MOUTH AT BEDTIME. HOLD FOR DIARRHEA.  60 capsule  11  . Tamsulosin HCl (FLOMAX) 0.4 MG CAPS Take 1 capsule (0.4 mg total) by mouth 2 (two) times daily in the am and at bedtime.. For urinary hesitancy.      . traZODone (DESYREL) 100 MG tablet Take 200 mg by mouth at bedtime.       . ranitidine (ZANTAC) 150 MG capsule Take 150 mg by mouth 2 (two) times daily.       No current facility-administered medications for this visit.    Allergies as of 08/27/2013 - Review Complete 08/27/2013  Allergen Reaction Noted  . Acetaminophen Hives 12/25/2011  . Bee pollen  08/19/2013  . Shellfish allergy Hives 12/25/2011    Family History  Problem Relation Age of Onset  . Seizures Father   . Asthma Other   . Seizures Other   . Cancer Other     aunt    History   Social History  . Marital Status: Single    Spouse Name: N/A  Number of Children: N/A  . Years of Education: N/A   Social History Main Topics  . Smoking status: Former Smoker -- 0.50 packs/day for 12 years    Types: Cigarettes  . Smokeless tobacco: None     Comment: 5 or 6 cigarettes daily  . Alcohol Use: No     Comment: Quit drinking alcoholic beverages. No etoh since 01/2012  . Drug Use: Yes    Special: Marijuana     Comment: none since 01/2012  . Sexual Activity: Yes    Birth Control/ Protection: Condom   Other Topics Concern  . None   Social History Narrative  . None    Review of Systems: As mentioned in HPI.   Physical Exam: BP 117/67  Pulse 75  Temp(Src) 97 F (36.1 C) (Oral)  Wt 155 lb 3.2 oz (70.398 kg) General:    Alert and oriented. No distress noted. Pleasant and cooperative.  Head:  Normocephalic and atraumatic. Eyes:  Conjuctiva clear without scleral icterus. Abdomen:  +BS, soft, non-tender and non-distended. No rebound or guarding. No HSM or masses noted. Msk:  Symmetrical without gross deformities. Normal posture. Extremities:  Without edema. Neurologic:  Alert and  oriented x4;  grossly normal neurologically. Skin:  Intact without significant lesions or rashes. Psych:  Alert and cooperative. Normal mood and affect.'

## 2013-08-27 NOTE — Assessment & Plan Note (Signed)
Resolved. N/V resolved. HIDA scan normal. Likely symptoms were related to dietary behaviors. Continue Protonix daily, avoid trigger foods, and return in April 2015.

## 2013-08-27 NOTE — Patient Instructions (Signed)
Keep taking Protonix every day.   Avoid fatty foods, fried foods, and limit chocolate.  We will see you in April!

## 2013-08-31 NOTE — Progress Notes (Signed)
cc'd to pcp 

## 2013-09-14 ENCOUNTER — Other Ambulatory Visit: Payer: Self-pay

## 2013-09-14 DIAGNOSIS — D649 Anemia, unspecified: Secondary | ICD-10-CM

## 2013-10-03 LAB — CBC WITH DIFFERENTIAL/PLATELET
Basophils Absolute: 0 10*3/uL (ref 0.0–0.1)
Basophils Relative: 0 % (ref 0–1)
Eosinophils Relative: 3 % (ref 0–5)
Hemoglobin: 12.8 g/dL — ABNORMAL LOW (ref 13.0–17.0)
MCH: 31.7 pg (ref 26.0–34.0)
Monocytes Absolute: 0.5 10*3/uL (ref 0.1–1.0)
Monocytes Relative: 9 % (ref 3–12)
Neutro Abs: 2.3 10*3/uL (ref 1.7–7.7)
Platelets: 194 10*3/uL (ref 150–400)
RBC: 4.04 MIL/uL — ABNORMAL LOW (ref 4.22–5.81)
RDW: 15.8 % — ABNORMAL HIGH (ref 11.5–15.5)
WBC: 5.3 10*3/uL (ref 4.0–10.5)

## 2013-10-14 ENCOUNTER — Other Ambulatory Visit: Payer: Self-pay

## 2013-10-14 DIAGNOSIS — D649 Anemia, unspecified: Secondary | ICD-10-CM

## 2013-10-23 NOTE — Progress Notes (Signed)
REVIEWED.  

## 2013-11-20 ENCOUNTER — Ambulatory Visit (INDEPENDENT_AMBULATORY_CARE_PROVIDER_SITE_OTHER): Payer: Medicaid Other | Admitting: Internal Medicine

## 2013-11-20 ENCOUNTER — Encounter: Payer: Self-pay | Admitting: Internal Medicine

## 2013-11-20 VITALS — BP 125/64 | HR 69 | Temp 98.4°F | Wt 145.6 lb

## 2013-11-20 DIAGNOSIS — K219 Gastro-esophageal reflux disease without esophagitis: Secondary | ICD-10-CM

## 2013-11-20 NOTE — Progress Notes (Signed)
Primary Care Physician:  Avon GullyFANTA,TESFAYE, MD Primary Gastroenterologist:  Dr. Jena Gaussourk  Pre-Procedure History & Physical: HPI:  David Blankenship is a 28 y.o. male here for followup nausea vomiting-history of erosive reflux esophagitis. Nausea and vomiting resolved on Protonix daily.  As long as he watches what he eats he does very well. He loves chocolates and sometimes over indulges but usually gets by most of the time. He's lost about 9 pounds since his last year, however, I question accuracy of prior weight. Caregiver states he's doing well -  not complaining of any abdominal pain or bowel changes. No dysphagia.  Past Medical History  Diagnosis Date  . Asthma   . Seizures   . Bipolar 1 disorder   . Schizophrenia   . ADHD (attention deficit hyperactivity disorder)     Past Surgical History  Procedure Laterality Date  . None    . No past surgeries    . Colonoscopy with esophagogastroduodenoscopy (egd)  10/28/2012    YNW:[id number]:Single tiny distal esophageal erosions consistent with mild erosive reflux esophagitis. Hiatal hernia. 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 Authorizing Provider  benztropine (COGENTIN) 1 MG tablet Take 0.5 mg by mouth 2 (two) times daily.   Yes Historical Provider, MD  buPROPion (WELLBUTRIN XL) 150 MG 24 hr tablet Take 150 mg by mouth every morning. For depression.   Yes Viviann SpareMargaret A Scott, FNP  clotrimazole-betamethasone (LOTRISONE) cream Apply 1 application topically 2 (two) times daily.   Yes Historical Provider, MD  divalproex (DEPAKOTE) 500 MG DR tablet Take 1,000 mg by mouth 3 (three) times daily.    Yes Historical Provider, MD  haloperidol (HALDOL) 5 MG tablet Take 5-10 mg by mouth 2 (two) times daily. Take 1 tablet by mouth in the morning and  take 4 tablets at bedtime   Yes Historical Provider, MD  pantoprazole (PROTONIX) 40 MG tablet Take 1 tablet (40 mg total) by mouth daily. 01/08/13  Yes Joselyn ArrowKandice L Jones, NP  STOOL SOFTENER 100 MG capsule TAKE 2 CAPSULES BY MOUTH AT BEDTIME. HOLD FOR DIARRHEA. 07/15/13  Yes Tiffany KocherLeslie S Lewis, PA-C  Tamsulosin HCl (FLOMAX) 0.4 MG CAPS Take 1 capsule (0.4 mg total) by mouth 2 (two) times daily in the am and at bedtime.. For urinary hesitancy. 01/02/12  Yes Viviann SpareMargaret A Scott, FNP  traZODone (DESYREL) 100 MG tablet Take 200 mg by mouth at bedtime.    Yes Historical Provider, MD    Allergies as of 11/20/2013 - Review Complete 11/20/2013  Allergen Reaction Noted  . Acetaminophen Hives 12/25/2011  . Bee pollen  08/19/2013  . Shellfish allergy Hives 12/25/2011    Family History  Problem Relation Age of Onset  . Seizures Father   . Asthma Other   . Seizures Other   . Cancer Other     aunt    History   Social History  . Marital Status: Single    Spouse Name: N/A    Number of Children: N/A  . Years of Education: N/A   Occupational History  . Not on file.   Social History Main Topics  . Smoking status: Former Smoker -- 0.50 packs/day for 12 years    Types: Cigarettes  . Smokeless tobacco: Not on file     Comment: 5 or 6 cigarettes daily  . Alcohol Use: No  Comment: Quit drinking alcoholic beverages. No etoh since 01/2012  . Drug Use: Yes    Special: Marijuana     Comment: none since 01/2012  . Sexual Activity: Yes    Birth Control/ Protection: Condom   Other Topics Concern  . Not on file   Social History Narrative  . No narrative on file    Review of Systems: See HPI, otherwise negative ROS  Physical Exam: BP 125/64  Pulse 69  Temp(Src) 98.4 F (36.9 C) (Oral)  Wt 145 lb 9.6 oz (66.044 kg) General:   Alert,  Well-developed, well-nourished, pleasant and cooperative in NAD. Accompanied by caregiver. Skin:  Intact without significant lesions or rashes. Eyes:  Sclera clear,  no icterus.   Conjunctiva pink. Ears:  Normal auditory acuity. Nose:  No deformity, discharge,  or lesions. Mouth:  No deformity or lesions. Neck:  Supple; no masses or thyromegaly. No significant cervical adenopathy. Lungs:  Clear throughout to auscultation.   No wheezes, crackles, or rhonchi. No acute distress. Heart:  Regular rate and rhythm; no murmurs, clicks, rubs,  or gallops. Abdomen: Non-distended, normal bowel sounds.  Soft and nontender without appreciable mass or hepatosplenomegaly.  Pulses:  Normal pulses noted. Extremities:  Without clubbing or edema.  Impression:  28 year old gentleman with GERD manifested predominantly with nausea and vomiting now much better with acid suppression therapy and dietary modification. I discussed the multipronged approach to to GERD.  Recommendations:  Continue Protonix daily  GERD information   OV with [place] in 1 year

## 2013-11-20 NOTE — Patient Instructions (Signed)
Continue Protonix daily  GERD information   OV with us in 1 year

## 2013-12-10 ENCOUNTER — Other Ambulatory Visit: Payer: Self-pay

## 2013-12-10 DIAGNOSIS — D649 Anemia, unspecified: Secondary | ICD-10-CM

## 2013-12-14 IMAGING — US US ABDOMEN COMPLETE
1 series · 13 of 25 positions shown · non-contrast
Comparison: None

CLINICAL DATA: Thrombocytopenia.  Evaluate liver and spleen

COMPLETE ABDOMINAL ULTRASOUND

[Series 1: us abdomen complete · 0.28mm/px · 13 of 97 slices shown]
[im 1/97]
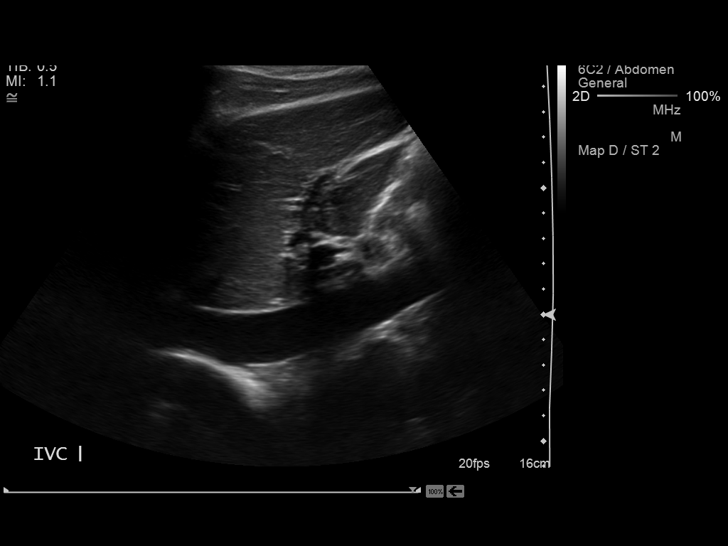
[im 9/97]
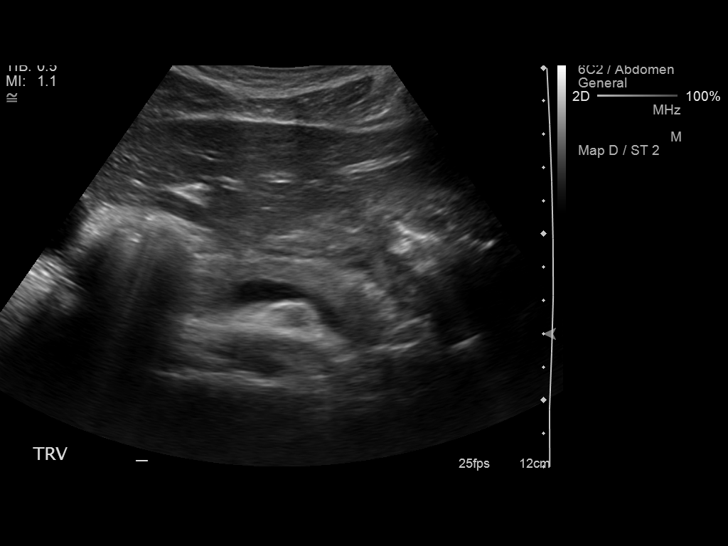
[im 17/97]
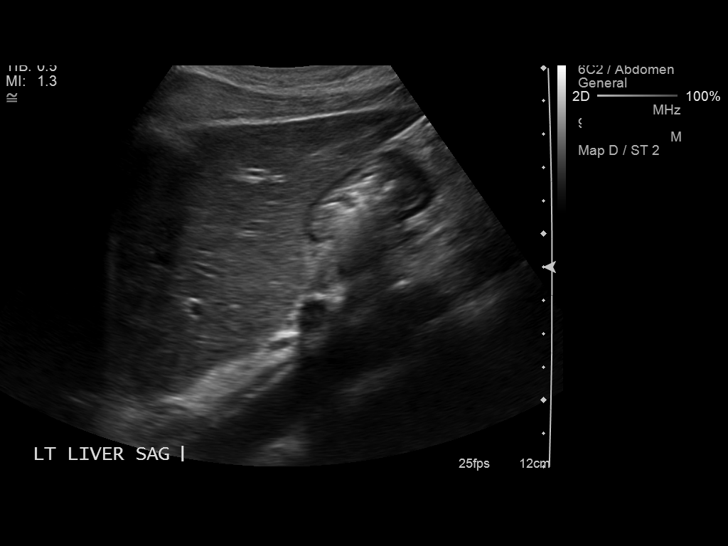
[im 25/97]
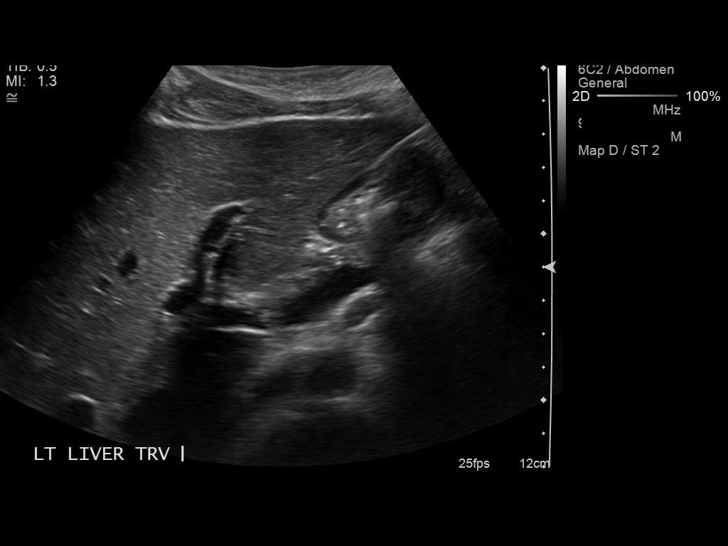
[im 33/97]
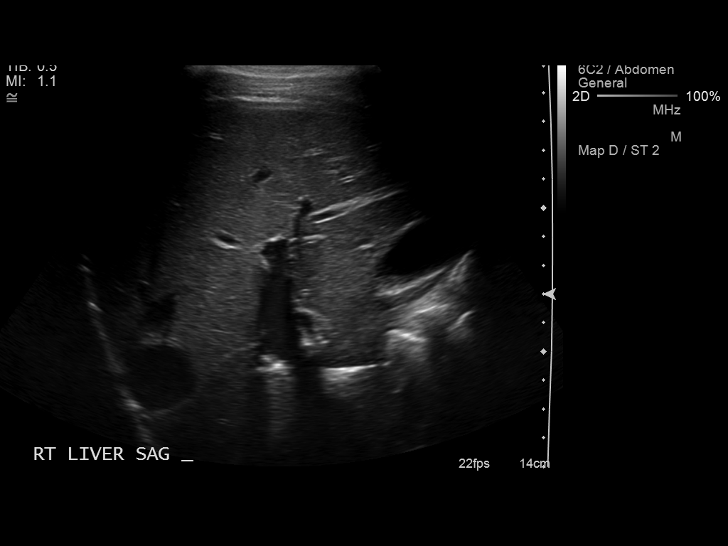
[im 41/97]
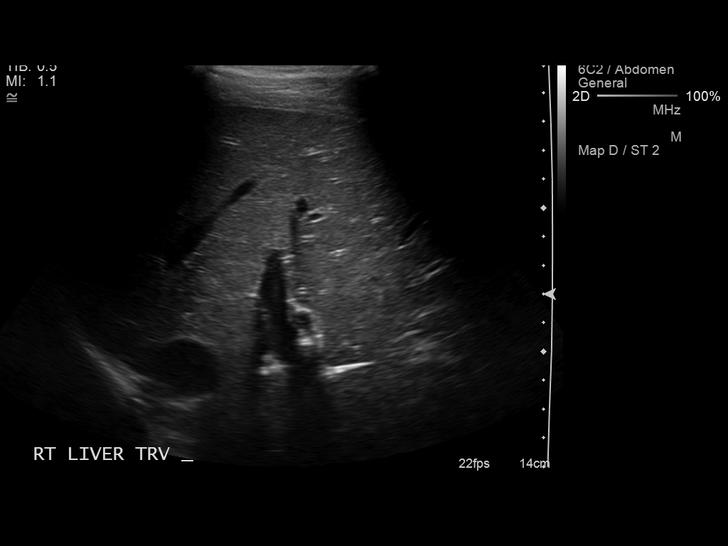
[im 49/97]
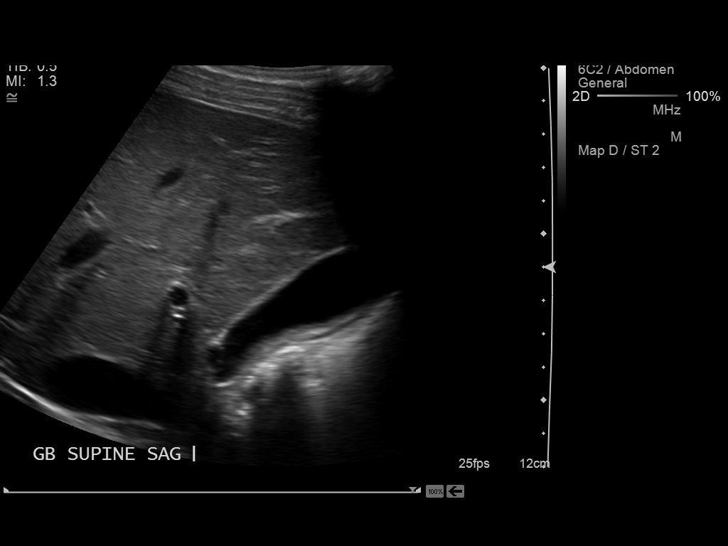
[im 57/97]
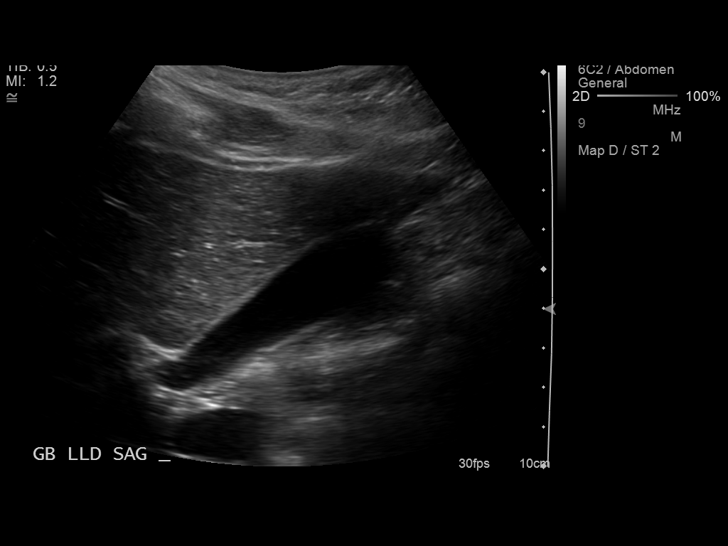
[im 65/97]
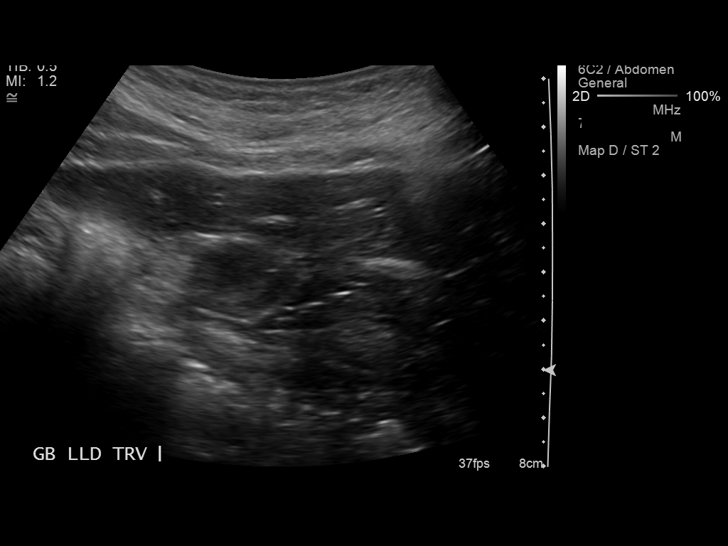
[im 73/97]
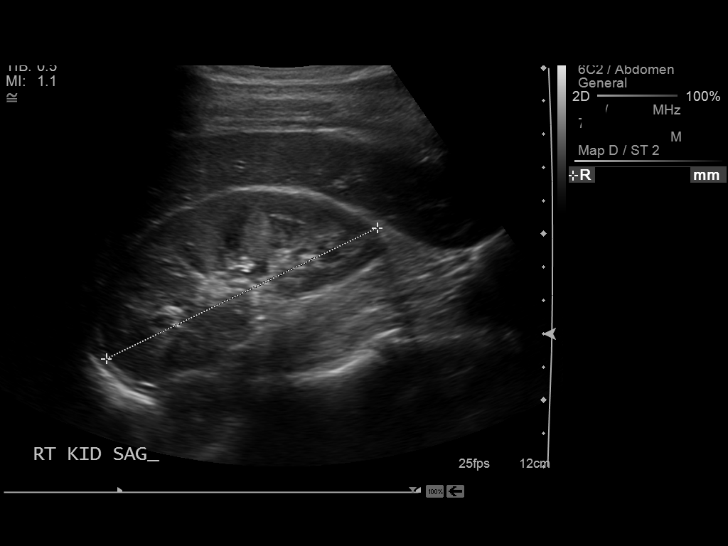
[im 81/97]
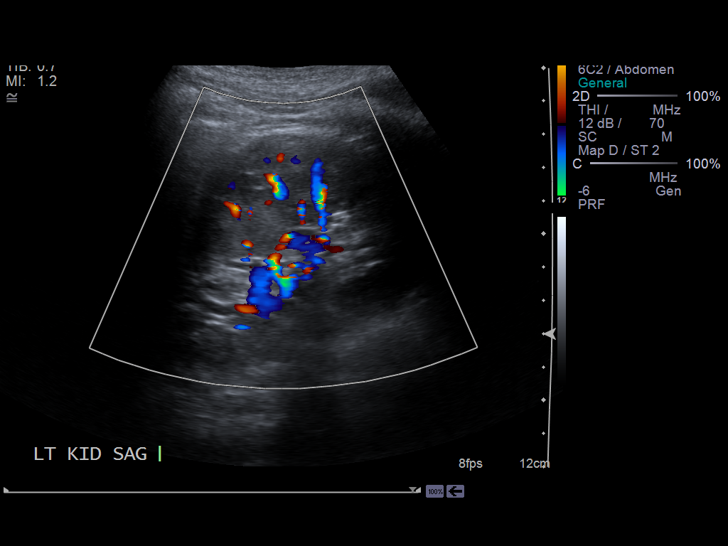
[im 89/97]
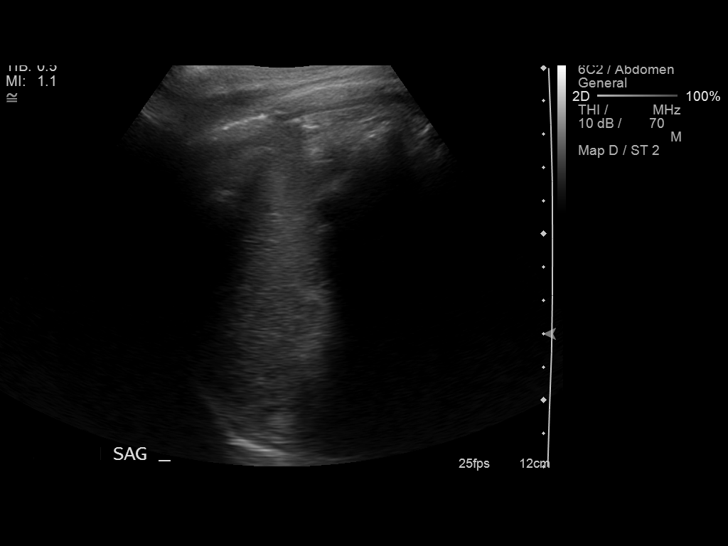
[im 97/97]
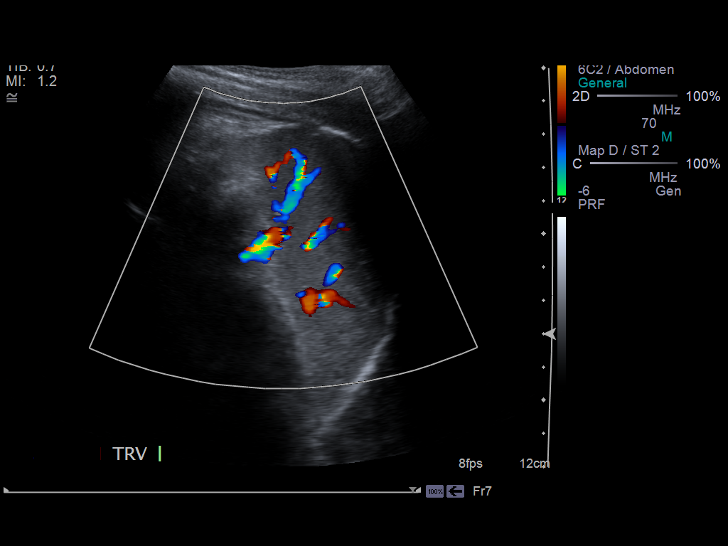

[13 of 25 positions shown; findings below may reference images not displayed]

FINDINGS: Gallbladder:  Appears well distended.  No evidence for intraluminal
stones or sludge are noted.  No pericholecystic fluid or
gallbladder wall thickening is identified.

Common bile duct:  Measures 4.4 mm in diameter and has a normal
appearance

Liver:  Is normal in echotexture with no focal parenchymal
abnormality or signs of intrahepatic ductal dilatation.

IVC:  The proximal portion appears normal

Pancreas:  The pancreatic head and body have a normal size and
echotexture.  The majority of the tail is identified and appears
normal.  The distal most portion of the tail is obscured by
overlying gas

Spleen:  Demonstrates a length of 4.8 cm.  No focal parenchymal
abnormality is identified and echotexture appears normal

Right Kidney:  Has a sagittal length of 9 cm.  No focal parenchymal
abnormality or signs of hydronephrosis are seen

Left Kidney:  Has a sagittal length of 9.5 cm.  No focal
parenchymal abnormalities or signs of hydronephrosis are seen

Abdominal aorta:  Has a caliber 1.7 cm with no evidence for
aneurysmal dilatation.
IMPRESSION: Unremarkable abdominal ultrasound

## 2013-12-15 ENCOUNTER — Other Ambulatory Visit: Payer: Self-pay | Admitting: Urgent Care

## 2013-12-22 ENCOUNTER — Encounter (HOSPITAL_COMMUNITY): Payer: Self-pay | Admitting: Emergency Medicine

## 2013-12-22 ENCOUNTER — Emergency Department (HOSPITAL_COMMUNITY)
Admission: EM | Admit: 2013-12-22 | Discharge: 2013-12-23 | Disposition: A | Discharge status: Home / Self Care | Payer: Medicaid Other | Attending: Emergency Medicine | Admitting: Emergency Medicine

## 2013-12-22 DIAGNOSIS — Z79899 Other long term (current) drug therapy: Secondary | ICD-10-CM | POA: Insufficient documentation

## 2013-12-22 DIAGNOSIS — J45909 Unspecified asthma, uncomplicated: Secondary | ICD-10-CM | POA: Insufficient documentation

## 2013-12-22 DIAGNOSIS — IMO0002 Reserved for concepts with insufficient information to code with codable children: Secondary | ICD-10-CM | POA: Insufficient documentation

## 2013-12-22 DIAGNOSIS — G40909 Epilepsy, unspecified, not intractable, without status epilepticus: Secondary | ICD-10-CM | POA: Insufficient documentation

## 2013-12-22 DIAGNOSIS — F909 Attention-deficit hyperactivity disorder, unspecified type: Secondary | ICD-10-CM | POA: Insufficient documentation

## 2013-12-22 DIAGNOSIS — Z87891 Personal history of nicotine dependence: Secondary | ICD-10-CM | POA: Insufficient documentation

## 2013-12-22 DIAGNOSIS — F319 Bipolar disorder, unspecified: Secondary | ICD-10-CM | POA: Insufficient documentation

## 2013-12-22 NOTE — ED Notes (Signed)
Per EMS: pt was lying on the couch, fell off the couch and had a witnessed seizure lasting roughly 1 minute. EMS reports observing the pt in a post-ictal state for 10-15 min. CBG 82.

## 2013-12-22 NOTE — Discharge Instructions (Signed)
Seizure, Adult A seizure is abnormal electrical activity in the brain. Seizures usually last from 30 seconds to 2 minutes. There are various types of seizures. Before a seizure, you may have a warning sensation (aura) that a seizure is about to occur. An aura may include the following symptoms:   Fear or anxiety.  Nausea.  Feeling like the room is spinning (vertigo).  Vision changes, such as seeing flashing lights or spots. Common symptoms during a seizure include:  A change in attention or behavior (altered mental status).  Convulsions with rhythmic jerking movements.  Drooling.  Rapid eye movements.  Grunting.  Loss of bladder and bowel control.  Bitter taste in the mouth.  Tongue biting. After a seizure, you may feel confused and sleepy. You may also have an injury resulting from convulsions during the seizure. HOME CARE INSTRUCTIONS   If you are given medicines, take them exactly as prescribed by your health care provider.  Keep all follow-up appointments as directed by your health care provider.  Do not swim or drive or engage in risky activity during which a seizure could cause further injury to you or others until your health care provider says it is OK.  Get adequate rest.  Teach friends and family what to do if you have a seizure. They should:  Lay you on the ground to prevent a fall.  Put a cushion under your head.  Loosen any tight clothing around your neck.  Turn you on your side. If vomiting occurs, this helps keep your airway clear.  Stay with you until you recover.  Know whether or not you need emergency care. SEEK IMMEDIATE MEDICAL CARE IF:  The seizure lasts longer than 5 minutes.  The seizure is severe or you do not wake up immediately after the seizure.  You have an altered mental status after the seizure.  You are having more frequent or worsening seizures. Someone should drive you to the emergency department or call local emergency  services (911 in [place] MAKE SURE YOU:  Understand these instructions.  Will watch your condition.  Will get help right away if you are not doing well or get worse. Document Released: 10/05/2000 Document Revised: 07/29/2013 Document Reviewed: 05/20/2013 [employer] Patient Information 2014 [employer].  

## 2013-12-22 NOTE — ED Provider Notes (Signed)
CSN: [id number]     Arrival date & time 12/22/13  2318 History  This chart was scribed for Charles B. Bernette Mayers, MD by Valera Castle, ED Scribe. This patient was seen in room APA12/APA12 and the patient's care was started at 11:45 PM.   Chief Complaint  Patient presents with  . Seizures   (Consider location/radiation/quality/duration/timing/severity/associated sxs/prior Treatment) The history is provided by the patient. No language interpreter was used.  History also provided by group home staff HPI Comments: Level 5 caveat due to schizophrenia David Blankenship is a 28 y.o. male who presents to the Emergency Department complaining of a seizure onset earlier this evening.  She states they were going to give his medication to him after his seizure was through, but states he never fully recovered so they called EMS. EMS reports observing pt in post-ictal state for 10-15 minutes, CBG 82. Pt denies any symptoms, states he feels fine. Per staff he has not yet had his evening dose of Depakote due to the seizure but has otherwise been taking it as directed. PT reports he hasn't had it in a few days but that apparently is not accurate.    PCP Avon Gully, MD  Past Medical History  Diagnosis Date  . Asthma   . Seizures   . Bipolar 1 disorder   . Schizophrenia   . ADHD (attention deficit hyperactivity disorder)    Past Surgical History  Procedure Laterality Date  . None    . No past surgeries    . Colonoscopy with esophagogastroduodenoscopy (egd)  10/28/2012    WUJ:WJXBJY tiny distal esophageal erosions consistent with mild erosive reflux esophagitis. Hiatal hernia. 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 Seizures Father   . Asthma Other   . Seizures Other   . Cancer Other     aunt   History   Substance Use Topics  . Smoking status: Former Smoker -- 0.50 packs/day for 12 years    Types: Cigarettes  . Smokeless tobacco: Not on file     Comment: 5 or 6 cigarettes daily  . Alcohol Use: No     Comment: Quit drinking alcoholic beverages. No etoh since 01/2012    Review of Systems A complete 10 system review of systems was obtained and all systems are negative except as noted in the HPI and PMH.   Allergies  Acetaminophen; Bee pollen; and Shellfish allergy  Home Medications   Current Outpatient Rx  Name  Route  Sig  Dispense  Refill  . benztropine (COGENTIN) 1 MG tablet   Oral   Take 0.5 mg by mouth 2 (two) times daily.         Marland Kitchen buPROPion (WELLBUTRIN XL) 150 MG 24 hr tablet   Oral   Take 150 mg by mouth every morning. For depression.         . clotrimazole-betamethasone (LOTRISONE) cream   Topical   Apply 1 application topically 2 (two) times daily.         . divalproex (DEPAKOTE) 500 MG DR tablet   Oral   Take 1,000 mg by mouth 3 (three) times daily.          . haloperidol (HALDOL) 5 MG tablet   Oral   Take 5-10 mg by mouth 2 (two) times daily. Take 1 tablet by mouth in the morning and take  4 tablets at bedtime         . hydrocortisone cream 1 %   Topical   Apply 1 application topically 2 (two) times daily.         . naproxen (NAPROSYN) 500 MG tablet   Oral   Take 500 mg by mouth as needed.         . pantoprazole (PROTONIX) 40 MG tablet      TAKE 1 TABLET BY MOUTH ONCE DAILY.   30 tablet   11   . STOOL SOFTENER 100 MG capsule      TAKE 2 CAPSULES BY MOUTH AT BEDTIME. HOLD FOR DIARRHEA.   60 capsule   11   . Tamsulosin HCl (FLOMAX) 0.4 MG CAPS   Oral   Take 1 capsule (0.4 mg total) by mouth 2 (two) times daily in the am and at bedtime.. For urinary hesitancy.         . traZODone (DESYREL) 100 MG tablet   Oral   Take 200 mg by mouth at bedtime.           BP 114/63  Pulse 61  Resp 18  SpO2 100%  Physical Exam  Nursing  note and vitals reviewed. Constitutional: He is oriented to person, place, and time. He appears well-developed and well-nourished.  HENT:  Head: Normocephalic and atraumatic.  Eyes: EOM are normal. Pupils are equal, round, and reactive to light.  Neck: Normal range of motion. Neck supple.  Cardiovascular: Normal rate, normal heart sounds and intact distal pulses.   Pulmonary/Chest: Effort normal and breath sounds normal.  Abdominal: Bowel sounds are normal. He exhibits no distension. There is no tenderness.  Musculoskeletal: Normal range of motion. He exhibits no edema and no tenderness.  Neurological: He is alert and oriented to person, place, and time. He has normal strength. No cranial nerve deficit or sensory deficit.  Skin: Skin is warm and dry. No rash noted.  Psychiatric: He has a normal mood and affect. His behavior is normal.    ED Course  Procedures (including critical care time)  DIAGNOSTIC STUDIES: Oxygen Saturation is 100% on room air, normal by my interpretation.    COORDINATION OF CARE: 11:48 PM-Discussed discharge with pt and his mother, and they complied.   Labs Review Labs Reviewed  VALPROIC ACID LEVEL   Imaging Review No results found.   EKG Interpretation None     Medications - No data to display  MDM   Final diagnoses:  Seizure disorder    Pt back to baseline, discussed checking Depakote level, but patient and group home staff are comfortable managing his seizure disorder. Will return to group home. Advised PCP followup for recheck.   I personally performed the services described in this documentation, which was scribed in my presence. The recorded information has been reviewed and is accurate.      Charles B. Bernette MayersSheldon, MD 12/23/13 [phone]

## 2014-01-04 ENCOUNTER — Ambulatory Visit (INDEPENDENT_AMBULATORY_CARE_PROVIDER_SITE_OTHER): Payer: Medicaid Other | Admitting: Internal Medicine

## 2014-01-04 DIAGNOSIS — R109 Unspecified abdominal pain: Secondary | ICD-10-CM

## 2014-01-04 LAB — IFOBT (OCCULT BLOOD): IFOBT: NEGATIVE

## 2014-01-04 NOTE — Progress Notes (Signed)
Pt return IFOBT test and it was negative 

## 2014-01-21 LAB — CBC WITH DIFFERENTIAL/PLATELET
BASOS ABS: 0 10*3/uL (ref 0.0–0.1)
Basophils Relative: 0 % (ref 0–1)
Eosinophils Absolute: 0 10*3/uL (ref 0.0–0.7)
Eosinophils Relative: 1 % (ref 0–5)
HEMATOCRIT: 39.7 % (ref 39.0–52.0)
Hemoglobin: 13.6 g/dL (ref 13.0–17.0)
Lymphocytes Relative: 43 % (ref 12–46)
Lymphs Abs: 2 10*3/uL (ref 0.7–4.0)
MCH: 30.8 pg (ref 26.0–34.0)
MCHC: 34.3 g/dL (ref 30.0–36.0)
MCV: 90 fL (ref 78.0–100.0)
MONO ABS: 0.5 10*3/uL (ref 0.1–1.0)
Monocytes Relative: 10 % (ref 3–12)
NEUTROS ABS: 2.1 10*3/uL (ref 1.7–7.7)
Neutrophils Relative %: 46 % (ref 43–77)
PLATELETS: 184 10*3/uL (ref 150–400)
RBC: 4.41 MIL/uL (ref 4.22–5.81)
RDW: 15.1 % (ref 11.5–15.5)
WBC: 4.6 10*3/uL (ref 4.0–10.5)

## 2014-03-18 ENCOUNTER — Ambulatory Visit (INDEPENDENT_AMBULATORY_CARE_PROVIDER_SITE_OTHER): Payer: Medicaid Other | Admitting: Gastroenterology

## 2014-03-18 ENCOUNTER — Encounter (INDEPENDENT_AMBULATORY_CARE_PROVIDER_SITE_OTHER): Payer: Self-pay

## 2014-03-18 ENCOUNTER — Encounter: Payer: Self-pay | Admitting: Gastroenterology

## 2014-03-18 VITALS — BP 121/68 | HR 75 | Temp 97.4°F | Ht 73.0 in | Wt 142.0 lb

## 2014-03-18 DIAGNOSIS — R1013 Epigastric pain: Secondary | ICD-10-CM

## 2014-03-18 DIAGNOSIS — R634 Abnormal weight loss: Secondary | ICD-10-CM | POA: Insufficient documentation

## 2014-03-18 DIAGNOSIS — R112 Nausea with vomiting, unspecified: Secondary | ICD-10-CM | POA: Insufficient documentation

## 2014-03-18 LAB — CBC WITH DIFFERENTIAL/PLATELET
Basophils Absolute: 0 10*3/uL (ref 0.0–0.1)
Basophils Relative: 0 % (ref 0–1)
Eosinophils Absolute: 0.1 10*3/uL (ref 0.0–0.7)
Eosinophils Relative: 1 % (ref 0–5)
HCT: 33.6 % — ABNORMAL LOW (ref 39.0–52.0)
Hemoglobin: 11.3 g/dL — ABNORMAL LOW (ref 13.0–17.0)
LYMPHS ABS: 1.8 10*3/uL (ref 0.7–4.0)
LYMPHS PCT: 24 % (ref 12–46)
MCH: 29.4 pg (ref 26.0–34.0)
MCHC: 33.6 g/dL (ref 30.0–36.0)
MCV: 87.5 fL (ref 78.0–100.0)
Monocytes Absolute: 0.8 10*3/uL (ref 0.1–1.0)
Monocytes Relative: 11 % (ref 3–12)
NEUTROS ABS: 4.7 10*3/uL (ref 1.7–7.7)
NEUTROS PCT: 64 % (ref 43–77)
PLATELETS: 326 10*3/uL (ref 150–400)
RBC: 3.84 MIL/uL — AB (ref 4.22–5.81)
RDW: 15.1 % (ref 11.5–15.5)
WBC: 7.4 10*3/uL (ref 4.0–10.5)

## 2014-03-18 NOTE — Progress Notes (Signed)
Primary Care Physician: Avon Gully, MD  Primary Gastroenterologist:  Roetta Sessions, MD   Chief Complaint  Patient presents with  . Follow-up    HPI: [name] is a 28 y.o. male here for further evaluation of recurrent abdominal pain, vomiting. He was last seen in January 2015. Has a history of erosive reflux esophagitis felt to be the cause of his vomiting. At that time he reported he was doing well as long as he avoided certain foods. He has a history of weight loss. Weight has ranged from 148-155 pounds over the last 12 months. Weight down 3 pounds since 10/2013.   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 No H. pylori seen. He had hemorrhoids and anal papilloma. He's had a prior abdominal ultrasound and HIDA scan without evidence of gallbladder disease.  He has a history of schizophrenia. He lives in a group home. He is by himself in the exam room today. History seems to be fairly reliable. Complains of postprandial upper abdominal pain associated with vomiting. Worse over the last few weeks. Vomiting occurs 2-3 times daily. Lot of heartburn. Pantoprazole 40 mg every morning. BM QOD. No melena, brbpr. Appetite is good but develops abdominal pain afterwards.  History of anemia that resolved as of April 2015. He was occult blood negative.  Current Outpatient Prescriptions  Medication Sig Dispense Refill  . buPROPion (WELLBUTRIN XL) 150 MG 24 hr tablet Take 150 mg by mouth every morning. For depression.      . divalproex (DEPAKOTE) 500 MG DR tablet Take 1,000 mg by mouth 3 (three) times daily.       . haloperidol (HALDOL) 5 MG tablet Take 5-10 mg by mouth 2 (two) times daily. Take 1 tablet by mouth in the morning and take 4 tablets at bedtime      . naproxen (NAPROSYN) 500 MG tablet Take 500 mg by mouth as needed.       . pantoprazole (PROTONIX) 40 MG tablet TAKE 1 TABLET BY MOUTH ONCE DAILY.  30 tablet  11  . Tamsulosin HCl (FLOMAX) 0.4 MG CAPS Take 1 capsule (0.4 mg total) by mouth 2 (two) times daily in the am and at bedtime.. For urinary hesitancy.       No current facility-administered medications for this visit.    Allergies as of 03/18/2014 - Review Complete 03/18/2014  Allergen Reaction Noted  . Acetaminophen Hives 12/25/2011  . Bee pollen  08/19/2013  . Shellfish allergy Hives 12/25/2011    ROS:  General: Negative for anorexia, fever, chills, fatigue, weakness. See history of present illness ENT: Negative for hoarseness, difficulty swallowing , nasal congestion. CV: Negative for chest pain, angina, palpitations, dyspnea on exertion, peripheral edema.  Respiratory: Negative for dyspnea at rest, dyspnea on exertion, cough, sputum, wheezing.  GI: See history of present illness. GU:  Negative for dysuria, hematuria, urinary incontinence, urinary frequency, nocturnal urination.  Endo: See history of present illness  Physical Examination:   BP 121/68  Pulse 75  Temp(Src) 97.4 F (36.3 C) (Oral)  Ht 6\' 1"  (1.854 m)  Wt 142 lb (64.411 kg)  BMI 18.74 kg/m2  General: Well-nourished, well-developed in no acute distress.  Eyes: No icterus. Mouth: Oropharyngeal mucosa moist and pink , no lesions erythema or exudate. Lungs: Clear to auscultation bilaterally.  Heart: Regular rate and rhythm, no murmurs rubs or gallops.  Abdomen:  Bowel sounds are normal, mild to moderate epigastric tenderness, nondistended, no hepatosplenomegaly or masses, no abdominal bruits or hernia , no rebound or guarding.   Extremities: No lower extremity edema. No clubbing or deformities. Neuro: Alert and oriented x 4   Skin: Warm and dry, no jaundice.   Psych: Alert and cooperative, normal mood and affect.

## 2014-03-18 NOTE — Patient Instructions (Signed)
1. Labs as scheduled. 2. CT scan as scheduled.

## 2014-03-18 NOTE — Assessment & Plan Note (Signed)
Complains of recurrent epigastric pain associated with vomiting. Continues to have slow gradual weight loss which has not been explained. History seems reliable but given patient's psychiatric disease it is difficult to determine with clarity his symptoms. At this point would recommend CT abdomen and pelvis with contrast to further evaluate abnormal weight loss, vomiting, abdominal pain. Need to evaluate his pancreas, stomach. Biliary disease seems to be less likely given recent workup last year. We'll also check thyroid status regarding weight loss. Check TSH, lipase, CBC, CMET. Continue current dose of pantoprazole although based on w/u may consider increase to BID. Further recommendations to follow.

## 2014-03-19 LAB — COMPREHENSIVE METABOLIC PANEL
AST: 14 U/L (ref 0–37)
Albumin: 3.7 g/dL (ref 3.5–5.2)
Alkaline Phosphatase: 39 U/L (ref 39–117)
BUN: 7 mg/dL (ref 6–23)
CALCIUM: 9.3 mg/dL (ref 8.4–10.5)
CHLORIDE: 97 meq/L (ref 96–112)
CO2: 29 mEq/L (ref 19–32)
Creat: 0.86 mg/dL (ref 0.50–1.35)
Glucose, Bld: 71 mg/dL (ref 70–99)
Potassium: 4.8 mEq/L (ref 3.5–5.3)
SODIUM: 135 meq/L (ref 135–145)
TOTAL PROTEIN: 6.6 g/dL (ref 6.0–8.3)
Total Bilirubin: 0.4 mg/dL (ref 0.2–1.2)

## 2014-03-19 LAB — LIPASE: Lipase: 13 U/L (ref 0–75)

## 2014-03-19 LAB — TSH: TSH: 1.124 u[IU]/mL (ref 0.350–4.500)

## 2014-03-22 ENCOUNTER — Ambulatory Visit (HOSPITAL_COMMUNITY): Payer: Medicaid Other

## 2014-03-22 NOTE — Progress Notes (Signed)
cc'd to pcp 

## 2014-03-25 NOTE — Progress Notes (Signed)
Quick Note:  Mild anemia again. Other labs OK. Prior EGD/tCS noted. Await CT. ______

## 2014-04-02 ENCOUNTER — Encounter (HOSPITAL_COMMUNITY): Payer: Self-pay | Admitting: Emergency Medicine

## 2014-04-02 ENCOUNTER — Emergency Department (HOSPITAL_COMMUNITY)
Admission: EM | Admit: 2014-04-02 | Discharge: 2014-04-04 | Disposition: A | Discharge status: Home / Self Care | Payer: Medicaid Other | Attending: Emergency Medicine | Admitting: Emergency Medicine

## 2014-04-02 DIAGNOSIS — F29 Unspecified psychosis not due to a substance or known physiological condition: Secondary | ICD-10-CM | POA: Diagnosis present

## 2014-04-02 DIAGNOSIS — D696 Thrombocytopenia, unspecified: Secondary | ICD-10-CM

## 2014-04-02 DIAGNOSIS — G40909 Epilepsy, unspecified, not intractable, without status epilepticus: Secondary | ICD-10-CM | POA: Insufficient documentation

## 2014-04-02 DIAGNOSIS — Z9119 Patient's noncompliance with other medical treatment and regimen: Secondary | ICD-10-CM | POA: Insufficient documentation

## 2014-04-02 DIAGNOSIS — F250 Schizoaffective disorder, bipolar type: Secondary | ICD-10-CM | POA: Diagnosis present

## 2014-04-02 DIAGNOSIS — Z91199 Patient's noncompliance with other medical treatment and regimen due to unspecified reason: Secondary | ICD-10-CM | POA: Insufficient documentation

## 2014-04-02 DIAGNOSIS — K2100 Gastro-esophageal reflux disease with esophagitis, without bleeding: Secondary | ICD-10-CM

## 2014-04-02 DIAGNOSIS — J45909 Unspecified asthma, uncomplicated: Secondary | ICD-10-CM | POA: Insufficient documentation

## 2014-04-02 DIAGNOSIS — Z79899 Other long term (current) drug therapy: Secondary | ICD-10-CM | POA: Insufficient documentation

## 2014-04-02 DIAGNOSIS — R443 Hallucinations, unspecified: Secondary | ICD-10-CM | POA: Insufficient documentation

## 2014-04-02 DIAGNOSIS — F209 Schizophrenia, unspecified: Secondary | ICD-10-CM | POA: Insufficient documentation

## 2014-04-02 DIAGNOSIS — F319 Bipolar disorder, unspecified: Secondary | ICD-10-CM | POA: Insufficient documentation

## 2014-04-02 DIAGNOSIS — Z9114 Patient's other noncompliance with medication regimen: Secondary | ICD-10-CM

## 2014-04-02 DIAGNOSIS — F1010 Alcohol abuse, uncomplicated: Secondary | ICD-10-CM

## 2014-04-02 DIAGNOSIS — D649 Anemia, unspecified: Secondary | ICD-10-CM

## 2014-04-02 DIAGNOSIS — F17200 Nicotine dependence, unspecified, uncomplicated: Secondary | ICD-10-CM | POA: Insufficient documentation

## 2014-04-02 DIAGNOSIS — K5900 Constipation, unspecified: Secondary | ICD-10-CM

## 2014-04-02 DIAGNOSIS — F1210 Cannabis abuse, uncomplicated: Secondary | ICD-10-CM | POA: Insufficient documentation

## 2014-04-02 LAB — COMPREHENSIVE METABOLIC PANEL
ALT: 9 U/L (ref 0–53)
AST: 21 U/L (ref 0–37)
Albumin: 3.9 g/dL (ref 3.5–5.2)
Alkaline Phosphatase: 46 U/L (ref 39–117)
BILIRUBIN TOTAL: 0.5 mg/dL (ref 0.3–1.2)
BUN: 22 mg/dL (ref 6–23)
CALCIUM: 9.8 mg/dL (ref 8.4–10.5)
CO2: 25 meq/L (ref 19–32)
CREATININE: 0.84 mg/dL (ref 0.50–1.35)
Chloride: 101 mEq/L (ref 96–112)
GFR calc non Af Amer: >90 mL/min (ref >90)
Glucose, Bld: 108 mg/dL — ABNORMAL HIGH (ref 70–99)
Potassium: 3.9 mEq/L (ref 3.7–5.3)
Sodium: 138 mEq/L (ref 137–147)
Total Protein: 7.6 g/dL (ref 6.0–8.3)

## 2014-04-02 LAB — CBC
HEMATOCRIT: 35.3 % — AB (ref 39.0–52.0)
Hemoglobin: 11.8 g/dL — ABNORMAL LOW (ref 13.0–17.0)
MCH: 30.2 pg (ref 26.0–34.0)
MCHC: 33.4 g/dL (ref 30.0–36.0)
MCV: 90.3 fL (ref 78.0–100.0)
PLATELETS: 239 10*3/uL (ref 150–400)
RBC: 3.91 MIL/uL — AB (ref 4.22–5.81)
RDW: 14.8 % (ref 11.5–15.5)
WBC: 7.8 10*3/uL (ref 4.0–10.5)

## 2014-04-02 LAB — RAPID URINE DRUG SCREEN, HOSP PERFORMED
AMPHETAMINES: NOT DETECTED
Barbiturates: NOT DETECTED
Benzodiazepines: NOT DETECTED
Cocaine: NOT DETECTED
OPIATES: NOT DETECTED
Tetrahydrocannabinol: POSITIVE — AB

## 2014-04-02 LAB — ACETAMINOPHEN LEVEL: Acetaminophen (Tylenol), Serum: <15 ug/mL (ref 10–30)

## 2014-04-02 LAB — SALICYLATE LEVEL: Salicylate Lvl: <2 mg/dL — ABNORMAL LOW (ref 2.8–20.0)

## 2014-04-02 LAB — ETHANOL: Alcohol, Ethyl (B): <11 mg/dL (ref 0–11)

## 2014-04-02 MED ORDER — ALUM & MAG HYDROXIDE-SIMETH 200-200-20 MG/5ML PO SUSP: 30.0000 mL | ORAL | Status: DC | PRN

## 2014-04-02 MED ORDER — IBUPROFEN 200 MG PO TABS: 600.0000 mg | ORAL_TABLET | Freq: Three times a day (TID) | ORAL | Status: DC | PRN

## 2014-04-02 MED ORDER — DIVALPROEX SODIUM 500 MG PO DR TAB
1500.0000 mg | DELAYED_RELEASE_TABLET | Freq: Every day | ORAL | Status: DC
  Administered 2014-04-02: 1500 mg via ORAL
  Filled 2014-04-02: qty 3

## 2014-04-02 MED ORDER — TRAZODONE HCL 100 MG PO TABS
100.0000 mg | ORAL_TABLET | Freq: Every day | ORAL | Status: DC
  Administered 2014-04-02: 100 mg via ORAL
  Filled 2014-04-02: qty 1

## 2014-04-02 MED ORDER — NICOTINE 21 MG/24HR TD PT24: 21.0000 mg | MEDICATED_PATCH | Freq: Every day | TRANSDERMAL | Status: DC

## 2014-04-02 MED ORDER — BUPROPION HCL ER (XL) 150 MG PO TB24
150.0000 mg | ORAL_TABLET | Freq: Every morning | ORAL | Status: DC
  Administered 2014-04-03 – 2014-04-04 (×2): 150 mg via ORAL
  Filled 2014-04-02 (×2): qty 1

## 2014-04-02 MED ORDER — PANTOPRAZOLE SODIUM 40 MG PO TBEC
40.0000 mg | DELAYED_RELEASE_TABLET | Freq: Every day | ORAL | Status: DC
  Administered 2014-04-03 – 2014-04-04 (×2): 40 mg via ORAL
  Filled 2014-04-02 (×2): qty 1

## 2014-04-02 MED ORDER — HALOPERIDOL DECANOATE 100 MG/ML IM SOLN
200.0000 mg | INTRAMUSCULAR | Status: DC
  Filled 2014-04-02: qty 2

## 2014-04-02 MED ORDER — ZOLPIDEM TARTRATE 5 MG PO TABS: 5.0000 mg | ORAL_TABLET | Freq: Every evening | ORAL | Status: DC | PRN

## 2014-04-02 MED ORDER — HALOPERIDOL 5 MG PO TABS: 5.0000 mg | ORAL_TABLET | Freq: Two times a day (BID) | ORAL | Status: DC

## 2014-04-02 MED ORDER — LORAZEPAM 1 MG PO TABS
1.0000 mg | ORAL_TABLET | Freq: Three times a day (TID) | ORAL | Status: DC | PRN
  Administered 2014-04-04: 1 mg via ORAL
  Filled 2014-04-02: qty 1

## 2014-04-02 MED ORDER — ONDANSETRON HCL 4 MG PO TABS: 4.0000 mg | ORAL_TABLET | Freq: Three times a day (TID) | ORAL | Status: DC | PRN

## 2014-04-02 MED ORDER — NAPROXEN 500 MG PO TABS
500.0000 mg | ORAL_TABLET | Freq: Two times a day (BID) | ORAL | Status: DC
Start: 2014-04-03 — End: 2014-04-04
  Administered 2014-04-03 – 2014-04-04 (×4): 500 mg via ORAL
  Filled 2014-04-02 (×4): qty 1

## 2014-04-02 NOTE — ED Notes (Signed)
Pt belongings include:  Black and green cell phone with cracked screen White and red tennis shoes Blue jean pants with red belt Red and white checkered shirt Cigarettes and lighter Head cover Personal papers  Items entered by DenmarkJena, Advertising copywritermergency Technician 1

## 2014-04-02 NOTE — BH Assessment (Signed)
Received a call for a tele-assessment. Spoke with Antony MaduraKelly Humes, PA-C who reported that patient is under IVC and brought in by [employer]. Patient ran away from his assistant living facility and has been gone for approximately two weeks Pt has been off of his medication for the past two weeks. Patient has a history of epilepsy but denies any recent seizure activity. Pt denies SI/HI but endorses AH that are telling him to hurt others. Assessment will be initiated.

## 2014-04-02 NOTE — ED Notes (Addendum)
Pts Aunt at bedside, [name]. She states pt left group home d/t disagreement over play station.  [phone] Rhonda  [phone] Emelda Brothersoris Herman (Legal Guardian)  [phone] Oneal GroutBeverly Rucker Oneal GroutBeverly Rucker family homes

## 2014-04-02 NOTE — ED Provider Notes (Signed)
CSN: [id number]     Arrival date & time 04/02/14  2148 History  This chart was scribed for Antony MaduraKelly Mitra Duling, PA-C, non-physician practitioner working with Junius ArgyleForrest S Harrison, MD by Nicholos Johnsenise Iheanachor, ED scribe. This patient was seen in room WTR5/WTR5 and the patient's care was started at 10:31 PM.    Chief Complaint  Patient presents with  . Medical Clearance   The history is provided by the patient. No language interpreter was used.   HPI Comments: David Blankenship is a 28 y.o. male w/ hx of seizures, bipolar disorder, schizophrenia, and ADHD presents to the Emergency Department through IVC by [employer] for medical clearance. Walked out of assisted living program in [place] due to boredom 2 weeks ago. Has not been compliant with medications for the past 2 weeks either. Admits to auditory hallucinations; telling him to hurt other people. Admits to daily alcohol consumption and regular marijuana usage. Denies suicidal ideation or homicidal ideation.  Past Medical History  Diagnosis Date  . Asthma   . Seizures   . Bipolar 1 disorder   . Schizophrenia   . ADHD (attention deficit hyperactivity disorder)    Past Surgical History  Procedure Laterality Date  . None    . No past surgeries    . Colonoscopy with esophagogastroduodenoscopy (egd)  10/28/2012    WUJ:WJXBJYRMR:Single tiny distal esophageal erosions consistent with mild erosive reflux esophagitis. Hiatal hernia. 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 no h.pylori   Family History  Problem Relation Age of Onset  . Seizures Father   . Asthma Other   . Seizures Other   . Cancer Other     aunt   History  Substance Use Topics  . Smoking status: Current Every Day Smoker -- 0.50 packs/day for 12 years    Types: Cigarettes  . Smokeless tobacco: Not on file     Comment: 5 or 6 cigarettes daily  . Alcohol  Use: No     Comment: Quit drinking alcoholic beverages. No etoh since 01/2012    Review of Systems  Psychiatric/Behavioral: Negative for suicidal ideas.  All other systems reviewed and are negative.   Allergies  Acetaminophen; Bee pollen; and Shellfish allergy  Home Medications   Prior to Admission medications   Medication Sig Start Date End Date Taking? Authorizing Provider  divalproex (DEPAKOTE) 500 MG DR tablet Take 1,500 mg by mouth at bedtime.    Yes Historical Provider, MD  haloperidol decanoate (HALDOL DECANOATE) 100 MG/ML injection Inject 200 mg into the muscle every 28 (twenty-eight) days.    Yes Historical Provider, MD  naproxen (NAPROSYN) 500 MG tablet Take 500 mg by mouth 2 (two) times daily as needed. pain   Yes Historical Provider, MD  pantoprazole (PROTONIX) 40 MG tablet TAKE 1 TABLET BY MOUTH ONCE DAILY.   Yes Tiffany KocherLeslie S Lewis, PA-C  traZODone (DESYREL) 100 MG tablet Take 100 mg by mouth at bedtime.   Yes Historical Provider, MD   Triage vitals: BP 112/67  Pulse 68  Temp(Src) 97.7 F (36.5 C) (Oral)  Resp 18  Ht 6\' 1"  (1.854 m)  Wt 185 lb (83.915 kg)  BMI 24.41 kg/m2  SpO2 100%  Physical Exam  Nursing note and vitals reviewed. Constitutional: He is oriented to person, place, and time. He appears well-developed and well-nourished. No distress.  Nontoxic/nonseptic appearing  HENT:  Head: Normocephalic and atraumatic.  Eyes: Conjunctivae and EOM are normal. No  scleral icterus.  Neck: Normal range of motion.  Cardiovascular: Normal rate, regular rhythm and intact distal pulses.   Distal radial pulse 2+ in right upper extremity  Pulmonary/Chest: Effort normal. No respiratory distress. He has no wheezes.  Abdominal: Soft. He exhibits no distension. There is no tenderness. There is no rebound.  Soft, nontender  Musculoskeletal: Normal range of motion.  Neurological: He is alert and oriented to person, place, and time.  GCS 15. Patient moves extremities without  ataxia.  Skin: Skin is warm and dry. No rash noted. He is not diaphoretic. No erythema. No pallor.  Psychiatric: He has a normal mood and affect. His behavior is normal. Cognition and memory are normal. He expresses no homicidal and no suicidal ideation. He expresses no suicidal plans and no homicidal plans.  No active A/V hallucinations at present.    ED Course  Procedures (including critical care time) DIAGNOSTIC STUDIES: Oxygen Saturation is 100% on room air, normal by my interpretation.    COORDINATION OF CARE: At 10:35 PM: Discussed treatment plan with patient which includes consultation with Behavioral Health. Patient agrees.   Labs Review Labs Reviewed  CBC - Abnormal; Notable for the following:    RBC 3.91 (*)    Hemoglobin 11.8 (*)    HCT 35.3 (*)    All other components within normal limits  COMPREHENSIVE METABOLIC PANEL - Abnormal; Notable for the following:    Glucose, Bld 108 (*)    All other components within normal limits  SALICYLATE LEVEL - Abnormal; Notable for the following:    Salicylate Lvl <2.0 (*)    All other components within normal limits  URINE RAPID DRUG SCREEN (HOSP PERFORMED) - Abnormal; Notable for the following:    Tetrahydrocannabinol POSITIVE (*)    All other components within normal limits  VALPROIC ACID LEVEL - Abnormal; Notable for the following:    Valproic Acid Lvl <10.0 (*)    All other components within normal limits  ACETAMINOPHEN LEVEL  ETHANOL    Imaging Review No results found.   EKG Interpretation None      MDM   Final diagnoses:  Hallucinations  Noncompliance with medications    Patient presents under ABC for further evaluation and medical clearance. Patient has been missing from his group home in [place] x2 weeks. He states he has been noncompliant with his medications over this period of time. He states he has been having auditory hallucinations as a result. Hallucinations at times tell him to do bad things like  people. Patient denies any SI/HI of present. He states that he has been drinking 3-4 beers daily. He is also been smoking marijuana. Patient evaluated by TTS and medically cleared. He will be observed in the ED overnight and reassessed in the morning.  I personally performed the services described in this documentation, which was scribed in my presence. The recorded information has been reviewed and is accurate.    Filed Vitals:   04/02/14 2203  BP: 112/67  Pulse: 68  Temp: 97.7 F (36.5 C)  TempSrc: Oral  Resp: 18  Height: 6\' 1"  (1.854 m)  Weight: 185 lb (83.915 kg)  SpO2: 100%     Antony MaduraKelly Ralynn San, PA-C 04/03/14 0127

## 2014-04-02 NOTE — ED Notes (Signed)
Pt presents to ED with [employer], pt has been missing from assisted living (group home) in [place] x 2 weeks, pt states he was having issues with staff. Pt has had no meds x 2 weeks. Pt denies SI/HI, endorses auditory hallucinations, rapid speech, rambling at times. Pt is IVC

## 2014-04-03 ENCOUNTER — Encounter (HOSPITAL_COMMUNITY): Payer: Self-pay | Admitting: Psychiatry

## 2014-04-03 DIAGNOSIS — F909 Attention-deficit hyperactivity disorder, unspecified type: Secondary | ICD-10-CM

## 2014-04-03 LAB — VALPROIC ACID LEVEL: Valproic Acid Lvl: <10 ug/mL — ABNORMAL LOW (ref 50.0–100.0)

## 2014-04-03 MED ORDER — DIVALPROEX SODIUM 500 MG PO DR TAB
1000.0000 mg | DELAYED_RELEASE_TABLET | Freq: Every day | ORAL | Status: DC
  Administered 2014-04-03: 1000 mg via ORAL
  Filled 2014-04-03: qty 2

## 2014-04-03 MED ORDER — BENZTROPINE MESYLATE 1 MG PO TABS
0.5000 mg | ORAL_TABLET | Freq: Two times a day (BID) | ORAL | Status: DC
  Filled 2014-04-03 (×2): qty 1

## 2014-04-03 MED ORDER — HALOPERIDOL DECANOATE 100 MG/ML IM SOLN
100.0000 mg | INTRAMUSCULAR | Status: DC
  Administered 2014-04-03: 100 mg via INTRAMUSCULAR
  Filled 2014-04-03: qty 1

## 2014-04-03 MED ORDER — HALOPERIDOL 5 MG PO TABS: 5.0000 mg | ORAL_TABLET | Freq: Every morning | ORAL | Status: DC

## 2014-04-03 MED ORDER — HALOPERIDOL DECANOATE 100 MG/ML IM SOLN: 100.0000 mg | INTRAMUSCULAR | Status: DC

## 2014-04-03 MED ORDER — TAMSULOSIN HCL 0.4 MG PO CAPS
0.4000 mg | ORAL_CAPSULE | Freq: Two times a day (BID) | ORAL | Status: DC
Start: 2014-04-03 — End: 2014-04-04
  Administered 2014-04-03 – 2014-04-04 (×4): 0.4 mg via ORAL
  Filled 2014-04-03 (×5): qty 1

## 2014-04-03 MED ORDER — TRAZODONE HCL 50 MG PO TABS
50.0000 mg | ORAL_TABLET | Freq: Every day | ORAL | Status: DC
  Administered 2014-04-03: 50 mg via ORAL
  Filled 2014-04-03: qty 1

## 2014-04-03 MED ORDER — HALOPERIDOL 5 MG PO TABS: 20.0000 mg | ORAL_TABLET | Freq: Every day | ORAL | Status: DC

## 2014-04-03 NOTE — BH Assessment (Signed)
Assessment Note  David Blankenship is an 28 y.o. male.   Axis I: Schizoaffective disorder, bipolar type Axis II: Deferred Axis III:  Past Medical History  Diagnosis Date  . Asthma   . Seizures   . Bipolar 1 disorder   . Schizophrenia   . ADHD (attention deficit hyperactivity disorder)    Axis IV: other psychosocial or environmental problems Axis V: 21-30 behavior considerably influenced by delusions or hallucinations OR serious impairment in judgment, communication OR inability to function in almost all areas  Past Medical History:  Past Medical History  Diagnosis Date  . Asthma   . Seizures   . Bipolar 1 disorder   . Schizophrenia   . ADHD (attention deficit hyperactivity disorder)     Past Surgical History  Procedure Laterality Date  . None    . No past surgeries    . Colonoscopy with esophagogastroduodenoscopy (egd)  10/28/2012    ZOX:WRUEAVRMR:Single tiny distal esophageal erosions consistent with mild erosive reflux esophagitis. Hiatal hernia. 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 no h.pylori    Family History:  Family History  Problem Relation Age of Onset  . Seizures Father   . Asthma Other   . Seizures Other   . Cancer Other     aunt    Social History:  reports that he has been smoking Cigarettes.  He has a 6 pack-year smoking history. He does not have any smokeless tobacco history on file. He reports that he uses illicit drugs (Marijuana). He reports that he does not drink alcohol.  Additional Social History:  Alcohol / Drug Use History of alcohol / drug use?: No history of alcohol / drug abuse  CIWA: CIWA-Ar BP: 112/67 mmHg Pulse Rate: 68 COWS:    Allergies:  Allergies  Allergen Reactions  . Acetaminophen Hives  . Bee Pollen   . Shellfish Allergy Hives    Home Medications:  (Not in a hospital  admission)  OB/GYN Status:  No LMP for male patient.  General Assessment Data Location of Assessment: [facility] Is this a Tele or Face-to-Face Assessment?: Face-to-Face Is this an Initial Assessment or a Re-assessment for this encounter?: Initial Assessment Living Arrangements: Other (Comment) (Assisted living facility ) Can pt return to current living arrangement?: Yes Admission Status: Involuntary Is patient capable of signing voluntary admission?: No Transfer from: Acute Hospital     [facility] Living Arrangements: Other (Comment) (Assisted living facility ) Name of Psychiatrist: [facility]  Name of Therapist: [facility]      Risk to self Suicidal Ideation: No Suicidal Intent: No Is patient at risk for suicide?: No Suicidal Plan?: No Access to Means: No What has been your use of drugs/alcohol within the last 12 months?: no use reported Previous Attempts/Gestures: Yes How many times?: 1 Other Self Harm Risks: none  Triggers for Past Attempts: Unpredictable Intentional Self Injurious Behavior: None Family Suicide History: No Recent stressful life event(s): Conflict (Comment) (Placement at the assisted living facility. ) Persecutory voices/beliefs?: No Depression: Yes Depression Symptoms: Insomnia Substance abuse history and/or treatment for substance abuse?: No Suicide prevention information given to non-admitted patients: Not applicable  Risk to Others Homicidal Ideation: No Thoughts of Harm to Others: No Current Homicidal Intent: No Current Homicidal Plan: No Access to Homicidal Means: No Identified Victim: NA History of harm to others?: No Assessment of Violence: None Noted Violent Behavior Description: no violent behavior reported Does  patient have access to weapons?: No Criminal Charges Pending?: Yes Describe Pending Criminal Charges: Panhandling Does patient have a court date: Yes Court Date: 05/13/14 (Room 1B)  Psychosis Hallucinations:  Auditory Delusions: None noted  Mental Status Report Appear/Hygiene: In scrubs Eye Contact: Good Motor Activity: Freedom of movement Speech: Logical/coherent;Rapid Level of Consciousness: Alert Mood: Pleasant Affect: Appropriate to circumstance Anxiety Level: None Thought Processes: Relevant;Coherent Judgement: Unimpaired Orientation: Appropriate for developmental age Obsessive Compulsive Thoughts/Behaviors: None  Cognitive Functioning Concentration: Normal Memory: Recent Intact IQ: Average Insight: Good Impulse Control: Fair Appetite: Good Weight Loss: 0 Weight Gain: 0 Sleep: No Change Total Hours of Sleep: 8 Vegetative Symptoms: None  ADLScreening [facility] Assessment Services) Patient's cognitive ability adequate to safely complete daily activities?: Yes Patient able to express need for assistance with ADLs?: Yes Independently performs ADLs?: Yes (appropriate for developmental age)  Prior Inpatient Therapy Prior Inpatient Therapy: Yes Prior Therapy Dates: 2012 Prior Therapy Facilty/Provider(s): [facility] Reason for Treatment: UTA  Prior Outpatient Therapy Prior Outpatient Therapy: No  ADL Screening (condition at time of admission) Patient's cognitive ability adequate to safely complete daily activities?: Yes Is the patient deaf or have difficulty hearing?: No Does the patient have difficulty seeing, even when wearing glasses/contacts?: No Does the patient have difficulty concentrating, remembering, or making decisions?: No Patient able to express need for assistance with ADLs?: Yes Does the patient have difficulty dressing or bathing?: No Independently performs ADLs?: Yes (appropriate for developmental age) Is this a change from baseline?: Pre-admission baseline  Home Assistive Devices/Equipment Home Assistive Devices/Equipment: None    Abuse/Neglect Assessment (Assessment to be complete while patient is alone) Physical Abuse: Denies Verbal Abuse: Denies Sexual  Abuse: Denies Exploitation of patient/patient's resources: Denies Self-Neglect: Denies Values / Beliefs Cultural Requests During Hospitalization: None Spiritual Requests During Hospitalization: None        Additional Information 1:1 In Past 12 Months?: No CIRT Risk: No Elopement Risk: No Does patient have medical clearance?: Yes     Disposition:  Disposition Initial Assessment Completed for this Encounter: Yes Disposition of Patient: Other dispositions Other disposition(s): Other (Comment) (Observation in ED and re-evaluated in the morning.)  On Site Evaluation by:   Reviewed with Physician:    Lahoma RockerSims,Drinda Belgard S 04/03/2014 12:19 AM

## 2014-04-03 NOTE — Progress Notes (Addendum)
CSW left message 2x for David Blankenship at [phone].  David Blankenship LCSWA,     ED CSW  phone: [phone] 1:33pm __________  CSW spoke with pt's guardian, David Blankenship and David Blankenship, group home owner. Both expressed concern that if pt were released tonight, pt would not stay at group home--that he would leave group home and run away as he did two weeks ago. Both requested that pt stay for 1-2 more nights for further med stabilization. Both stated that pt had improved markedly overnight since with medicine, but was not near his baseline yet. [name] states that she will accept pt back when MD states he is cleared.  Psych NP agrees to monitor overnight for stabilization.   CSW communicated plan to pt. Pt states that he wants to leave hospital as soon as possible, and that he understands this may be Sunday or Monday. CSW provided supportive counseling.  David LasterAlexandra Ames Hoban LCSWA,     ED CSW  phone: [phone] 04/03/2014

## 2014-04-03 NOTE — Consult Note (Signed)
Involuntary paperwork reviewed. IVC states patient has been off his Trazodone and Depakote and is very paranoid. Patient is also hearing voices and talking back to the voices.  Patient states that he last received his Haldol injection on Feb 22, 2014.  States he did not receive his June injection because [facility] and not at [facility].   Spoke with Tommy at [facility] at [phone] to confirm patient's medications and dosages. He also confirmed that patient did not reeive his June Haldol injection.     The following are the patient's medications as of May, 2015: Flomax 0.4 mg PO  BID Depakote 1000 mg PO QD Trazodone 50 mg PO QHS Wellbutrin 150 mg PO QD Protonix 40 mg PO QD Haldol injection 100 mg IM every month Haldol 5 mg PO QAM Haldol 20 mg PO QPM  Will order patient's Haldol injection and other home meds.  Alberteen SamFran Mistie Adney, FNP-BC

## 2014-04-03 NOTE — BH Assessment (Signed)
[facility]end[facility] to [facility] per David Blankenship, Counselor:  [facility] after being IVC by his aunt due to leaving his assisted living facility and not taking his medication for the past two weeks. PT stated "I haven't had my medication for two weeks, I am doing fine". Pt also stated "I haven't heard any voices in two days". "I just need to get back on my medications".  Pt is alert and oriented x3. Pt is calm and cooperative throughout this assessment. Pt denies SI, HI, AH and VH at this time. Pt reported that he usually hears voices daily but denies hearing them the past two days. Pt's aunt reported that he has attempted when he was a teenager. [facility] but did not provide the reason. Pt is [facility] but reported that he missed his most recent Haldol injection. Pt has two pending charges for panhandling and an upcoming court date on July 23rd. Pt denies any illicit substance use at present time. Pt's aunt reported that patient has been in an out of group home placements his entire life. Pt reported that he was bored and just left the assisted living facility and [place] able to [place] and make some money.

## 2014-04-03 NOTE — Consult Note (Signed)
[facility] Face-to-Face Psychiatry Consult   Reason for Consult:  Mood instability Referring Physician:  EDP David Blankenship is an 28 y.o. male. Total Time spent with patient: 20 minutes  Assessment: AXIS I:  ADHD, hyperactive type and Schizoaffective Disorder AXIS II:  Deferred AXIS III:   Past Medical History  Diagnosis Date  . Asthma   . Seizures   . Bipolar 1 disorder   . Schizophrenia   . ADHD (attention deficit hyperactivity disorder)    AXIS IV:  other psychosocial or environmental problems, problems related to social environment and problems with primary support group AXIS V:  61-70 mild symptoms  Plan:  No evidence of imminent risk to self or others at present.  Dr. Louretta Shorten assessed the patient and concurs with the plan.  Subjective:   David Blankenship is a 28 y.o. male patient does not warrant admission.  HPI:  Patient had been off his medications for two weeks after he left his group home.  He restarted his medications last night including an injection of Haldol deconate.  David Blankenship denies suicidal/homicidal ideations and hallucinations.  He talked to his grandmother last night and she talked to his group home who will take him back.  David Blankenship wants to go back and states he feels good after his medications, food, and sleep. HPI Elements:   Location:  generalized. Quality:  acute. Severity:  severe. Timing:  intermittent. Duration:  two weeks. Context:  stressors.  Past Psychiatric History: Past Medical History  Diagnosis Date  . Asthma   . Seizures   . Bipolar 1 disorder   . Schizophrenia   . ADHD (attention deficit hyperactivity disorder)     reports that he has been smoking Cigarettes.  He has a 6 pack-year smoking history. He does not have any smokeless tobacco history on file. He reports that he uses illicit drugs (Marijuana). He reports that he does not drink alcohol. Family History  Problem Relation Age of Onset  . Seizures Father   . Asthma Other    . Seizures Other   . Cancer Other     aunt   Family History Substance Abuse: No Family Supports: Yes, List: Magazine features editor and aunt) Living Arrangements: Other (Comment) (Assisted living facility ) Can pt return to current living arrangement?: Yes Abuse/Neglect [facility]) Physical Abuse: Denies Verbal Abuse: Denies Sexual Abuse: Denies Allergies:   Allergies  Allergen Reactions  . Acetaminophen Hives  . Bee Pollen   . Shellfish Allergy Hives    ACT Assessment Complete:  Yes:    Educational Status    Risk to Self: Risk to self Suicidal Ideation: No Suicidal Intent: No Is patient at risk for suicide?: No Suicidal Plan?: No Access to Means: No What has been your use of drugs/alcohol within the last 12 months?: no use reported Previous Attempts/Gestures: Yes How many times?: 1 Other Self Harm Risks: none  Triggers for Past Attempts: Unpredictable Intentional Self Injurious Behavior: None Family Suicide History: No Recent stressful life event(s): Conflict (Comment) (Placement at the assisted living facility. ) Persecutory voices/beliefs?: No Depression: Yes Depression Symptoms: Insomnia Substance abuse history and/or treatment for substance abuse?: Yes (UDS positive for THC) Suicide prevention information given to non-admitted patients: Not applicable  Risk to Others: Risk to Others Homicidal Ideation: No Thoughts of Harm to Others: No Current Homicidal Intent: No Current Homicidal Plan: No Access to Homicidal Means: No Identified Victim: NA History of harm to others?: No Assessment of Violence: None Noted Violent Behavior Description: no violent behavior  reported Does patient have access to weapons?: No Criminal Charges Pending?: Yes Describe Pending Criminal Charges: Panhandling Does patient have a court date: Yes Court Date: 05/13/14 (Room 1B)  Abuse: Abuse/Neglect Assessment (Assessment to be complete while patient is alone) Physical Abuse: Denies Verbal Abuse:  Denies Sexual Abuse: Denies Exploitation of patient/patient's resources: Denies Self-Neglect: Denies  Prior Inpatient Therapy: Prior Inpatient Therapy Prior Inpatient Therapy: Yes Prior Therapy Dates: 2012 Prior Therapy Facilty/Provider(s): [facility] Reason for Treatment: UTA  Prior Outpatient Therapy: Prior Outpatient Therapy Prior Outpatient Therapy: No  Additional Information: Additional Information 1:1 In Past 12 Months?: No CIRT Risk: No Elopement Risk: No Does patient have medical clearance?: Yes                  Objective: Blood pressure 111/50, pulse 60, temperature 97.4 F (36.3 C), temperature source Oral, resp. rate 16, height $RemoveBe'6\' 1"'CmFkIzgwo$  (1.854 m), weight 185 lb (83.915 kg), SpO2 100.00%.Body mass index is 24.41 kg/(m^2). Results for orders placed during the hospital encounter of 04/02/14 (from the past 72 hour(s))  ACETAMINOPHEN LEVEL     Status: None   Collection Time    04/02/14 10:30 PM      Result Value Ref Range   Acetaminophen (Tylenol), Serum <15.0  10 - 30 ug/mL   Comment:            THERAPEUTIC CONCENTRATIONS VARY     SIGNIFICANTLY. A RANGE OF 10-30     ug/mL MAY BE AN EFFECTIVE     CONCENTRATION FOR MANY PATIENTS.     HOWEVER, SOME ARE BEST TREATED     AT CONCENTRATIONS OUTSIDE THIS     RANGE.     ACETAMINOPHEN CONCENTRATIONS     >150 ug/mL AT 4 HOURS AFTER     INGESTION AND >50 ug/mL AT 12     HOURS AFTER INGESTION ARE     OFTEN ASSOCIATED WITH TOXIC     REACTIONS.  CBC     Status: Abnormal   Collection Time    04/02/14 10:30 PM      Result Value Ref Range   WBC 7.8  4.0 - 10.5 K/uL   RBC 3.91 (*) 4.22 - 5.81 MIL/uL   Hemoglobin 11.8 (*) 13.0 - 17.0 g/dL   HCT 35.3 (*) 39.0 - 52.0 %   MCV 90.3  78.0 - 100.0 fL   MCH 30.2  26.0 - 34.0 pg   MCHC 33.4  30.0 - 36.0 g/dL   RDW 14.8  11.5 - 15.5 %   Platelets 239  150 - 400 K/uL  COMPREHENSIVE METABOLIC PANEL     Status: Abnormal   Collection Time    04/02/14 10:30 PM      Result Value Ref  Range   Sodium 138  137 - 147 mEq/L   Potassium 3.9  3.7 - 5.3 mEq/L   Chloride 101  96 - 112 mEq/L   CO2 25  19 - 32 mEq/L   Glucose, Bld 108 (*) 70 - 99 mg/dL   BUN 22  6 - 23 mg/dL   Creatinine, Ser 0.84  0.50 - 1.35 mg/dL   Calcium 9.8  8.4 - 10.5 mg/dL   Total Protein 7.6  6.0 - 8.3 g/dL   Albumin 3.9  3.5 - 5.2 g/dL   AST 21  0 - 37 U/L   ALT 9  0 - 53 U/L   Alkaline Phosphatase 46  39 - 117 U/L   Total Bilirubin 0.5  0.3 - 1.2  mg/dL   GFR calc non Af Amer >90  >90 mL/min   GFR calc Af Amer >90  >90 mL/min   Comment: (NOTE)     The eGFR has been calculated using the CKD EPI equation.     This calculation has not been validated in all clinical situations.     eGFR's persistently <90 mL/min signify possible Chronic Kidney     Disease.  ETHANOL     Status: None   Collection Time    04/02/14 10:30 PM      Result Value Ref Range   Alcohol, Ethyl (B) <11  0 - 11 mg/dL   Comment:            LOWEST DETECTABLE LIMIT FOR     SERUM ALCOHOL IS 11 mg/dL     FOR MEDICAL PURPOSES ONLY  SALICYLATE LEVEL     Status: Abnormal   Collection Time    04/02/14 10:30 PM      Result Value Ref Range   Salicylate Lvl <2.0 (*) 2.8 - 20.0 mg/dL  VALPROIC ACID LEVEL     Status: Abnormal   Collection Time    04/02/14 10:30 PM      Result Value Ref Range   Valproic Acid Lvl <10.0 (*) 50.0 - 100.0 ug/mL   Comment: Performed at [facility]  URINE RAPID DRUG SCREEN (HOSP PERFORMED)     Status: Abnormal   Collection Time    04/02/14 10:48 PM      Result Value Ref Range   Opiates NONE DETECTED  NONE DETECTED   Cocaine NONE DETECTED  NONE DETECTED   Benzodiazepines NONE DETECTED  NONE DETECTED   Amphetamines NONE DETECTED  NONE DETECTED   Tetrahydrocannabinol POSITIVE (*) NONE DETECTED   Barbiturates NONE DETECTED  NONE DETECTED   Comment:            DRUG SCREEN FOR MEDICAL PURPOSES     ONLY.  IF CONFIRMATION IS NEEDED     FOR ANY PURPOSE, NOTIFY LAB     WITHIN 5 DAYS.                 LOWEST DETECTABLE LIMITS     FOR URINE DRUG SCREEN     Drug Class       Cutoff (ng/mL)     Amphetamine      1000     Barbiturate      200     Benzodiazepine   200     Tricyclics       300     Opiates          300     Cocaine          300     THC              50   Labs are reviewed and are pertinent for no medical issues noted.  Current Facility-Administered Medications  Medication Dose Route Frequency Provider Last Rate Last Dose  . alum & mag hydroxide-simeth (MAALOX/MYLANTA) 200-200-20 MG/5ML suspension 30 mL  30 mL Oral PRN Antony Madura, PA-C      . benztropine (COGENTIN) tablet 0.5 mg  0.5 mg Oral BID Nanine Means, NP      . buPROPion (WELLBUTRIN XL) 24 hr tablet 150 mg  150 mg Oral q morning - 10a Antony Madura, PA-C   150 mg at 04/03/14 1027  . divalproex (DEPAKOTE) DR tablet 1,000 mg  1,000 mg Oral QHS Kristeen Mans,  NP      . haloperidol (HALDOL) tablet 5 mg  5 mg Oral q morning - 10a Lurena Nida, NP      . haloperidol decanoate (HALDOL DECANOATE) 100 MG/ML injection 100 mg  100 mg Intramuscular Q28 days Lurena Nida, NP   100 mg at 04/03/14 0111  . LORazepam (ATIVAN) tablet 1 mg  1 mg Oral Q8H PRN Antonietta Breach, PA-C      . naproxen (NAPROSYN) tablet 500 mg  500 mg Oral BID WC Antonietta Breach, PA-C   500 mg at 04/03/14 0858  . nicotine (NICODERM CQ - dosed in mg/24 hours) patch 21 mg  21 mg Transdermal Daily Antonietta Breach, PA-C      . ondansetron [facility]) tablet 4 mg  4 mg Oral Q8H PRN Antonietta Breach, PA-C      . pantoprazole (PROTONIX) EC tablet 40 mg  40 mg Oral Daily Antonietta Breach, PA-C   40 mg at 04/03/14 1027  . tamsulosin (FLOMAX) capsule 0.4 mg  0.4 mg Oral BID Lurena Nida, NP   0.4 mg at 04/03/14 0858  . traZODone (DESYREL) tablet 50 mg  50 mg Oral QHS Lurena Nida, NP       Current Outpatient Prescriptions  Medication Sig Dispense Refill  . divalproex (DEPAKOTE) 500 MG DR tablet Take 1,500 mg by mouth at bedtime.       . haloperidol decanoate (HALDOL DECANOATE) 100 MG/ML  injection Inject 200 mg into the muscle every 28 (twenty-eight) days.       . naproxen (NAPROSYN) 500 MG tablet Take 500 mg by mouth 2 (two) times daily as needed. pain      . pantoprazole (PROTONIX) 40 MG tablet TAKE 1 TABLET BY MOUTH ONCE DAILY.  30 tablet  11  . traZODone (DESYREL) 100 MG tablet Take 100 mg by mouth at bedtime.        Psychiatric Specialty Exam:     Blood pressure 111/50, pulse 60, temperature 97.4 F (36.3 C), temperature source Oral, resp. rate 16, height $RemoveBe'6\' 1"'FLmpcLwkG$  (1.854 m), weight 185 lb (83.915 kg), SpO2 100.00%.Body mass index is 24.41 kg/(m^2).  General Appearance: Casual  Eye Contact::  Good  Speech:  Normal Rate  Volume:  Normal  Mood:  Anxious  Affect:  Congruent  Thought Process:  Coherent  Orientation:  Full (Time, Place, and Person)  Thought Content:  WDL  Suicidal Thoughts:  No  Homicidal Thoughts:  No  Memory:  Immediate;   Fair Recent;   Fair Remote;   Fair  Judgement:  Fair  Insight:  Fair  Psychomotor Activity:  Normal  Concentration:  Fair  Recall:  [employer] [place]: Fair  Akathisia:  No  Handed:  Right  AIMS (if indicated):     Assets:  Housing Leisure Time Physical Health Resilience Social Support  Sleep:      Musculoskeletal: Strength & Muscle Tone: within normal limits Gait & Station: normal Patient leans: N/A  Treatment Plan Summary: Daily contact with patient to assess and evaluate symptoms and progress in treatment Medication management; group home contacted but wants him to be more regulated on his medications, will observe one more night.  Waylan Boga, [place] 04/03/2014 2:58 PM  Patient is seen face to face for psychiatric evaluation and case discussed with treatment team including physician extender and formulated treatment plan. Reviewed the information documented and agree with the treatment plan.  Ilyssa Grennan,JANARDHAHA R. 04/03/2014 3:26 PM

## 2014-04-03 NOTE — ED Provider Notes (Signed)
Medical screening examination/treatment/procedure(s) were performed by non-physician practitioner and as supervising physician I was immediately available for consultation/collaboration.   EKG Interpretation None        Junius ArgyleForrest S Hajra Port, MD 04/03/14 1140

## 2014-04-04 ENCOUNTER — Encounter (HOSPITAL_COMMUNITY): Payer: Self-pay | Admitting: Registered Nurse

## 2014-04-04 DIAGNOSIS — R443 Hallucinations, unspecified: Secondary | ICD-10-CM

## 2014-04-04 DIAGNOSIS — F259 Schizoaffective disorder, unspecified: Secondary | ICD-10-CM

## 2014-04-04 MED ORDER — HALOPERIDOL 5 MG PO TABS: 5.0000 mg | ORAL_TABLET | Freq: Every morning | ORAL | Status: DC

## 2014-04-04 MED ORDER — BUPROPION HCL ER (XL) 150 MG PO TB24: 150.0000 mg | ORAL_TABLET | Freq: Every morning | ORAL | Status: DC

## 2014-04-04 MED ORDER — TRAZODONE HCL 50 MG PO TABS: 50.0000 mg | ORAL_TABLET | Freq: Every day | ORAL | Status: DC

## 2014-04-04 MED ORDER — DIVALPROEX SODIUM 500 MG PO DR TAB: 1000.0000 mg | DELAYED_RELEASE_TABLET | Freq: Every day | ORAL | Status: DC

## 2014-04-04 MED ORDER — HALOPERIDOL DECANOATE 100 MG/ML IM SOLN: 100.0000 mg | INTRAMUSCULAR | Status: DC

## 2014-04-04 MED ORDER — TAMSULOSIN HCL 0.4 MG PO CAPS: 0.4000 mg | ORAL_CAPSULE | Freq: Two times a day (BID) | ORAL | Status: DC

## 2014-04-04 MED ORDER — BENZTROPINE MESYLATE 0.5 MG PO TABS: 0.5000 mg | ORAL_TABLET | Freq: Two times a day (BID) | ORAL | Status: DC

## 2014-04-04 NOTE — Discharge Instructions (Signed)
Schizoaffective Disorder Schizoaffective disorder (ScAD) is a mental illness. It causes symptoms that are a mixture of schizophrenia (a psychotic disorder) and an affective (mood) disorder. The schizophrenic symptoms may include delusions, hallucinations, or odd behavior. The mood symptoms may be similar to major depression or bipolar disorder. ScAD may interfere with personal relationships or normal daily activities. People with ScAD are at increased risk for job loss, social isolation,physical health problems, anxiety and substance use disorders, and suicide. ScAD usually occurs in cycles. Periods of severe symptoms are followed by periods of less severe symptoms or improvement. The illness affects men and women equally but usually appears at an earlier age (teenage or early adult years) in men. People who have family members with schizophrenia, bipolar disorder, or ScAD are at higher risk of developing ScAD. SYMPTOMS  At any one time, people with ScAD may have psychotic symptoms only or both psychotic and mood symptoms. The psychotic symptoms include one or more of the following:  Hearing, seeing, or feeling things that are not there (hallucinations).   Having fixed, false beliefs (delusions). The delusions usually are of being attacked, harassed, cheated, persecuted, or conspired against (paranoid delusions).  Speaking in a way that makes no sense to others (disorganized speech). The psychotic symptoms of ScAD may also include confusing or odd behavior or any of the negative symptoms of schizophrenia. These include loss of motivation for normal daily activities, such as bathing or grooming, withdrawal from other people, and lack of emotions.  The mood symptoms of ScAD occur more often than not. They resemble major depressive disorder or bipolar mania. Symptoms of major depression include depressed mood and four or more of the following:  Loss of interest in usually pleasurable activities  (anhedonia).  Sleeping more or less than normal.  Feeling worthless or excessively guilty.  Lack of energy or motivation.  Trouble concentrating.  Eating more or less than usual.  Thinking a lot about death or suicide. Symptoms of bipolar mania include abnormally elevated or irritable mood and increased energy or activity, plus three or more of the following:   More confidence than normal or feeling that you are able to do anything (grandiosity).  Feeling rested with less sleep than normal.   Being easily distracted.   Talking more than usual or feeling pressured to keep talking.   Feeling that your thoughts are racing.  Engaging in high-risk activities such as buying sprees or foolish business decisions. DIAGNOSIS  ScAD is diagnosed through an assessment by your health care provider. Your health care provider will observe and ask questions about your thoughts, behavior, mood, and ability to function in daily life. Your health care provider may also ask questions about your medical history and use of drugs, including prescription medicines. Your health care provider may also order blood tests and imaging exams. Certain medical conditions and substances can cause symptoms that resemble ScAD. Your health care provider may refer you to a mental health specialist for evaluation.  ScAD is divided into two types. The depressive type is diagnosed if your mood symptoms are limited to major depression. The bipolar type is diagnosed if your mood symptoms are manic or a mixture of manic and depressive symptoms TREATMENT  ScAD is usually a life-long illness. Long-term treatment is necessary. The following treatments are available:  Medicine Different types of medicine are used to treat ScAD. The exact combination depends on the type and severity of your symptoms. Antipsychotic medicine is used to control psychotic symptomssuch as delusions, paranoia,  and hallucinations. Mood stabilizers can  even the highs and lows of bipolar manic mood swings. Antidepressant medicines are used to treat major depressive symptoms.  Counseling or talk therapy Individual, group, or family counseling may be helpful in providing education, support, and guidance. Many people with ScAD also benefit from social skills and job skills (vocational) training. A combination of medicine and counseling is usually best for managing the disorder over time. A procedure in which electricity is applied to the brain through the scalp (electroconvulsive therapy) may be used to treat people with severe manic symptoms that do not respond to medicine and counseling. HOME CARE INSTRUCTIONS   Take all your medicine as prescribed.  Check with your health care provider before starting new prescription or over-the-counter medicines.  Keep all follow up appointments with your health care provider. SEEK MEDICAL CARE IF:   If you are not able to take your medicines as prescribed.  If your symptoms get worse. SEEK IMMEDIATE MEDICAL CARE IF:   You have serious thoughts about hurting yourself or others. Document Released: 02/18/2007 Document Revised: 07/29/2013 Document Reviewed: 05/22/2013 [facility] Patient Information 2014 [employer], [place].  Substance Abuse Your exam indicates that you have a problem with substance abuse. Substance abuse is the misuse of alcohol or drugs that causes problems in family life, friendships, and work relationships. Substance abuse is the most important cause of premature illness, disability, and death in our society. It is also the greatest threat to a person's mental and spiritual well being. Substance abuse can start out in an innocent way, such as social drinking or taking a little extra medication prescribed by your doctor. No one starts out with the intention of becoming an alcoholic or an addict. Substance abuse victims cannot control their use of alcohol or drugs. They may become intoxicated  daily or go on weekend binges. Often there is a strong desire to quit, but attempts to stop using often fail. Encounters with law enforcement or conflicts with family members, friends, and work associates are signs of a potential problem. Recovery is always possible, although the craving for some drugs makes it difficult to quit without assistance. Many treatment programs are available to help people stop abusing alcohol or drugs. The first step in treatment is to admit you have a problem. This is a major hurdle because denial is a powerful force with substance abuse. Alcoholics Anonymous, Narcotics Anonymous, Cocaine Anonymous, and other recovery groups and programs can be very useful in helping people to quit. If you do not feel okay about your drug or alcohol use and if it is causing you trouble, we want to encourage you to talk about it with your doctor or with someone from a recovery group who can help you. You could also call the [employer] at 1-800-662-HELP. It is up to you to take the first step. AL-ANON and ALA-TEEN are support groups for friends and family members of an alcohol or drug dependent person. The people who love and care for the alcoholic or addicted person often need help, too. For information about these organizations, check your phone directory or call a local alcohol or drug treatment center. Document Released: 11/15/2004 Document Revised: 12/31/2011 Document Reviewed: 10/09/2008 [facility] Patient Information 2014 [place], [place].

## 2014-04-04 NOTE — ED Notes (Signed)
Staff from [facility] here to transport back to group home. Ambulatory without difficulty. Denies pain. No complaints voiced. Escorted by mental health tech to front of hospital. D/C instructions and prescriptions given to [facility] staff.

## 2014-04-04 NOTE — Progress Notes (Signed)
CSW spoke with Oneal GroutBeverly Rucker [phone]). [facility]  Mariann LasterAlexandra Kasara Schomer LCSWA,     ED CSW  phone: [phone] 2:45pm

## 2014-04-04 NOTE — [facility] Suicide Risk Assessment (Cosign Needed)
Suicide Risk Assessment  Discharge Assessment     Demographic Factors:  Male  Total Time spent with patient: 20 minutes  Mental Status Per Nursing Assessment::   On Admission:    Psychiatric Specialty Exam:      Blood pressure 103/67, pulse 81, temperature 97.9 F (36.6 C), temperature source Oral, resp. rate 18, height 6\' 1"  (1.854 m), weight 83.915 kg (185 lb), SpO2 100.00%.Body mass index is 24.41 kg/(m^2).   General Appearance: Casual   Eye Contact:: Good   Speech: Normal Rate   Volume: Normal   Mood: Anxious   Affect: Congruent   Thought Process: Coherent   Orientation: Full (Time, Place, and Person)   Thought Content: WDL   Suicidal Thoughts: No   Homicidal Thoughts: No   Memory: Immediate; Fair  Recent; Fair  Remote; Fair   Judgement: Fair   Insight: Fair   Psychomotor Activity: Normal   Concentration: Fair   Recall: [employer]Fair   Fund of Knowledge:Fair   Language: Fair   Akathisia: No   Handed: Right   AIMS (if indicated):   Assets: Housing  Leisure Time  Physical Health  Resilience  Social Support   Sleep:   Musculoskeletal:  Strength & Muscle Tone: within normal limits  Gait & Station: normal  Patient leans: N/A     Current Mental Status by Physician: Patient denies suicidal/homicidal ideation, psychosis, and paranoia  Loss Factors: NA  Historical Factors: NA  Risk Reduction Factors:   Sense of responsibility to family  Continued Clinical Symptoms:  Previous Psychiatric Diagnoses and Treatments  Cognitive Features That Contribute To Risk:  Loss of executive function    Suicide Risk:  Minimal: No identifiable suicidal ideation.  Patients presenting with no risk factors but with morbid ruminations; may be classified as minimal risk based on the severity of the depressive symptoms  AXIS I: ADHD, hyperactive type and Schizoaffective Disorder  AXIS II: Deferred  AXIS III:  Past Medical History   Diagnosis  Date   .  Asthma    .  Seizures     .  Bipolar 1 disorder    .  Schizophrenia    .  ADHD (attention deficit hyperactivity disorder)     AXIS IV: other psychosocial or environmental problems, problems related to social environment and problems with primary support group  AXIS V: 61-70 mild symptoms    Discharge Diagnoses:    Plan Of Care/Follow-up recommendations:  Activity:  Resume usual activity Diet:  Resume usual diet  Is patient on multiple antipsychotic therapies at discharge:  No   Has Patient had three or more failed trials of antipsychotic monotherapy by history:  No  Recommended Plan for Multiple Antipsychotic Therapies: NA  Christifer Chapdelaine, FNP-BC 04/04/2014, 2:21 PM

## 2014-04-04 NOTE — Consult Note (Signed)
[facility] Follow Up Psychiatry Consult   Reason for Consult:  Mood instability Referring Physician:  EDP ROBERTH Blankenship is an 28 y.o. male. Total Time spent with patient: 20 minutes  Assessment: AXIS I:  ADHD, hyperactive type and Schizoaffective Disorder AXIS II:  Deferred AXIS III:   Past Medical History  Diagnosis Date  . Asthma   . Seizures   . Bipolar 1 disorder   . Schizophrenia   . ADHD (attention deficit hyperactivity disorder)    AXIS IV:  other psychosocial or environmental problems, problems related to social environment and problems with primary support group AXIS V:  61-70 mild symptoms  Plan:  No evidence of imminent risk to self or others at present.  Dr. Louretta Blankenship assessed the patient and concurs with the plan.  Subjective:   David Blankenship is a 28 y.o. male patient does not warrant admission. ."  HPI:  Patient states "I been taking my medicine; I'm doing good, I slept good last night, I'm not hearing no voices, I ain't paranoid or nothing like that.  I ate my breakfast this morning.  You can call my grandma or David Blankenship; but David Blankenship is at church right now."   HPI Elements:   Location:  generalized. Quality:  acute. Severity:  severe. Timing:  intermittent. Duration:  two weeks. Context:  stressors.  Past Psychiatric History: Past Medical History  Diagnosis Date  . Asthma   . Seizures   . Bipolar 1 disorder   . Schizophrenia   . ADHD (attention deficit hyperactivity disorder)     reports that he has been smoking Cigarettes.  He has a 6 pack-year smoking history. He does not have any smokeless tobacco history on file. He reports that he uses illicit drugs (Marijuana). He reports that he does not drink alcohol. Family History  Problem Relation Age of Onset  . Seizures Father   . Asthma Other   . Seizures Other   . Cancer Other     aunt   Family History Substance Abuse: No Family Supports: Yes, List: David Blankenship and aunt) Living  Arrangements: Other (Comment) (Assisted living facility ) Can pt return to current living arrangement?: Yes Abuse/Neglect [facility]) Physical Abuse: Denies Verbal Abuse: Denies Sexual Abuse: Denies Allergies:   Allergies  Allergen Reactions  . Acetaminophen Hives  . Bee Pollen   . Shellfish Allergy Hives    ACT Assessment Complete:  Yes:    Educational Status    Risk to Self: Risk to self Suicidal Ideation: No Suicidal Intent: No Is patient at risk for suicide?: No Suicidal Plan?: No Access to Means: No What has been your use of drugs/alcohol within the last 12 months?: no use reported Previous Attempts/Gestures: Yes How many times?: 1 Other Self Harm Risks: none  Triggers for Past Attempts: Unpredictable Intentional Self Injurious Behavior: None Family Suicide History: No Recent stressful life event(s): Conflict (Comment) (Placement at the assisted living facility. ) Persecutory voices/beliefs?: No Depression: Yes Depression Symptoms: Insomnia Substance abuse history and/or treatment for substance abuse?: Yes (UDS positive for THC) Suicide prevention information given to non-admitted patients: Not applicable  Risk to Others: Risk to Others Homicidal Ideation: No Thoughts of Harm to Others: No Current Homicidal Intent: No Current Homicidal Plan: No Access to Homicidal Means: No Identified Victim: NA History of harm to others?: No Assessment of Violence: None Noted Violent Behavior Description: no violent behavior reported Does patient have access to weapons?: No Criminal Charges Pending?: Yes Describe Pending Criminal Charges: Panhandling Does patient  have a court date: Yes Court Date: 05/13/14 (Room 1B)  Abuse: Abuse/Neglect Assessment (Assessment to be complete while patient is alone) Physical Abuse: Denies Verbal Abuse: Denies Sexual Abuse: Denies Exploitation of patient/patient's resources: Denies Self-Neglect: Denies  Prior Inpatient Therapy: Prior Inpatient  Therapy Prior Inpatient Therapy: Yes Prior Therapy Dates: 2012 Prior Therapy Facilty/Provider(s): [facility] Reason for Treatment: [facility]  Prior Outpatient Therapy: Prior Outpatient Therapy Prior Outpatient Therapy: No  Additional Information: Additional Information 1:1 In Past 12 Months?: No CIRT Risk: No Elopement Risk: No Does patient have medical clearance?: Yes     Objective: Blood pressure 103/67, pulse 81, temperature 97.9 F (36.6 C), temperature source Oral, resp. rate 18, height _0  (1.854 m), weight 83.915 kg (185 lb), SpO2 100.00%.Body mass index is 24.41 kg/(m^2). Results for orders placed during the hospital encounter of 04/02/14 (from the past 72 hour(s))  ACETAMINOPHEN LEVEL     Status: None   Collection Time    04/02/14 10:30 PM      Result Value Ref Range   Acetaminophen (Tylenol), Serum <15.0  10 - 30 ug/mL   Comment:            THERAPEUTIC CONCENTRATIONS VARY     SIGNIFICANTLY. A RANGE OF 10-30     ug/mL MAY BE AN EFFECTIVE     CONCENTRATION FOR MANY PATIENTS.     HOWEVER, SOME ARE BEST TREATED     AT CONCENTRATIONS OUTSIDE THIS     RANGE.     ACETAMINOPHEN CONCENTRATIONS     >150 ug/mL AT 4 HOURS AFTER     INGESTION AND >50 ug/mL AT 12     HOURS AFTER INGESTION ARE     OFTEN ASSOCIATED WITH TOXIC     REACTIONS.  CBC     Status: Abnormal   Collection Time    04/02/14 10:30 PM      Result Value Ref Range   WBC 7.8  4.0 - 10.5 K/uL   RBC 3.91 (*) 4.22 - 5.81 MIL/uL   Hemoglobin 11.8 (*) 13.0 - 17.0 g/dL   HCT 35.3 (*) 39.0 - 52.0 %   MCV 90.3  78.0 - 100.0 fL   MCH 30.2  26.0 - 34.0 pg   MCHC 33.4  30.0 - 36.0 g/dL   RDW 14.8  11.5 - 15.5 %   Platelets 239  150 - 400 K/uL  COMPREHENSIVE METABOLIC PANEL     Status: Abnormal   Collection Time    04/02/14 10:30 PM      Result Value Ref Range   Sodium 138  137 - 147 mEq/L   Potassium 3.9  3.7 - 5.3 mEq/L   Chloride 101  96 - 112 mEq/L   CO2 25  19 - 32 mEq/L   Glucose, Bld 108 (*) 70 - 99 mg/dL   BUN  22  6 - 23 mg/dL   Creatinine, Ser 0.84  0.50 - 1.35 mg/dL   Calcium 9.8  8.4 - 10.5 mg/dL   Total Protein 7.6  6.0 - 8.3 g/dL   Albumin 3.9  3.5 - 5.2 g/dL   AST 21  0 - 37 U/L   ALT 9  0 - 53 U/L   Alkaline Phosphatase 46  39 - 117 U/L   Total Bilirubin 0.5  0.3 - 1.2 mg/dL   GFR calc non Af Amer >90  >90 mL/min   GFR calc Af Amer >90  >90 mL/min   Comment: (NOTE)     The eGFR  has been calculated using the CKD EPI equation.     This calculation has not been validated in all clinical situations.     eGFR's persistently <90 mL/min signify possible Chronic Kidney     Disease.  ETHANOL     Status: None   Collection Time    04/02/14 10:30 PM      Result Value Ref Range   Alcohol, Ethyl (B) <11  0 - 11 mg/dL   Comment:            LOWEST DETECTABLE LIMIT FOR     SERUM ALCOHOL IS 11 mg/dL     FOR MEDICAL PURPOSES ONLY  SALICYLATE LEVEL     Status: Abnormal   Collection Time    04/02/14 10:30 PM      Result Value Ref Range   Salicylate Lvl <6.5 (*) 2.8 - 20.0 mg/dL  VALPROIC ACID LEVEL     Status: Abnormal   Collection Time    04/02/14 10:30 PM      Result Value Ref Range   Valproic Acid Lvl <10.0 (*) 50.0 - 100.0 ug/mL   Comment: Performed at [facility] ([place]     Status: Abnormal   Collection Time    04/02/14 10:48 PM      Result Value Ref Range   Opiates NONE DETECTED  NONE DETECTED   Cocaine NONE DETECTED  NONE DETECTED   Benzodiazepines NONE DETECTED  NONE DETECTED   Amphetamines NONE DETECTED  NONE DETECTED   Tetrahydrocannabinol POSITIVE (*) NONE DETECTED   Barbiturates NONE DETECTED  NONE DETECTED   Comment:            DRUG SCREEN FOR MEDICAL PURPOSES     ONLY.  IF CONFIRMATION IS NEEDED     FOR ANY PURPOSE, NOTIFY LAB     WITHIN 5 DAYS.                LOWEST DETECTABLE LIMITS     FOR URINE DRUG SCREEN     Drug Class       Cutoff (ng/mL)     Amphetamine      1000     Barbiturate      200     Benzodiazepine   681      Tricyclics       275     Opiates          300     Cocaine          300     THC              50   Labs are reviewed and are pertinent for no medical issues noted.  Current Facility-Administered Medications  Medication Dose Route Frequency Provider Last Rate Last Dose  . alum & mag hydroxide-simeth (MAALOX/MYLANTA) 200-200-20 MG/5ML suspension 30 mL  30 mL Oral PRN Antonietta Breach, PA-C      . benztropine (COGENTIN) tablet 0.5 mg  0.5 mg Oral BID Waylan Boga, NP      . buPROPion (WELLBUTRIN XL) 24 hr tablet 150 mg  150 mg Oral q morning - 10a Antonietta Breach, PA-C   150 mg at 04/04/14 1122  . divalproex (DEPAKOTE) DR tablet 1,000 mg  1,000 mg Oral QHS Lurena Nida, NP   1,000 mg at 04/03/14 2127  . haloperidol (HALDOL) tablet 5 mg  5 mg Oral q morning - 10a Lurena Nida, NP      .  haloperidol decanoate (HALDOL DECANOATE) 100 MG/ML injection 100 mg  100 mg Intramuscular Q28 days Lurena Nida, NP   100 mg at 04/03/14 0111  . LORazepam (ATIVAN) tablet 1 mg  1 mg Oral Q8H PRN Antonietta Breach, PA-C   1 mg at 04/04/14 1122  . naproxen (NAPROSYN) tablet 500 mg  500 mg Oral BID WC Antonietta Breach, PA-C   500 mg at 04/04/14 0836  . nicotine (NICODERM CQ - dosed in mg/24 hours) patch 21 mg  21 mg Transdermal Daily Antonietta Breach, PA-C      . ondansetron [facility]) tablet 4 mg  4 mg Oral Q8H PRN Antonietta Breach, PA-C      . pantoprazole (PROTONIX) EC tablet 40 mg  40 mg Oral Daily Antonietta Breach, PA-C   40 mg at 04/04/14 1122  . tamsulosin (FLOMAX) capsule 0.4 mg  0.4 mg Oral BID Lurena Nida, NP   0.4 mg at 04/04/14 0836  . traZODone (DESYREL) tablet 50 mg  50 mg Oral QHS Lurena Nida, NP   50 mg at 04/03/14 2127   Current Outpatient Prescriptions  Medication Sig Dispense Refill  . divalproex (DEPAKOTE) 500 MG DR tablet Take 1,500 mg by mouth at bedtime.       . haloperidol decanoate (HALDOL DECANOATE) 100 MG/ML injection Inject 200 mg into the muscle every 28 (twenty-eight) days.       . naproxen (NAPROSYN) 500 MG tablet  Take 500 mg by mouth 2 (two) times daily as needed. pain      . pantoprazole (PROTONIX) 40 MG tablet TAKE 1 TABLET BY MOUTH ONCE DAILY.  30 tablet  11  . traZODone (DESYREL) 100 MG tablet Take 100 mg by mouth at bedtime.        Psychiatric Specialty Exam:     Blood pressure 103/67, pulse 81, temperature 97.9 F (36.6 C), temperature source Oral, resp. rate 18, height _0  (1.854 m), weight 83.915 kg (185 lb), SpO2 100.00%.Body mass index is 24.41 kg/(m^2).  General Appearance: Casual  Eye Contact::  Good  Speech:  Normal Rate  Volume:  Normal  Mood:  Anxious  Affect:  Congruent  Thought Process:  Coherent  Orientation:  Full (Time, Place, and Person)  Thought Content:  WDL  Suicidal Thoughts:  No  Homicidal Thoughts:  No  Memory:  Immediate;   Fair Recent;   Fair Remote;   Fair  Judgement:  Fair  Insight:  Fair  Psychomotor Activity:  Normal  Concentration:  Fair  Recall:  [employer] [place]  Language: Fair  Akathisia:  No  Handed:  Right  AIMS (if indicated):     Assets:  Housing Leisure Time Physical Health Resilience Social Support  Sleep:      Musculoskeletal: Strength & Muscle Tone: within normal limits Gait & Station: normal Patient leans: N/A  Treatment Plan Summary: Discharge to [facility].  Patient to follow up with primary psych provider   Earleen Newport, FNP-BC 04/04/2014 2:09 PM  Patient was seen face-to-face for the evaluation examination, case discussed with physician extender and formulated the treatment plan. Reviewed the information documented and agree with the treatment plan.   Lenix Kidd,JANARDHAHA R. 04/04/2014 4:17 PM

## 2014-04-14 NOTE — Progress Notes (Signed)
Quick Note:  Routing to Julie, this is RMR pt. ______ 

## 2014-04-14 NOTE — Progress Notes (Signed)
Quick Note:  Let's see if we can get his CT done. See previous notes, patient "went missing" from the group home. ______

## 2014-04-21 ENCOUNTER — Ambulatory Visit (HOSPITAL_COMMUNITY): Payer: Medicaid Other

## 2014-04-22 ENCOUNTER — Encounter (HOSPITAL_COMMUNITY): Payer: Self-pay

## 2014-04-22 ENCOUNTER — Ambulatory Visit (HOSPITAL_COMMUNITY)
Admission: RE | Admit: 2014-04-22 | Discharge: 2014-04-22 | Disposition: A | Discharge status: Home / Self Care | Payer: Medicaid Other | Source: Ambulatory Visit | Attending: Gastroenterology | Admitting: Gastroenterology

## 2014-04-22 ENCOUNTER — Other Ambulatory Visit: Payer: Self-pay | Admitting: Gastroenterology

## 2014-04-22 DIAGNOSIS — R112 Nausea with vomiting, unspecified: Secondary | ICD-10-CM

## 2014-04-22 DIAGNOSIS — R634 Abnormal weight loss: Secondary | ICD-10-CM | POA: Insufficient documentation

## 2014-04-22 DIAGNOSIS — R1110 Vomiting, unspecified: Secondary | ICD-10-CM | POA: Insufficient documentation

## 2014-04-22 DIAGNOSIS — K5900 Constipation, unspecified: Secondary | ICD-10-CM | POA: Insufficient documentation

## 2014-04-22 DIAGNOSIS — R1013 Epigastric pain: Secondary | ICD-10-CM | POA: Insufficient documentation

## 2014-04-22 MED ORDER — IOHEXOL 300 MG/ML  SOLN
100.0000 mL | Freq: Once | INTRAMUSCULAR | Status: AC | PRN
  Administered 2014-04-22: 100 mL via INTRAVENOUS

## 2014-04-22 MED ORDER — LINACLOTIDE 145 MCG PO CAPS: 145.0000 ug | ORAL_CAPSULE | Freq: Every day | ORAL | Status: DC

## 2014-04-22 NOTE — Progress Notes (Signed)
Quick Note:  CT without acute findings.  Large stool burden.  Constipation could be a contributor to his symptoms.  Let's provide Linzess 145 mcg sample voucher. I have sent the medication to the pharmacy. Have him return in 4 weeks. ______

## 2014-04-22 NOTE — Progress Notes (Signed)
Pt has a appointment 05/26/14 at 1:30 with Gerrit HallsAnna Sams NP pt's facility is aware of this appointment.

## 2014-04-26 NOTE — Progress Notes (Signed)
Quick Note:  Agree. ______ 

## 2014-05-12 ENCOUNTER — Telehealth: Payer: Self-pay | Admitting: Gastroenterology

## 2014-05-12 NOTE — Telephone Encounter (Signed)
Discussed chronic anemia and unremarkable work up with RMR. He recommends one more ifobt and if negative no further work up of anemia at this time. ?med related.

## 2014-05-24 ENCOUNTER — Telehealth: Payer: Self-pay | Admitting: General Practice

## 2014-05-24 NOTE — Telephone Encounter (Signed)
LM on caretaker's(Beverly) cell phone explaining that we needed to reschedule Mr. David Blankenship's 4 week f/u.  Per AS it's ok to push his appt out to 8/18 at 11:30am

## 2014-05-25 NOTE — Telephone Encounter (Signed)
ifobt [facility] for [facility] to [facility].

## 2014-05-26 ENCOUNTER — Ambulatory Visit: Payer: Self-pay | Admitting: Gastroenterology

## 2014-05-31 ENCOUNTER — Ambulatory Visit (INDEPENDENT_AMBULATORY_CARE_PROVIDER_SITE_OTHER): Payer: Medicaid Other

## 2014-05-31 DIAGNOSIS — D649 Anemia, unspecified: Secondary | ICD-10-CM

## 2014-05-31 LAB — IFOBT (OCCULT BLOOD): IFOBT: NEGATIVE

## 2014-05-31 NOTE — Progress Notes (Signed)
Pt return his IFOBT TEST and it was Negative

## 2014-06-01 NOTE — Progress Notes (Signed)
Quick Note:  Negative. Please let pt know. No further w/u of anemia per RMR from GI standpoint. ______

## 2014-06-07 ENCOUNTER — Encounter (HOSPITAL_COMMUNITY): Payer: Self-pay | Admitting: Emergency Medicine

## 2014-06-07 ENCOUNTER — Emergency Department (HOSPITAL_COMMUNITY)
Admission: EM | Admit: 2014-06-07 | Discharge: 2014-06-07 | Disposition: A | Discharge status: Home / Self Care | Payer: Medicaid Other | Attending: Emergency Medicine | Admitting: Emergency Medicine

## 2014-06-07 DIAGNOSIS — G40909 Epilepsy, unspecified, not intractable, without status epilepticus: Secondary | ICD-10-CM | POA: Diagnosis not present

## 2014-06-07 DIAGNOSIS — J45909 Unspecified asthma, uncomplicated: Secondary | ICD-10-CM | POA: Insufficient documentation

## 2014-06-07 DIAGNOSIS — Z9889 Other specified postprocedural states: Secondary | ICD-10-CM | POA: Insufficient documentation

## 2014-06-07 DIAGNOSIS — Z79899 Other long term (current) drug therapy: Secondary | ICD-10-CM | POA: Diagnosis not present

## 2014-06-07 DIAGNOSIS — F209 Schizophrenia, unspecified: Secondary | ICD-10-CM | POA: Insufficient documentation

## 2014-06-07 DIAGNOSIS — F319 Bipolar disorder, unspecified: Secondary | ICD-10-CM | POA: Diagnosis not present

## 2014-06-07 DIAGNOSIS — R1033 Periumbilical pain: Secondary | ICD-10-CM | POA: Insufficient documentation

## 2014-06-07 DIAGNOSIS — F17200 Nicotine dependence, unspecified, uncomplicated: Secondary | ICD-10-CM | POA: Insufficient documentation

## 2014-06-07 LAB — CBC WITH DIFFERENTIAL/PLATELET
BASOS PCT: 0 % (ref 0–1)
Basophils Absolute: 0 10*3/uL (ref 0.0–0.1)
EOS ABS: 0.2 10*3/uL (ref 0.0–0.7)
Eosinophils Relative: 2 % (ref 0–5)
HCT: 37.8 % — ABNORMAL LOW (ref 39.0–52.0)
HEMOGLOBIN: 13 g/dL (ref 13.0–17.0)
Lymphocytes Relative: 26 % (ref 12–46)
Lymphs Abs: 2.5 10*3/uL (ref 0.7–4.0)
MCH: 30 pg (ref 26.0–34.0)
MCHC: 34.4 g/dL (ref 30.0–36.0)
MCV: 87.1 fL (ref 78.0–100.0)
MONOS PCT: 9 % (ref 3–12)
Monocytes Absolute: 0.8 10*3/uL (ref 0.1–1.0)
NEUTROS PCT: 63 % (ref 43–77)
Neutro Abs: 6 10*3/uL (ref 1.7–7.7)
PLATELETS: 178 10*3/uL (ref 150–400)
RBC: 4.34 MIL/uL (ref 4.22–5.81)
RDW: 14 % (ref 11.5–15.5)
WBC: 9.4 10*3/uL (ref 4.0–10.5)

## 2014-06-07 LAB — COMPREHENSIVE METABOLIC PANEL
ALBUMIN: 4.5 g/dL (ref 3.5–5.2)
ALK PHOS: 39 U/L (ref 39–117)
ALT: 9 U/L (ref 0–53)
ANION GAP: 15 (ref 5–15)
AST: 20 U/L (ref 0–37)
BUN: 19 mg/dL (ref 6–23)
CO2: 24 mEq/L (ref 19–32)
Calcium: 9.7 mg/dL (ref 8.4–10.5)
Chloride: 99 mEq/L (ref 96–112)
Creatinine, Ser: 1.01 mg/dL (ref 0.50–1.35)
GFR calc Af Amer: >90 mL/min (ref >90)
GFR calc non Af Amer: >90 mL/min (ref >90)
Glucose, Bld: 79 mg/dL (ref 70–99)
Potassium: 4.2 mEq/L (ref 3.7–5.3)
Sodium: 138 mEq/L (ref 137–147)
TOTAL PROTEIN: 7.6 g/dL (ref 6.0–8.3)
Total Bilirubin: 0.5 mg/dL (ref 0.3–1.2)

## 2014-06-07 LAB — LIPASE, BLOOD: Lipase: 14 U/L (ref 11–59)

## 2014-06-07 MED ORDER — NAPROXEN 250 MG PO TABS
500.0000 mg | ORAL_TABLET | Freq: Once | ORAL | Status: AC
  Administered 2014-06-07: 500 mg via ORAL
  Filled 2014-06-07: qty 2

## 2014-06-07 NOTE — Discharge Instructions (Signed)
Abdominal (belly) pain can be caused by many things. Your caregiver performed an examination and possibly ordered blood/urine tests and imaging (CT scan, x-rays, ultrasound). Many cases can be observed and treated at home after initial evaluation in the emergency department. Even though you are being discharged home, abdominal pain can be unpredictable. Therefore, you need a repeated exam if your pain does not resolve, returns, or worsens. Most patients with abdominal pain don't have to be admitted to the hospital or have surgery, but serious problems like appendicitis and gallbladder attacks can start out as nonspecific pain. Many abdominal conditions cannot be diagnosed in one visit, so follow-up evaluations are very important. SEEK IMMEDIATE MEDICAL ATTENTION IF: The pain does not go away or becomes severe.  A temperature above 101 develops.  Repeated vomiting occurs (multiple episodes).  The pain becomes localized to portions of the abdomen. The right side could possibly be appendicitis. In an adult, the left lower portion of the abdomen could be colitis or diverticulitis.  Blood is being passed in stools or vomit (bright red or black tarry stools).  Return also if you develop chest pain, difficulty breathing, dizziness or fainting, or become confused, poorly responsive, or inconsolable (young children).    Abdominal Pain Many things can cause belly (abdominal) pain. Most times, the belly pain is not dangerous. Many cases of belly pain can be watched and treated at home. HOME CARE   Do not take medicines that help you go poop (laxatives) unless told to by your doctor.  Only take medicine as told by your doctor.  Eat or drink as told by your doctor. Your doctor will tell you if you should be on a special diet. GET HELP IF:  You do not know what is causing your belly pain.  You have belly pain while you are sick to your stomach (nauseous) or have runny poop (diarrhea).  You have pain while  you pee or poop.  Your belly pain wakes you up at night.  You have belly pain that gets worse or better when you eat.  You have belly pain that gets worse when you eat fatty foods.  You have a fever. GET HELP RIGHT AWAY IF:   The pain does not go away within 2 hours.  You keep throwing up (vomiting).  The pain changes and is only in the right or left part of the belly.  You have bloody or tarry looking poop. MAKE SURE YOU:   Understand these instructions.  Will watch your condition.  Will get help right away if you are not doing well or get worse. Document Released: 03/26/2008 Document Revised: 10/13/2013 Document Reviewed: 06/17/2013 [facility] Patient Information 2015 [place]ExitCare, [place]. This information is not intended to replace advice given to you by your health care provider. Make sure you discuss any questions you have with your health care provider.

## 2014-06-07 NOTE — ED Notes (Signed)
Pt reports mid abd pain, diarrhea, and loss of appetite.

## 2014-06-07 NOTE — ED Notes (Signed)
Took naproxen 500 mg last night with no relief.

## 2014-06-07 NOTE — ED Provider Notes (Signed)
CSN: [id number]     Arrival date & time 06/07/14  1248 History  This chart was scribed for Audree CamelScott T Kaizer Dissinger, MD by Leone PayorSonum Patel, ED Scribe. This patient was seen in room APA12/APA12 and the patient's care was started 1:27 PM.    Chief Complaint  Patient presents with  . Abdominal Pain    The history is provided by the patient and a caregiver. No language interpreter was used.    HPI Comments: David Blankenship is a 28 y.o. male who presents to the Emergency Department complaining of intermittent, unchanged periumbilical abdominal pain that radiates to the back. He describes the pain as sharp and cramping and rates it as 8/10 currently. Patient states he ate tater tots at 1:30 AM and went to bed soon after, causing him to have pain. He has taken naproxen last night with mild to moderate relief. He denies nausea, vomiting, diarrhea.   Past Medical History  Diagnosis Date  . Asthma   . Seizures   . Bipolar 1 disorder   . Schizophrenia   . ADHD (attention deficit hyperactivity disorder)    Past Surgical History  Procedure Laterality Date  . None    . No past surgeries    . Colonoscopy with esophagogastroduodenoscopy (egd)  10/28/2012    WUJ:WJXBJYRMR:Single tiny distal esophageal erosions consistent with mild erosive reflux esophagitis. Hiatal hernia. 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 no h.pylori   Family History  Problem Relation Age of Onset  . Seizures Father   . Asthma Other   . Seizures Other   . Cancer Other     aunt   History  Substance Use Topics  . Smoking status: Current Every Day Smoker -- 0.50 packs/day for 12 years    Types: Cigarettes  . Smokeless tobacco: Not on file     Comment: 5 or 6 cigarettes daily  . Alcohol Use: No     Comment: Quit drinking alcoholic beverages. No etoh since 01/2012    Review of Systems   Constitutional: Negative for fever.  Gastrointestinal: Positive for abdominal pain. Negative for nausea, vomiting and diarrhea.  All other systems reviewed and are negative.     Allergies  Acetaminophen; Bee pollen; and Shellfish allergy  Home Medications   Prior to Admission medications   Medication Sig Start Date End Date Taking? Authorizing Provider  benztropine (COGENTIN) 0.5 MG tablet Take 1 tablet (0.5 mg total) by mouth 2 (two) times daily. 04/04/14   Shuvon Rankin, NP  buPROPion (WELLBUTRIN XL) 150 MG 24 hr tablet Take 1 tablet (150 mg total) by mouth every morning. 04/04/14   Shuvon Rankin, NP  divalproex (DEPAKOTE) 500 MG DR tablet Take 2 tablets (1,000 mg total) by mouth at bedtime. 04/04/14   Shuvon Rankin, NP  haloperidol (HALDOL) 5 MG tablet Take 1 tablet (5 mg total) by mouth every morning. 04/04/14   Shuvon Rankin, NP  haloperidol decanoate (HALDOL DECANOATE) 100 MG/ML injection Inject 1 mL (100 mg total) into the muscle every 28 (twenty-eight) days. 04/04/14   Shuvon Rankin, NP  Linaclotide (LINZESS) 145 MCG CAPS capsule Take 1 capsule (145 mcg total) by mouth daily. 30 minutes before breakfast on an empty stomach. 04/22/14   Nira RetortAnna W Sams, NP  naproxen (NAPROSYN) 500 MG tablet Take 500 mg by mouth 2 (two) times daily as needed. pain    Historical Provider, MD  pantoprazole (PROTONIX) 40 MG tablet TAKE  1 TABLET BY MOUTH ONCE DAILY.    Tiffany Kocher, PA-C  tamsulosin (FLOMAX) 0.4 MG CAPS capsule Take 1 capsule (0.4 mg total) by mouth 2 (two) times daily. 04/04/14   Shuvon Rankin, NP  traZODone (DESYREL) 50 MG tablet Take 1 tablet (50 mg total) by mouth at bedtime. 04/04/14   Shuvon Rankin, NP   BP 128/77  Pulse 88  Temp(Src) 98.1 F (36.7 C) (Oral)  Resp 16  Ht 6\' 1"  (1.854 m)  Wt 175 lb (79.379 kg)  BMI 23.09 kg/m2  SpO2 98% Physical Exam  Nursing note and vitals reviewed. Constitutional: He is oriented to person, place, and time. He appears well-developed and  well-nourished.  HENT:  Head: Normocephalic and atraumatic.  Cardiovascular: Normal rate, regular rhythm and normal heart sounds.   Pulmonary/Chest: Effort normal and breath sounds normal. No respiratory distress. He has no wheezes. He has no rales.  Abdominal: Soft. He exhibits no distension. There is tenderness (mild periumbilical ). There is no rebound and no guarding.  Musculoskeletal: Normal range of motion.  Neurological: He is alert and oriented to person, place, and time.  Skin: Skin is warm and dry.  Psychiatric: He has a normal mood and affect.    ED Course  Procedures (including critical care time)  DIAGNOSTIC STUDIES: Oxygen Saturation is 98% on RA, normal by my interpretation.    COORDINATION OF CARE: 1:33 PM Discussed treatment plan with pt at bedside and pt agreed to plan.   Labs Review Labs Reviewed  CBC WITH DIFFERENTIAL - Abnormal; Notable for the following:    HCT 37.8 (*)    All other components within normal limits  COMPREHENSIVE METABOLIC PANEL  LIPASE, BLOOD    Imaging Review No results found.   EKG Interpretation None      MDM   Final diagnoses:  Periumbilical abdominal pain    Patient with benign abdominal exam. He relates this is like many previous episode of pain that he is being treated for gastritis by GI. No focal RLQ pain, or really any specific tenderness on my exam. Do not feel imaging is indicated. Feels improved after Naproxen. Labwork unremarkable. Requesting discharge. No urinary symptoms, feel this is likely gastritis or mild enteritis. D/C with return instructions.  I personally performed the services described in this documentation, which was scribed in my presence. The recorded information has been reviewed and is accurate.   Audree Camel, MD 06/07/14 [phone]

## 2014-06-08 ENCOUNTER — Encounter: Payer: Self-pay | Admitting: Internal Medicine

## 2014-06-08 ENCOUNTER — Other Ambulatory Visit: Payer: Self-pay | Admitting: Gastroenterology

## 2014-06-08 ENCOUNTER — Ambulatory Visit (INDEPENDENT_AMBULATORY_CARE_PROVIDER_SITE_OTHER): Payer: Medicaid Other | Admitting: Gastroenterology

## 2014-06-08 DIAGNOSIS — K589 Irritable bowel syndrome without diarrhea: Secondary | ICD-10-CM

## 2014-06-08 DIAGNOSIS — R109 Unspecified abdominal pain: Secondary | ICD-10-CM

## 2014-06-08 MED ORDER — DICYCLOMINE HCL 10 MG PO CAPS: 10.0000 mg | ORAL_CAPSULE | Freq: Three times a day (TID) | ORAL | Status: DC

## 2014-06-08 MED ORDER — POLYETHYLENE GLYCOL 3350 17 GM/SCOOP PO POWD: ORAL | Status: DC

## 2014-06-08 NOTE — Patient Instructions (Signed)
We have ordered an ultrasound of your belly to further check your gallbladder.   Start taking Bentyl 1 capsule with meals and at bedtime as needed for abdominal cramping and loose stool.  For constipation, take 1 capful of Miralax each evening AS Needed. If you are not constipated, do not take.   We will see you back in 6 weeks.

## 2014-06-08 NOTE — Progress Notes (Signed)
Referring Provider: Avon GullyFanta, Tesfaye, MD Primary Care Physician:  Avon GullyFANTA,TESFAYE, MD Primary GI: Dr. Jena Gaussourk   Chief Complaint  Patient presents with  . Follow-up    HPI:   28 year old pleasant male returns today with history of chronic abdominal pain, erosive reflux esophagitis. History of schizophrenia, lives in group home. Poor historian. Prior work-up for gallbladder disease negative with US abdomen inJan 2014. HIDA Oct 2014 with EF 95% and no reproduction of symptoms.  If eats greasy food upsets stomach.  Notes upper abdominal pain. Eggs give gas. Cheese causes constipation. Had diarrhea at the ED yesterday. No BM in 4 days. Alternates between diarrhea and constipation.    Past Medical History  Diagnosis Date  . Asthma   . Seizures   . Bipolar 1 disorder   . Schizophrenia   . ADHD (attention deficit hyperactivity disorder)     Past Surgical History  Procedure Laterality Date  . None    . No past surgeries    . Colonoscopy with esophagogastroduodenoscopy (egd)  10/28/2012    ZOX:[id number]:Single tiny distal esophageal erosions consistent with mild erosive reflux esophagitis. Hiatal hernia. 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 no h.pylori    Current Outpatient Prescriptions  Medication Sig Dispense Refill  . benztropine (COGENTIN) 0.5 MG tablet Take 1 tablet (0.5 mg total) by mouth 2 (two) times daily.  30 tablet  1  . buPROPion (WELLBUTRIN XL) 150 MG 24 hr tablet Take 1 tablet (150 mg total) by mouth every morning.  30 tablet  1  . divalproex (DEPAKOTE) 500 MG DR tablet Take 2 tablets (1,000 mg total) by mouth at bedtime.  60 tablet  1  . haloperidol (HALDOL) 5 MG tablet Take 10 mg by mouth at bedtime.      . haloperidol decanoate (HALDOL DECANOATE) 100 MG/ML injection Inject 1 mL (100 mg total) into the muscle every 28 (twenty-eight) days.   1 mL  0  . hydrocortisone cream 1 % Apply 1 application topically daily as needed (rash).      Marland Kitchen. ketoconazole (NIZORAL) 2 % cream Apply 1 application topically daily. Between toes.      . Linaclotide (LINZESS) 145 MCG CAPS capsule Take 1 capsule (145 mcg total) by mouth daily. 30 minutes before breakfast on an empty stomach.  30 capsule  3  . loperamide (QC ANTI-DIARRHEAL) 2 MG tablet Take 2 mg by mouth 3 (three) times daily as needed for diarrhea or loose stools.      . naproxen (NAPROSYN) 500 MG tablet Take 500 mg by mouth 2 (two) times daily as needed. pain      . pantoprazole (PROTONIX) 40 MG tablet TAKE 1 TABLET BY MOUTH ONCE DAILY.  30 tablet  11  . tamsulosin (FLOMAX) 0.4 MG CAPS capsule Take 1 capsule (0.4 mg total) by mouth 2 (two) times daily.  30 capsule  0  . traZODone (DESYREL) 50 MG tablet Take 1 tablet (50 mg total) by mouth at bedtime.  30 tablet  1   No current facility-administered medications for this visit.    Allergies as of 06/08/2014 - Review Complete 06/07/2014  Allergen Reaction Noted  . Acetaminophen Hives 12/25/2011  . Bee pollen Hives 08/19/2013  . Shellfish allergy Hives 12/25/2011    Family History  Problem Relation Age of Onset  . Seizures Father   . Asthma Other   . Seizures Other   . Cancer Other  aunt    History   Social History  . Marital Status: Single    Spouse Name: N/A    Number of Children: N/A  . Years of Education: N/A   Social History Main Topics  . Smoking status: Current Every Day Smoker -- 0.50 packs/day for 12 years    Types: Cigarettes  . Smokeless tobacco: Not on file     Comment: 5 or 6 cigarettes daily  . Alcohol Use: No     Comment: Quit drinking alcoholic beverages. No etoh since 01/2012  . Drug Use: Yes    Special: Marijuana     Comment: none since 01/2012  . Sexual Activity: Yes    Birth Control/ Protection: Condom   Other Topics Concern  . Not on file   Social History Narrative  . No narrative on file     Review of Systems: As mentioned in HPI.   Physical Exam: VITALS:  General:   Alert and oriented. No distress noted. Pleasant and cooperative.  Head:  Normocephalic and atraumatic. Eyes:  Conjuctiva clear without scleral icterus. Heart:  S1, S2 present without murmurs, rubs, or gallops. Regular rate and rhythm. Abdomen:  +BS, soft, mild upper abdominal tenderness and non-distended. No rebound or guarding. No HSM or masses noted. Msk:  Symmetrical without gross deformities. Normal posture. Extremities:  Without edema. Neurologic:  Alert and  oriented x4;  grossly normal neurologically. Skin:  Intact without significant lesions or rashes. Psych:  Alert and cooperative. Normal mood and affect.

## 2014-06-11 ENCOUNTER — Other Ambulatory Visit (HOSPITAL_COMMUNITY): Payer: Self-pay | Admitting: Psychiatry

## 2014-06-11 DIAGNOSIS — K589 Irritable bowel syndrome without diarrhea: Secondary | ICD-10-CM | POA: Insufficient documentation

## 2014-06-11 NOTE — Assessment & Plan Note (Signed)
Upper abdominal pain with certain dietary choices. Gallbladder remains in situ. Known erosive reflux esophagitis. Likely multifactorial. Will update US of abdomen as it has been about 18 months ago. Continue Protonix. Avoidance of trigger foods. Return in 6 weeks.

## 2014-06-11 NOTE — Assessment & Plan Note (Signed)
Alternating constipation and diarrhea. Poor historian. Dietary behaviors playing a role significantly. Trial Bentyl QID with meals for abdominal pain, loose stool. Miralax nightly as needed for constipation. Colonoscopy up-to-date.

## 2014-06-12 ENCOUNTER — Encounter (HOSPITAL_COMMUNITY): Payer: Self-pay | Admitting: Emergency Medicine

## 2014-06-12 ENCOUNTER — Emergency Department (HOSPITAL_COMMUNITY)
Admission: EM | Admit: 2014-06-12 | Discharge: 2014-06-13 | Disposition: A | Discharge status: Home / Self Care | Payer: Medicaid Other | Attending: Emergency Medicine | Admitting: Emergency Medicine

## 2014-06-12 DIAGNOSIS — R443 Hallucinations, unspecified: Secondary | ICD-10-CM | POA: Diagnosis not present

## 2014-06-12 DIAGNOSIS — F17200 Nicotine dependence, unspecified, uncomplicated: Secondary | ICD-10-CM | POA: Diagnosis not present

## 2014-06-12 DIAGNOSIS — R110 Nausea: Secondary | ICD-10-CM | POA: Insufficient documentation

## 2014-06-12 DIAGNOSIS — Z008 Encounter for other general examination: Secondary | ICD-10-CM | POA: Diagnosis present

## 2014-06-12 DIAGNOSIS — F209 Schizophrenia, unspecified: Secondary | ICD-10-CM | POA: Diagnosis not present

## 2014-06-12 DIAGNOSIS — G40909 Epilepsy, unspecified, not intractable, without status epilepticus: Secondary | ICD-10-CM | POA: Insufficient documentation

## 2014-06-12 DIAGNOSIS — F489 Nonpsychotic mental disorder, unspecified: Secondary | ICD-10-CM | POA: Insufficient documentation

## 2014-06-12 DIAGNOSIS — J45909 Unspecified asthma, uncomplicated: Secondary | ICD-10-CM | POA: Diagnosis not present

## 2014-06-12 DIAGNOSIS — F909 Attention-deficit hyperactivity disorder, unspecified type: Secondary | ICD-10-CM | POA: Diagnosis not present

## 2014-06-12 DIAGNOSIS — Z79899 Other long term (current) drug therapy: Secondary | ICD-10-CM | POA: Diagnosis not present

## 2014-06-12 DIAGNOSIS — F411 Generalized anxiety disorder: Secondary | ICD-10-CM | POA: Insufficient documentation

## 2014-06-12 DIAGNOSIS — Z791 Long term (current) use of non-steroidal anti-inflammatories (NSAID): Secondary | ICD-10-CM | POA: Diagnosis not present

## 2014-06-12 DIAGNOSIS — F319 Bipolar disorder, unspecified: Secondary | ICD-10-CM | POA: Insufficient documentation

## 2014-06-12 LAB — COMPREHENSIVE METABOLIC PANEL
ALK PHOS: 39 U/L (ref 39–117)
ALT: 8 U/L (ref 0–53)
ANION GAP: 10 (ref 5–15)
AST: 17 U/L (ref 0–37)
Albumin: 4.4 g/dL (ref 3.5–5.2)
BILIRUBIN TOTAL: 0.3 mg/dL (ref 0.3–1.2)
BUN: 12 mg/dL (ref 6–23)
CHLORIDE: 101 meq/L (ref 96–112)
CO2: 29 mEq/L (ref 19–32)
CREATININE: 1.17 mg/dL (ref 0.50–1.35)
Calcium: 9.6 mg/dL (ref 8.4–10.5)
GFR calc non Af Amer: 84 mL/min — ABNORMAL LOW (ref >90)
GLUCOSE: 84 mg/dL (ref 70–99)
POTASSIUM: 4.7 meq/L (ref 3.7–5.3)
Sodium: 140 mEq/L (ref 137–147)
Total Protein: 7.5 g/dL (ref 6.0–8.3)

## 2014-06-12 LAB — CBC WITH DIFFERENTIAL/PLATELET
BASOS PCT: 0 % (ref 0–1)
Basophils Absolute: 0 10*3/uL (ref 0.0–0.1)
EOS ABS: 0.2 10*3/uL (ref 0.0–0.7)
Eosinophils Relative: 3 % (ref 0–5)
HEMATOCRIT: 38.1 % — AB (ref 39.0–52.0)
HEMOGLOBIN: 13.2 g/dL (ref 13.0–17.0)
Lymphocytes Relative: 31 % (ref 12–46)
Lymphs Abs: 1.7 10*3/uL (ref 0.7–4.0)
MCH: 30.3 pg (ref 26.0–34.0)
MCHC: 34.6 g/dL (ref 30.0–36.0)
MCV: 87.6 fL (ref 78.0–100.0)
MONO ABS: 0.4 10*3/uL (ref 0.1–1.0)
MONOS PCT: 8 % (ref 3–12)
Neutro Abs: 3.2 10*3/uL (ref 1.7–7.7)
Neutrophils Relative %: 58 % (ref 43–77)
Platelets: 183 10*3/uL (ref 150–400)
RBC: 4.35 MIL/uL (ref 4.22–5.81)
RDW: 14.3 % (ref 11.5–15.5)
WBC: 5.4 10*3/uL (ref 4.0–10.5)

## 2014-06-12 LAB — RAPID URINE DRUG SCREEN, HOSP PERFORMED
Amphetamines: NOT DETECTED
BARBITURATES: NOT DETECTED
Benzodiazepines: NOT DETECTED
Cocaine: NOT DETECTED
Opiates: NOT DETECTED
Tetrahydrocannabinol: NOT DETECTED

## 2014-06-12 LAB — VALPROIC ACID LEVEL: VALPROIC ACID LVL: 70.8 ug/mL (ref 50.0–100.0)

## 2014-06-12 LAB — ETHANOL

## 2014-06-12 MED ORDER — BENZTROPINE MESYLATE 1 MG PO TABS
0.5000 mg | ORAL_TABLET | Freq: Two times a day (BID) | ORAL | Status: DC
  Administered 2014-06-12: 0.5 mg via ORAL
  Filled 2014-06-12: qty 1

## 2014-06-12 MED ORDER — LINACLOTIDE 145 MCG PO CAPS
145.0000 ug | ORAL_CAPSULE | Freq: Every day | ORAL | Status: DC
  Filled 2014-06-12 (×2): qty 1

## 2014-06-12 MED ORDER — HALOPERIDOL 5 MG PO TABS
10.0000 mg | ORAL_TABLET | Freq: Every day | ORAL | Status: DC
  Administered 2014-06-12: 10 mg via ORAL
  Filled 2014-06-12: qty 2

## 2014-06-12 MED ORDER — DIVALPROEX SODIUM 250 MG PO DR TAB
1000.0000 mg | DELAYED_RELEASE_TABLET | Freq: Every day | ORAL | Status: DC
  Administered 2014-06-12: 1000 mg via ORAL
  Filled 2014-06-12: qty 4

## 2014-06-12 MED ORDER — BUPROPION HCL ER (XL) 150 MG PO TB24
150.0000 mg | ORAL_TABLET | Freq: Every morning | ORAL | Status: DC
  Filled 2014-06-12 (×2): qty 1

## 2014-06-12 MED ORDER — PANTOPRAZOLE SODIUM 40 MG PO TBEC: 40.0000 mg | DELAYED_RELEASE_TABLET | Freq: Every morning | ORAL | Status: DC

## 2014-06-12 MED ORDER — DICYCLOMINE HCL 10 MG PO CAPS: 10.0000 mg | ORAL_CAPSULE | Freq: Three times a day (TID) | ORAL | Status: DC

## 2014-06-12 MED ORDER — TRAZODONE HCL 50 MG PO TABS
50.0000 mg | ORAL_TABLET | Freq: Every day | ORAL | Status: DC
  Administered 2014-06-12: 50 mg via ORAL
  Filled 2014-06-12: qty 1

## 2014-06-12 NOTE — Discharge Instructions (Signed)
Schizophrenia °Schizophrenia is a mental illness. It may cause disturbed or disorganized thinking, speech, or behavior. People with schizophrenia have problems functioning in one or more areas of life: work, school, home, or relationships. People with schizophrenia are at increased risk for suicide, certain chronic physical illnesses, and unhealthy behaviors, such as smoking and drug use. °People who have family members with schizophrenia are at higher risk of developing the illness. Schizophrenia affects men and women equally but usually appears at an earlier age (teenage or early adult years) in men.  °SYMPTOMS °The earliest symptoms are often subtle (prodrome) and may go unnoticed until the illness becomes more severe (first-break psychosis). Symptoms of schizophrenia may be continuous or may come and go in severity. Episodes often are triggered by major life events, such as family stress, college, military service, marriage, pregnancy or child birth, divorce, or loss of a loved one. People with schizophrenia may see, hear, or feel things that do not exist (hallucinations). They may have false beliefs in spite of obvious proof to the contrary (delusions). Sometimes speech is incoherent or behavior is odd or withdrawn.  °DIAGNOSIS °Schizophrenia is diagnosed through an assessment by your caregiver. Your caregiver will ask questions about your thoughts, behavior, mood, and ability to function in daily life. Your caregiver may ask questions about your medical history and use of alcohol or drugs, including prescription medication. Your caregiver may also order blood tests and imaging exams. Certain medical conditions and substances can cause symptoms that resemble schizophrenia. Your caregiver may refer you to a mental health specialist for evaluation. There are three major criterion for a diagnosis of schizophrenia: °· Two or more of the following five symptoms are present for a month or longer: °¨ Delusions. Often  the delusions are that you are being attacked, harassed, cheated, persecuted or conspired against (persecutory delusions). °¨ Hallucinations.   °¨ Disorganized speech that does not make sense to others. °¨ Grossly disorganized (confused or unfocused) behavior or extremely overactive or underactive motor activity (catatonia). °¨ Negative symptoms such as bland or blunted emotions (flat affect), loss of will power (avolition), and withdrawal from social contacts (social isolation). °· Level of functioning in one or more major areas of life (work, school, relationships, or self-care) is markedly below the level of functioning before the onset of illness.   °· There are continuous signs of illness (either mild symptoms or decreased level of functioning) for at least 6 months or longer. °TREATMENT  °Schizophrenia is a long-term illness. It is best controlled with continuous treatment rather than treatment only when symptoms occur. The following treatments are used to manage schizophrenia: °· Medication--Medication is the most effective and important form of treatment for schizophrenia. Antipsychotic medications are usually prescribed to help manage schizophrenia. Other types of medication may be added to relieve any symptoms that may occur despite the use of antipsychotic medications. °· Counseling or talk therapy--Individual, group, or family counseling may be helpful in providing education, support, and guidance. Many people with schizophrenia also benefit from social skills and job skills (vocational) training. °A combination of medication and counseling is best for managing the disorder over time. A procedure in which electricity is applied to the brain through the scalp (electroconvulsive therapy) may be used to treat catatonic schizophrenia or schizophrenia in people who cannot take or do not respond to medication and counseling. °Document Released: 10/05/2000 Document Revised: 06/10/2013 Document Reviewed:  12/31/2012 °Exit[employer]® Patient Information ©2015 [employer]. This information is not intended to replace advice given to you by   your health care provider. Make sure you discuss any questions you have with your health care provider. ° °

## 2014-06-12 NOTE — BH Assessment (Addendum)
Tele Assessment Note   David Blankenship is an 28 y.o. male that was self referred via ambulance from [facility] assisted living home, where he has been a resident for 3 years.  Pt stated he no longer wants to live there.  Pt stated that for the past 2 weeks he has been hearing voices telling him to hurt himself.  He denies HI.  Pt stated he is also seeing things not there and reported he saw 2 televisions and 2 clocks in his ED room.  Pt stated he feels he needs a higher dose of medications because they aren't working even though he reports taking them as prescribed.  Pt denies HI.  Pt denies SA, stating he has been sober since 01/2012.  Pt endorses sx of depression.  He stated he no longer wants to live a the home because he feels the staff has favorites, they take his money, and only give him cigarettes as payment for working for them.  He stated he needs to come into the hospital, see a psychiatrist, a counselor, a Child psychotherapist, and get his medications evaluated.  Pt was cooperative, oriented to person and place, had good eye contact, pressured speech, depressed mood, logical and coherent thought processes.  Pt has a hx of both inpatient and outpatient treatment.  Pt denies any legal charges or court dates.  He receives his psychotropic medications from [facility]  He has been in and out of group home placements his whole life and his grandmother, Tyrus Wilms, is his guardian by report.  He reported the group home [facility]) is his payee.  Pt stated his family is in [place].  As pt is reporting having command hallucinations, consulted with EDP Rancour at [facility] @ 1710 who agreed a tele psych by a [facility] extender warranted for further examination and recommendations.  Updated ED and TTS staff.  Axis I: Schizoaffective Disorder, 314.01 Attention-deficit/hyperactivity disorder, Predominantly hyperactive/impulsive presentation (per last psychiatry note) Axis II: Deferred Axis III:  Past  Medical History  Diagnosis Date  . Asthma   . Seizures   . Bipolar 1 disorder   . Schizophrenia   . ADHD (attention deficit hyperactivity disorder)    Axis IV: other psychosocial or environmental problems, problems related to social environment and problems with primary support group Axis V: 31-40 impairment in reality testing  Past Medical History:  Past Medical History  Diagnosis Date  . Asthma   . Seizures   . Bipolar 1 disorder   . Schizophrenia   . ADHD (attention deficit hyperactivity disorder)     Past Surgical History  Procedure Laterality Date  . None    . No past surgeries    . Colonoscopy with esophagogastroduodenoscopy (egd)  10/28/2012    WUJ:WJXBJY tiny distal esophageal erosions consistent with mild erosive reflux esophagitis. Hiatal hernia. 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 no h.pylori    Family History:  Family History  Problem Relation Age of Onset  . Seizures Father   . Asthma Other   . Seizures Other   . Cancer Other     aunt    Social History:  reports that he has been smoking Cigarettes.  He has a 6 pack-year smoking history. He does not have any smokeless tobacco history on file. He reports that he uses illicit drugs. He reports that he does not drink alcohol.  Additional Social History:  Alcohol / Drug Use Pain Medications: see med list Prescriptions: see med list Over the Counter: see med list History of alcohol / drug use?: Yes (None since 01/2012) Longest period of sobriety (when/how long): Sober since 01/2012 Negative Consequences of Use:  (na) Withdrawal Symptoms:  (na)  CIWA: CIWA-Ar BP: 114/74 mmHg Pulse Rate: 74 COWS:    PATIENT STRENGTHS: (choose at least two) Communication skills General fund of knowledge Supportive family/friends  Allergies:  Allergies  Allergen Reactions  .  Acetaminophen Hives  . Bee Pollen Hives  . Shellfish Allergy Hives    Home Medications:  (Not in a hospital admission)  OB/GYN Status:  No LMP for male patient.  General Assessment Data Location of Assessment: [facility] Is this a Tele or Face-to-Face Assessment?: Tele Assessment Is this an Initial Assessment or a Re-assessment for this encounter?: Initial Assessment Living Arrangements: Other (Comment) (Assisted Living Facility) Can pt return to current living arrangement?: Yes Admission Status: Voluntary Is patient capable of signing voluntary admission?: Yes Transfer from: Group Home Referral Source[facility]Family[facility]Friend     [facility] Living Arrangements: Other (Comment) (Assisted Living Facility) Name of Psychiatrist: [facility] Name of Therapist: none  Education Status Is patient currently in school?: No  Risk to self with the past 6 months Suicidal Ideation: Yes-Currently Present Suicidal Intent: Yes-Currently Present Is patient at risk for suicide?: Yes Suicidal Plan?: No Access to Means: No What has been your use of drugs/alcohol within the last 12 months?: pt denies use since 01/2012 Previous Attempts/Gestures: Yes How many times?: 1 Other Self Harm Risks: pt denies Triggers for Past Attempts: Unpredictable Intentional Self Injurious Behavior: None Family Suicide History: No Recent stressful life event(s): Conflict (Comment) (Reports [facility], doesn't like group home) Persecutory voices/beliefs?: No Depression: Yes Depression Symptoms: Despondent;Insomnia Substance abuse history and/or treatment for substance abuse?: Yes Suicide prevention information given to non-admitted patients: Not applicable  Risk to Others within the past 6 months Homicidal Ideation: No Thoughts of Harm to Others: No Current Homicidal Intent: No Current Homicidal Plan: No Access to Homicidal Means: No Identified Victim: na History of harm to others?: No Assessment of Violence:  None Noted Violent Behavior Description: na - pt calm, cooperative Does patient have access to weapons?: No Criminal Charges Pending?: No Does patient have a court date: No  Psychosis Hallucinations: Auditory;Visual;With command (Reports hears voices telling him to harm himself, sees thing) Delusions: None noted  Mental Status Report Appear/Hygiene: Unremarkable (In regular clothing) Eye Contact: Good Motor Activity: Freedom of movement;Unremarkable Speech: Logical/coherent;Rapid;Pressured Level of Consciousness: Alert Mood: Depressed Affect: Depressed Anxiety Level: None Thought Processes: Coherent;Relevant Judgement: Unimpaired Orientation: Person;Place;Situation Obsessive Compulsive Thoughts/Behaviors: None  Cognitive Functioning Concentration: Normal Memory: Recent Intact;Remote Intact IQ: Average Insight: Poor Impulse Control: Fair Appetite: Good Weight Loss: 0 Weight Gain: 0 Sleep: Decreased Total Hours of Sleep:  (varies - reports wakes through night) Vegetative Symptoms: None  ADLScreening [facility] Assessment Services) Patient's cognitive ability adequate to safely complete daily activities?: Yes Patient able to express need for assistance with ADLs?: Yes Independently performs ADLs?: Yes (appropriate for developmental age)  Prior Inpatient Therapy Prior Inpatient Therapy: Yes Prior Therapy Dates: 2012 and other unknown dates Prior Therapy Facilty/Provider(s): [facility] Reason for Treatment: Unknown  Prior Outpatient Therapy Prior Outpatient Therapy: Yes Prior Therapy Dates: Current Prior Therapy Facilty/Provider(s): [facility] Reason for Treatment: med mgnt  ADL Screening (condition at time of admission) Patient's cognitive ability adequate to safely complete daily activities?: Yes Is the patient deaf or have difficulty  hearing?: No Does the patient have difficulty seeing, even when wearing glasses/contacts?: No Does the patient have difficulty concentrating,  remembering, or making decisions?: No Patient able to express need for assistance with ADLs?: Yes Does the patient have difficulty dressing or bathing?: No Independently performs ADLs?: Yes (appropriate for developmental age) Does the patient have difficulty walking or climbing stairs?: No  Home Assistive Devices/Equipment Home Assistive Devices/Equipment: None    Abuse/Neglect Assessment (Assessment to be complete while patient is alone) Physical Abuse: Denies Verbal Abuse: Denies Sexual Abuse: Denies Exploitation of patient/patient's resources: Denies Self-Neglect: Denies Values / Beliefs Cultural Requests During Hospitalization: None Spiritual Requests During Hospitalization: None Consults Spiritual Care Consult Needed: No Social Work Consult Needed: No Merchant navy officer (For Healthcare) Does patient have an advance directive?: No Would patient like information on creating an advanced directive?: No - patient declined information    Additional Information 1:1 In Past 12 Months?: No CIRT Risk: No Elopement Risk: No Does patient have medical clearance?: Yes     Disposition:  Disposition Initial Assessment Completed for this Encounter: Yes Disposition of Patient: Other dispositions Other disposition(s): Other (Comment) (Pt to be seen by [facility] extender for recommendations)  Casimer Lanius, MS, [facility] Licensed Professional Counselor Triage Specialist  06/12/2014 5:20 PM

## 2014-06-12 NOTE — ED Notes (Signed)
[facility]  Reports pt has been a resident of [facility] for 3 years and wants to be placed in another group home.  Denies SI or HI.

## 2014-06-12 NOTE — ED Provider Notes (Signed)
CSN: [id number]     Arrival date & time 06/12/14  1510 History  This chart was scribed for Glynn Octave, MD by Jarvis Morgan, ED Scribe. This patient was seen in room APA16A/APA16A and the patient's care was started at 3:27 PM.   Chief Complaint  Patient presents with  . V70.1   Level 5 Caveat- Psychiatric Disorder   The history is provided by the patient. No language interpreter was used.   HPI Comments: David Blankenship is a 28 y.o. male with a h/o asthma, seizures, bipolar 1 disorder, schizophrenia, and ADHD brought in by ambulance who presents to the Emergency Department due to wanting a new group home. He states group home staff is mean to him and he doesn't like it there. He states he called EMS on himself. Reports pt has been a resident of [facility] for 3 years and wants to be placed in another group home. States that he hates the staff and they are too strict. Notes he has been taking his medications. No new medications. Reports he has not been eating or drinking. States he has been lately having increased depression, auditory and sensory hallucinations for 2 weeks. Pt also reports some emesis yesterday but that has now resolved. He states that the voices in his head are telling him to kill himself. He denies any HI or SI.  Psychiatrist: Dr. Rosalia Hammers  Past Medical History  Diagnosis Date  . Asthma   . Seizures   . Bipolar 1 disorder   . Schizophrenia   . ADHD (attention deficit hyperactivity disorder)    Past Surgical History  Procedure Laterality Date  . None    . No past surgeries    . Colonoscopy with esophagogastroduodenoscopy (egd)  10/28/2012    NWG:NFAOZH tiny distal esophageal erosions consistent with mild erosive reflux esophagitis. Hiatal hernia. 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 no h.pylori   Family  History  Problem Relation Age of Onset  . Seizures Father   . Asthma Other   . Seizures Other   . Cancer Other     aunt   History  Substance Use Topics  . Smoking status: Current Every Day Smoker -- 0.50 packs/day for 12 years    Types: Cigarettes  . Smokeless tobacco: Not on file     Comment: 5 or 6 cigarettes daily  . Alcohol Use: No     Comment: Quit drinking alcoholic beverages. No etoh since 01/2012    Review of Systems  Unable to perform ROS: Psychiatric disorder  Gastrointestinal: Positive for vomiting. Negative for nausea.  Psychiatric/Behavioral: Positive for hallucinations. Negative for suicidal ideas. The patient is nervous/anxious.        No HI  All other systems reviewed and are negative.     Allergies  Acetaminophen; Bee pollen; and Shellfish allergy  Home Medications   Prior to Admission medications   Medication Sig Start Date End Date Taking? Authorizing Provider  benztropine (COGENTIN) 0.5 MG tablet Take 0.5 mg by mouth 2 (two) times daily.   Yes Historical Provider, MD  buPROPion (WELLBUTRIN XL) 150 MG 24 hr tablet Take 1 tablet (150 mg total) by mouth every morning. 04/04/14  Yes Shuvon Rankin, NP  dicyclomine (BENTYL) 10 MG capsule Take 1 capsule (10 mg total) by mouth 4 (four) times daily -  before meals and at bedtime. 06/08/14  Yes Nira Retort, NP  divalproex (DEPAKOTE)  500 MG DR tablet Take 2 tablets (1,000 mg total) by mouth at bedtime. 04/04/14  Yes Shuvon Rankin, NP  haloperidol (HALDOL) 5 MG tablet Take 10 mg by mouth at bedtime.   Yes Historical Provider, MD  Linaclotide Karlene Einstein) 145 MCG CAPS capsule Take 1 capsule (145 mcg total) by mouth daily. 30 minutes before breakfast on an empty stomach. 04/22/14  Yes Nira Retort, NP  naproxen (NAPROSYN) 500 MG tablet Take 500 mg by mouth 2 (two) times daily as needed. pain   Yes Historical Provider, MD  pantoprazole (PROTONIX) 40 MG tablet Take 40 mg by mouth every morning.   Yes Historical Provider, MD   traZODone (DESYREL) 50 MG tablet Take 1 tablet (50 mg total) by mouth at bedtime. 04/04/14  Yes Shuvon Rankin, NP   Triage Vitals: BP 114/74  Pulse 74  Temp(Src) 98 F (36.7 C) (Oral)  Resp 20  Ht  (1.854 m)  Wt 185 lb (83.915 kg)  BMI 24.41 kg/m2  SpO2 100%  Physical Exam  Nursing note and vitals reviewed. Constitutional: He appears well-developed and well-nourished. No distress.  Pressured speech, flight of ideas.  HENT:  Head: Normocephalic and atraumatic.  Mouth/Throat: Oropharynx is clear and moist. No oropharyngeal exudate.  Eyes: Conjunctivae and EOM are normal. Pupils are equal, round, and reactive to light.  Neck: Normal range of motion. Neck supple.  No meningismus.  Cardiovascular: Normal rate, regular rhythm, normal heart sounds and intact distal pulses.   No murmur heard. Pulmonary/Chest: Effort normal and breath sounds normal. No respiratory distress.  Abdominal: Soft. There is no tenderness. There is no rebound and no guarding.  Musculoskeletal: Normal range of motion. He exhibits no edema and no tenderness.  Neurological: He is alert. No cranial nerve deficit. He exhibits normal muscle tone. Coordination normal.  No ataxia on finger to nose bilaterally. No pronator drift. 5/5 strength throughout. CN 2-12 intact. Negative Romberg. Equal grip strength. Sensation intact. Gait is normal.. No clonus. Oriented x2  Skin: Skin is warm.  Psychiatric: He has a normal mood and affect. His behavior is normal.    ED Course  Procedures (including critical care time)  DIAGNOSTIC STUDIES: Oxygen Saturation is 100% on RA, normal by my interpretation.    COORDINATION OF CARE: 3:36 PM- Will order TTS consult, CBC w/diff, CMP, ethanol stat, and urine drug screen.  Pt advised of plan for treatment and pt agrees.    Labs Review Labs Reviewed  CBC WITH DIFFERENTIAL - Abnormal; Notable for the following:    HCT 38.1 (*)    All other components within normal limits   COMPREHENSIVE METABOLIC PANEL - Abnormal; Notable for the following:    GFR calc non Af Amer 84 (*)    All other components within normal limits  ETHANOL  URINE RAPID DRUG SCREEN (HOSP PERFORMED)  VALPROIC ACID LEVEL    Imaging Review No results found.   EKG Interpretation None      MDM   Final diagnoses:  Schizophrenia, unspecified type   patient states he is unhappy with the group home he has been at for the past 2 years. He denies any suicidal or homicidal thoughts. He states that his mean to him. He has been hearing voices for the past 2 weeks that have been telling him to hurt himself.  Labs unremarkable. depakote level therapeutic.  Patient has been seen by psychiatry.  He is displeased with his group home situation but does not meet inpatient criteria for hospitalization.  No SI or HI.  Patient became upset when other group home residents were receiving more attention than he was.  Per NP Link Snuffer, he is psychiatrically clear for return to group home and agreeable.  BP 121/87  Pulse 66  Temp(Src) 98 F (36.7 C) (Oral)  Resp 20  Ht  (1.854 m)  Wt 185 lb (83.915 kg)  BMI 24.41 kg/m2  SpO2 100%   I personally performed the services described in this documentation, which was scribed in my presence. The recorded information has been reviewed and is accurate.   Glynn Octave, MD 06/13/14 [phone]

## 2014-06-12 NOTE — Consult Note (Signed)
Telepsych Consultation   Reason for Consult:  Psychiatric Evaluation Referring Physician:  EDP Rancour David Blankenship is an 28 y.o. male.  Assessment: AXIS I:  ADHD, hyperactive type and Schizoaffective Disorder AXIS II:  Deferred AXIS III:   Past Medical History  Diagnosis Date  . Asthma   . Seizures   . Bipolar 1 disorder   . Schizophrenia   . ADHD (attention deficit hyperactivity disorder)    AXIS IV:  housing problems and other psychosocial or environmental problems AXIS V:  51-60 moderate symptoms  Plan:  No evidence of imminent risk to self or others at present.   Patient does not meet criteria for psychiatric inpatient admission.  Subjective:   David Blankenship is a 28 y.o. male patient who presented voluntarily to [facility] Emergency Department stating that he has been hearing voices for the past 2 weeks. Patient currently resides in a group home ([facility]). Patient states that he no longer wants to live at the group home. Patient states "I want to find another place to go." The patient states "the group home is too strict, they show favoritism towards other residents, [place] and I[place]."   Patient states the voice "tell me to kill myself but I don't listen to them. I just talk back to them or laugh at them." Patient further states "I really want to find another place to go; we only get paid once a month there." Patient states he called his grandmother today and "she was acting funny towards me like she don't have no more love for me." Patient states he came to the hospital so "I could go to [employer] and meet the social worker so she can find me a new group home because that's what they did last time."  Patient denies suicidal or homicidal ideation, intent or plan. Denies visual hallucinations.   Spoke with Archer Asa, who works at the group home, at [phone]. She states the patient "is spoiled rotten." She states the  patient became upset today after another resident at the group was picked up by a family member. Ms. Hoover Browns states that the patient called his grandmother who told him she was busy at the church and the patient became upset.  The patient felt that since the other resident left the group home he should be able to leave too.   HPI:  28 yo male who presents for evaluation of hearing voices for 2 weeks. HPI Elements:   Location:  Thought process. Quality:  Auditory hallucinations. Severity:  Moderate. Timing:  Daily. Duration:  2 weeks. Context:  Dissatisfaction with current group home.  Past Psychiatric History: Past Medical History  Diagnosis Date  . Asthma   . Seizures   . Bipolar 1 disorder   . Schizophrenia   . ADHD (attention deficit hyperactivity disorder)     reports that he has been smoking Cigarettes.  He has a 6 pack-year smoking history. He does not have any smokeless tobacco history on file. He reports that he uses illicit drugs. He reports that he does not drink alcohol. Family History  Problem Relation Age of Onset  . Seizures Father   . Asthma Other   . Seizures Other   . Cancer Other     aunt   Family History Substance Abuse:  (Unknown) Family Supports: Yes, List: (Grandmother, ASL) Living Arrangements: Other (Comment) ([place]) Can pt return to current living arrangement?: Yes Allergies:  Allergies  Allergen Reactions  . Acetaminophen Hives  . Bee Pollen Hives  . Shellfish Allergy Hives    ACT Assessment Complete:  Yes:    Educational Status    Risk to Self: Risk to self with the past 6 months Suicidal Ideation: Yes-Currently Present Suicidal Intent: Yes-Currently Present Is patient at risk for suicide?: Yes Suicidal Plan?: No Access to Means: No What has been your use of drugs/alcohol within the last 12 months?: pt denies use since 01/2012 Previous Attempts/Gestures: Yes How many times?: 1 Other Self Harm Risks: pt denies Triggers  for Past Attempts: Unpredictable Intentional Self Injurious Behavior: None Family Suicide History: No Recent stressful life event(s): Conflict (Comment) (Reports [facility]H, doesn't like group home) Persecutory voices/beliefs?: No Depression: Yes Depression Symptoms: Despondent;Insomnia Substance abuse history and/or treatment for substance abuse?: Yes Suicide prevention information given to non-admitted patients: Not applicable  Risk to Others: Risk to Others within the past 6 months Homicidal Ideation: No Thoughts of Harm to Others: No Current Homicidal Intent: No Current Homicidal Plan: No Access to Homicidal Means: No Identified Victim: na History of harm to others?: No Assessment of Violence: None Noted Violent Behavior Description: na - pt calm, cooperative Does patient have access to weapons?: No Criminal Charges Pending?: No Does patient have a court date: No  Abuse: Abuse/Neglect Assessment (Assessment to be complete while patient is alone) Physical Abuse: Denies Verbal Abuse: Denies Sexual Abuse: Denies Exploitation of patient/patient's resources: Denies Self-Neglect: Denies  Prior Inpatient Therapy: Prior Inpatient Therapy Prior Inpatient Therapy: Yes Prior Therapy Dates: 2012 and other unknown dates Prior Therapy Facilty/Provider(s): [facility] Reason for Treatment: Unknown  Prior Outpatient Therapy: Prior Outpatient Therapy Prior Outpatient Therapy: Yes Prior Therapy Dates: Current Prior Therapy Facilty/Provider(s): [facility] Reason for Treatment: med mgnt  Additional Information: Additional Information 1:1 In Past 12 Months?: No CIRT Risk: No Elopement Risk: No Does patient have medical clearance?: Yes    Objective: Blood pressure 121/87, pulse 66, temperature 98 F (36.7 C), temperature source Oral, resp. rate 20, height $RemoveBe'6\' 1"'gjoijMHlf$  (1.854 m), weight 83.915 kg (185 lb), SpO2 100.00%.Body mass index is 24.41 kg/(m^2). Results for orders placed during the hospital encounter of  06/12/14 (from the past 72 hour(s))  URINE RAPID DRUG SCREEN (HOSP PERFORMED)     Status: None   Collection Time    06/12/14  3:24 PM      Result Value Ref Range   Opiates NONE DETECTED  NONE DETECTED   Cocaine NONE DETECTED  NONE DETECTED   Benzodiazepines NONE DETECTED  NONE DETECTED   Amphetamines NONE DETECTED  NONE DETECTED   Tetrahydrocannabinol NONE DETECTED  NONE DETECTED   Barbiturates NONE DETECTED  NONE DETECTED   Comment:            DRUG SCREEN FOR MEDICAL PURPOSES     ONLY.  IF CONFIRMATION IS NEEDED     FOR ANY PURPOSE, NOTIFY LAB     WITHIN 5 DAYS.                LOWEST DETECTABLE LIMITS     FOR URINE DRUG SCREEN     Drug Class       Cutoff (ng/mL)     Amphetamine      1000     Barbiturate      200     Benzodiazepine   034     Tricyclics       742     Opiates  300     Cocaine          300     THC              50  CBC WITH DIFFERENTIAL     Status: Abnormal   Collection Time    06/12/14  3:40 PM      Result Value Ref Range   WBC 5.4  4.0 - 10.5 K/uL   RBC 4.35  4.22 - 5.81 MIL/uL   Hemoglobin 13.2  13.0 - 17.0 g/dL   HCT 38.1 (*) 39.0 - 52.0 %   MCV 87.6  78.0 - 100.0 fL   MCH 30.3  26.0 - 34.0 pg   MCHC 34.6  30.0 - 36.0 g/dL   RDW 14.3  11.5 - 15.5 %   Platelets 183  150 - 400 K/uL   Neutrophils Relative % 58  43 - 77 %   Neutro Abs 3.2  1.7 - 7.7 K/uL   Lymphocytes Relative 31  12 - 46 %   Lymphs Abs 1.7  0.7 - 4.0 K/uL   Monocytes Relative 8  3 - 12 %   Monocytes Absolute 0.4  0.1 - 1.0 K/uL   Eosinophils Relative 3  0 - 5 %   Eosinophils Absolute 0.2  0.0 - 0.7 K/uL   Basophils Relative 0  0 - 1 %   Basophils Absolute 0.0  0.0 - 0.1 K/uL  COMPREHENSIVE METABOLIC PANEL     Status: Abnormal   Collection Time    06/12/14  3:40 PM      Result Value Ref Range   Sodium 140  137 - 147 mEq/L   Potassium 4.7  3.7 - 5.3 mEq/L   Chloride 101  96 - 112 mEq/L   CO2 29  19 - 32 mEq/L   Glucose, Bld 84  70 - 99 mg/dL   BUN 12  6 - 23 mg/dL    Creatinine, Ser 1.17  0.50 - 1.35 mg/dL   Calcium 9.6  8.4 - 10.5 mg/dL   Total Protein 7.5  6.0 - 8.3 g/dL   Albumin 4.4  3.5 - 5.2 g/dL   AST 17  0 - 37 U/L   ALT 8  0 - 53 U/L   Alkaline Phosphatase 39  39 - 117 U/L   Total Bilirubin 0.3  0.3 - 1.2 mg/dL   GFR calc non Af Amer 84 (*) >90 mL/min   GFR calc Af Amer >90  >90 mL/min   Comment: (NOTE)     The eGFR has been calculated using the CKD EPI equation.     This calculation has not been validated in all clinical situations.     eGFR's persistently <90 mL/min signify possible Chronic Kidney     Disease.   Anion gap 10  5 - 15  ETHANOL     Status: None   Collection Time    06/12/14  3:40 PM      Result Value Ref Range   Alcohol, Ethyl (B) <11  0 - 11 mg/dL   Comment:            LOWEST DETECTABLE LIMIT FOR     SERUM ALCOHOL IS 11 mg/dL     FOR MEDICAL PURPOSES ONLY  VALPROIC ACID LEVEL     Status: None   Collection Time    06/12/14  3:40 PM      Result Value Ref Range   Valproic Acid Lvl 70.8  50.0 -  100.0 ug/mL   Labs are reviewed and are essentially unremarkable.  Current Facility-Administered Medications  Medication Dose Route Frequency Provider Last Rate Last Dose  . benztropine (COGENTIN) tablet 0.5 mg  0.5 mg Oral BID Ezequiel Essex, MD      . Derrill Memo ON 06/13/2014] buPROPion (WELLBUTRIN XL) 24 hr tablet 150 mg  150 mg Oral q morning - 10a Ezequiel Essex, MD      . Derrill Memo ON 06/13/2014] dicyclomine (BENTYL) capsule 10 mg  10 mg Oral TID AC & HS Ezequiel Essex, MD      . divalproex (DEPAKOTE) DR tablet 1,000 mg  1,000 mg Oral QHS Ezequiel Essex, MD      . haloperidol (HALDOL) tablet 10 mg  10 mg Oral QHS Ezequiel Essex, MD      . Derrill Memo ON 06/13/2014] Linaclotide (LINZESS) capsule 145 mcg  145 mcg Oral QAC breakfast Ezequiel Essex, MD      . Derrill Memo ON 06/13/2014] pantoprazole (PROTONIX) EC tablet 40 mg  40 mg Oral q morning - 10a Ezequiel Essex, MD      . traZODone (DESYREL) tablet 50 mg  50 mg Oral QHS Ezequiel Essex, MD       Current Outpatient Prescriptions  Medication Sig Dispense Refill  . benztropine (COGENTIN) 0.5 MG tablet Take 0.5 mg by mouth 2 (two) times daily.      Marland Kitchen buPROPion (WELLBUTRIN XL) 150 MG 24 hr tablet Take 1 tablet (150 mg total) by mouth every morning.  30 tablet  1  . dicyclomine (BENTYL) 10 MG capsule Take 1 capsule (10 mg total) by mouth 4 (four) times daily -  before meals and at bedtime.  120 capsule  3  . divalproex (DEPAKOTE) 500 MG DR tablet Take 2 tablets (1,000 mg total) by mouth at bedtime.  60 tablet  1  . haloperidol (HALDOL) 5 MG tablet Take 10 mg by mouth at bedtime.      . Linaclotide (LINZESS) 145 MCG CAPS capsule Take 1 capsule (145 mcg total) by mouth daily. 30 minutes before breakfast on an empty stomach.  30 capsule  3  . naproxen (NAPROSYN) 500 MG tablet Take 500 mg by mouth 2 (two) times daily as needed. pain      . pantoprazole (PROTONIX) 40 MG tablet Take 40 mg by mouth every morning.      . traZODone (DESYREL) 50 MG tablet Take 1 tablet (50 mg total) by mouth at bedtime.  30 tablet  1    Psychiatric Specialty Exam:     Blood pressure 121/87, pulse 66, temperature 98 F (36.7 C), temperature source Oral, resp. rate 20, height $RemoveBe'6\' 1"'fXkRPjggZ$  (1.854 m), weight 83.915 kg (185 lb), SpO2 100.00%.Body mass index is 24.41 kg/(m^2).  General Appearance: Fairly Groomed  Engineer, water::  Fair  Speech:  Pressured  Volume:  Normal  Mood:  Anxious  Affect:  Congruent  Thought Process:  Circumstantial  Orientation:  Full (Time, Place, and Person)  Thought Content:  Hallucinations: Auditory Command:  "To kill myself"  Suicidal Thoughts:  No  Homicidal Thoughts:  No  Memory:  Immediate;   Good Recent;   Fair Remote;   Fair  Judgement:  Fair  Insight:  Fair  Psychomotor Activity:  Normal  Concentration:  Fair  Recall:  Fair  Akathisia:  No  Handed:  Right  AIMS (if indicated):     Assets:  Communication Skills Housing Physical Health  Sleep:        Disposition: Discharge back to  group home when medically cleared. EDP Rancour updated on disposition at 2328.  Serena Colonel, FNP-BC 06/12/2014 11:19 PM Pt discussed and I agree with assessment Levonne Spiller MD

## 2014-06-14 ENCOUNTER — Ambulatory Visit (HOSPITAL_COMMUNITY)
Admission: RE | Admit: 2014-06-14 | Discharge: 2014-06-14 | Disposition: A | Discharge status: Home / Self Care | Payer: Medicaid Other | Source: Ambulatory Visit | Attending: Gastroenterology | Admitting: Gastroenterology

## 2014-06-14 DIAGNOSIS — R109 Unspecified abdominal pain: Secondary | ICD-10-CM | POA: Diagnosis not present

## 2014-06-16 NOTE — Progress Notes (Signed)
Quick Note:  [place] of abdomen negative. HIDA last done in Oct 2014. Could consider repeat now to see if ejection fraction has decreased. Dietary intake a large contributor to symptoms. May set it up if patient would like to proceed. ______

## 2014-06-16 NOTE — Progress Notes (Signed)
cc'd to pcp 

## 2014-06-30 ENCOUNTER — Other Ambulatory Visit: Payer: Self-pay | Admitting: Gastroenterology

## 2014-06-30 DIAGNOSIS — R1011 Right upper quadrant pain: Principal | ICD-10-CM

## 2014-06-30 DIAGNOSIS — G8929 Other chronic pain: Secondary | ICD-10-CM

## 2014-07-02 ENCOUNTER — Encounter (HOSPITAL_COMMUNITY)
Admission: RE | Admit: 2014-07-02 | Discharge: 2014-07-02 | Disposition: A | Discharge status: Home / Self Care | Payer: Medicaid Other | Source: Ambulatory Visit | Attending: Diagnostic Radiology | Admitting: Diagnostic Radiology

## 2014-07-02 ENCOUNTER — Encounter (HOSPITAL_COMMUNITY): Payer: Self-pay

## 2014-07-02 DIAGNOSIS — G8929 Other chronic pain: Secondary | ICD-10-CM

## 2014-07-02 DIAGNOSIS — R1011 Right upper quadrant pain: Secondary | ICD-10-CM

## 2014-07-02 DIAGNOSIS — R109 Unspecified abdominal pain: Secondary | ICD-10-CM | POA: Diagnosis present

## 2014-07-02 DIAGNOSIS — R112 Nausea with vomiting, unspecified: Secondary | ICD-10-CM | POA: Diagnosis not present

## 2014-07-02 MED ORDER — SINCALIDE 5 MCG IJ SOLR
INTRAMUSCULAR | Status: AC
  Administered 2014-07-02: 1.68 ug via INTRAVENOUS
  Filled 2014-07-02: qty 5

## 2014-07-02 MED ORDER — TECHNETIUM TC 99M MEBROFENIN IV KIT
5.0000 | PACK | Freq: Once | INTRAVENOUS | Status: AC | PRN
  Administered 2014-07-02: 5.3 via INTRAVENOUS

## 2014-07-02 MED ORDER — STERILE WATER FOR INJECTION IJ SOLN
INTRAMUSCULAR | Status: AC
  Administered 2014-07-02: 1.68 mL via INTRAVENOUS
  Filled 2014-07-02: qty 10

## 2014-07-13 ENCOUNTER — Other Ambulatory Visit: Payer: Self-pay

## 2014-07-13 DIAGNOSIS — R109 Unspecified abdominal pain: Secondary | ICD-10-CM

## 2014-07-13 NOTE — Progress Notes (Signed)
Quick Note:  Great EF at 94% on HIDA; however, he did have reproduction of symptoms with CCK. I'm not convinced a cholecystectomy will bring resolution to his symptoms. Can refer for consideration of elective lap chole with Dr. Lovell Sheehan. ______

## 2014-07-15 ENCOUNTER — Other Ambulatory Visit: Payer: Self-pay | Admitting: General Practice

## 2014-07-15 DIAGNOSIS — R1011 Right upper quadrant pain: Secondary | ICD-10-CM | POA: Insufficient documentation

## 2014-07-15 NOTE — Progress Notes (Signed)
Quick Note:  Referral faxed to Dr. Jenkins ______ 

## 2014-07-18 ENCOUNTER — Emergency Department (HOSPITAL_COMMUNITY): Payer: Medicaid Other

## 2014-07-18 ENCOUNTER — Emergency Department (HOSPITAL_COMMUNITY)
Admission: EM | Admit: 2014-07-18 | Discharge: 2014-07-18 | Disposition: A | Discharge status: Home / Self Care | Payer: Medicaid Other | Attending: Emergency Medicine | Admitting: Emergency Medicine

## 2014-07-18 ENCOUNTER — Encounter (HOSPITAL_COMMUNITY): Payer: Self-pay | Admitting: Emergency Medicine

## 2014-07-18 DIAGNOSIS — F17200 Nicotine dependence, unspecified, uncomplicated: Secondary | ICD-10-CM | POA: Diagnosis not present

## 2014-07-18 DIAGNOSIS — R0789 Other chest pain: Secondary | ICD-10-CM | POA: Diagnosis not present

## 2014-07-18 DIAGNOSIS — Z79899 Other long term (current) drug therapy: Secondary | ICD-10-CM | POA: Insufficient documentation

## 2014-07-18 DIAGNOSIS — K219 Gastro-esophageal reflux disease without esophagitis: Secondary | ICD-10-CM | POA: Insufficient documentation

## 2014-07-18 DIAGNOSIS — Z791 Long term (current) use of non-steroidal anti-inflammatories (NSAID): Secondary | ICD-10-CM | POA: Insufficient documentation

## 2014-07-18 DIAGNOSIS — R109 Unspecified abdominal pain: Secondary | ICD-10-CM | POA: Diagnosis not present

## 2014-07-18 DIAGNOSIS — R079 Chest pain, unspecified: Secondary | ICD-10-CM | POA: Diagnosis present

## 2014-07-18 DIAGNOSIS — F319 Bipolar disorder, unspecified: Secondary | ICD-10-CM | POA: Insufficient documentation

## 2014-07-18 DIAGNOSIS — J45909 Unspecified asthma, uncomplicated: Secondary | ICD-10-CM | POA: Diagnosis not present

## 2014-07-18 DIAGNOSIS — Z8659 Personal history of other mental and behavioral disorders: Secondary | ICD-10-CM | POA: Diagnosis not present

## 2014-07-18 DIAGNOSIS — M549 Dorsalgia, unspecified: Secondary | ICD-10-CM | POA: Diagnosis not present

## 2014-07-18 DIAGNOSIS — R630 Anorexia: Secondary | ICD-10-CM | POA: Insufficient documentation

## 2014-07-18 DIAGNOSIS — F909 Attention-deficit hyperactivity disorder, unspecified type: Secondary | ICD-10-CM | POA: Insufficient documentation

## 2014-07-18 LAB — COMPREHENSIVE METABOLIC PANEL
ALT: 9 U/L (ref 0–53)
AST: 15 U/L (ref 0–37)
Albumin: 3.7 g/dL (ref 3.5–5.2)
Alkaline Phosphatase: 44 U/L (ref 39–117)
Anion gap: 9 (ref 5–15)
BUN: 16 mg/dL (ref 6–23)
CO2: 27 mEq/L (ref 19–32)
CREATININE: 1.09 mg/dL (ref 0.50–1.35)
Calcium: 9.4 mg/dL (ref 8.4–10.5)
Chloride: 98 mEq/L (ref 96–112)
GFR calc non Af Amer: >90 mL/min (ref >90)
Glucose, Bld: 79 mg/dL (ref 70–99)
Potassium: 4.6 mEq/L (ref 3.7–5.3)
SODIUM: 134 meq/L — AB (ref 137–147)
TOTAL PROTEIN: 7 g/dL (ref 6.0–8.3)
Total Bilirubin: 0.4 mg/dL (ref 0.3–1.2)

## 2014-07-18 LAB — URINALYSIS, ROUTINE W REFLEX MICROSCOPIC
Bilirubin Urine: NEGATIVE
Glucose, UA: NEGATIVE mg/dL
Hgb urine dipstick: NEGATIVE
KETONES UR: NEGATIVE mg/dL
LEUKOCYTES UA: NEGATIVE
Nitrite: NEGATIVE
PROTEIN: NEGATIVE mg/dL
Specific Gravity, Urine: <1.005 — ABNORMAL LOW (ref 1.005–1.030)
Urobilinogen, UA: 0.2 mg/dL (ref 0.0–1.0)
pH: 7 (ref 5.0–8.0)

## 2014-07-18 LAB — CBC
HCT: 37.1 % — ABNORMAL LOW (ref 39.0–52.0)
Hemoglobin: 12.9 g/dL — ABNORMAL LOW (ref 13.0–17.0)
MCH: 30.3 pg (ref 26.0–34.0)
MCHC: 34.8 g/dL (ref 30.0–36.0)
MCV: 87.1 fL (ref 78.0–100.0)
PLATELETS: 138 10*3/uL — AB (ref 150–400)
RBC: 4.26 MIL/uL (ref 4.22–5.81)
RDW: 14.1 % (ref 11.5–15.5)
WBC: 5.1 10*3/uL (ref 4.0–10.5)

## 2014-07-18 LAB — CK: Total CK: 218 U/L (ref 7–232)

## 2014-07-18 LAB — TROPONIN I: Troponin I: <0.3 ng/mL (ref <0.30)

## 2014-07-18 MED ORDER — CYCLOBENZAPRINE HCL 10 MG PO TABS
10.0000 mg | ORAL_TABLET | Freq: Once | ORAL | Status: AC
  Administered 2014-07-18: 10 mg via ORAL
  Filled 2014-07-18: qty 1

## 2014-07-18 MED ORDER — CYCLOBENZAPRINE HCL 5 MG PO TABS: 5.0000 mg | ORAL_TABLET | Freq: Three times a day (TID) | ORAL | Status: DC | PRN

## 2014-07-18 MED ORDER — NAPROXEN SODIUM 550 MG PO TABS: 550.0000 mg | ORAL_TABLET | Freq: Two times a day (BID) | ORAL | Status: DC

## 2014-07-18 MED ORDER — KETOROLAC TROMETHAMINE 30 MG/ML IJ SOLN
30.0000 mg | Freq: Once | INTRAMUSCULAR | Status: AC
  Administered 2014-07-18: 30 mg via INTRAVENOUS
  Filled 2014-07-18: qty 1

## 2014-07-18 MED ORDER — SODIUM CHLORIDE 0.9 % IV SOLN
1000.0000 mL | Freq: Once | INTRAVENOUS | Status: AC
  Administered 2014-07-18: 1000 mL via INTRAVENOUS

## 2014-07-18 MED ORDER — SODIUM CHLORIDE 0.9 % IV SOLN: 1000.0000 mL | INTRAVENOUS | Status: DC

## 2014-07-18 NOTE — ED Notes (Signed)
Chest pain since 0900, pain reproducible per EMS, radiates to back per pt., denies N/V or diaphoreses, pt. From [facility]

## 2014-07-18 NOTE — Discharge Instructions (Signed)
Drink plenty of fluids. Take the medications as prescribed. Recheck if you get a fever, cough, struggle to breathe or feel worse.

## 2014-07-18 NOTE — ED Notes (Addendum)
Talked with caregiver at [facility], reviewed discharge instructions and prescriptions. Caregiver to pick pt. Up promptly.

## 2014-07-18 NOTE — ED Provider Notes (Signed)
CSN: [id number]     Arrival date & time 07/18/14  1309 History  This chart was scribed for Ward Givens, MD by Richarda Overlie, ED Scribe. This patient was seen in room APA10/APA10 and the patient's care was started at 1:25PM.     Chief Complaint  Patient presents with  . Chest Pain    The history is provided by the patient. No language interpreter was used.   HPI Comments: David Blankenship is a 28 y.o. male with a history of GERD and asthma who presents to the Emergency Department complaining of constant, sharp chest pain that started this morning at 9am when he was smoking a cigarette. He rates it a 10/10 and states that it radiates to his abdomen and back and all over his body.  States the symptoms worsen with deep breathing and movement. Nothing makes it feel better. He reports that he has experienced similar symptoms before when he would chain smoke cigarettes. The patient reports doing "1,000 pushups and situps" last night and he usually does about 500 each daily. He states he began to notice his current symptoms mildy last night. He has a history of smoking and currently smokes 1/2ppd, he denies drinking ETOH. Denies SOB, burning in his throat cough, wheezing.    His PCP is Dr. Felecia Shelling.   Past Medical History  Diagnosis Date  . Asthma   . Seizures   . Bipolar 1 disorder   . Schizophrenia   . ADHD (attention deficit hyperactivity disorder)    Past Surgical History  Procedure Laterality Date  . None    . No past surgeries    . Colonoscopy with esophagogastroduodenoscopy (egd)  10/28/2012    WUJ:WJXBJY tiny distal esophageal erosions consistent with mild erosive reflux esophagitis. Hiatal hernia. 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 no h.pylori   Family History  Problem Relation Age of Onset  . Seizures Father   . Asthma Other   .  Seizures Other   . Cancer Other     aunt   History  Substance Use Topics  . Smoking status: Current Every Day Smoker -- 0.50 packs/day for 12 years    Types: Cigarettes  . Smokeless tobacco: Not on file     Comment: 5 or 6 cigarettes daily  . Alcohol Use: No     Comment: Quit drinking alcoholic beverages. No etoh since 01/2012  states he lives in ALF  Review of Systems  Constitutional: Positive for appetite change.  Respiratory: Negative for cough, shortness of breath and wheezing.   Cardiovascular: Positive for chest pain.  Gastrointestinal: Positive for abdominal pain.  Musculoskeletal: Positive for back pain.  All other systems reviewed and are negative.     Allergies  Acetaminophen; Bee pollen; and Shellfish allergy  Home Medications   Prior to Admission medications   Medication Sig Start Date End Date Taking? Authorizing Provider  benztropine (COGENTIN) 0.5 MG tablet Take 0.5 mg by mouth 2 (two) times daily.   Yes Historical Provider, MD  buPROPion (WELLBUTRIN XL) 150 MG 24 hr tablet Take 1 tablet (150 mg total) by mouth every morning. 04/04/14  Yes Shuvon Rankin, NP  dicyclomine (BENTYL) 10 MG capsule Take 1 capsule (10 mg total) by mouth 4 (four) times daily -  before meals and at bedtime. 06/08/14  Yes Nira Retort, NP  divalproex (DEPAKOTE) 500 MG DR tablet Take 2 tablets (1,000 mg total)  by mouth at bedtime. 04/04/14  Yes Shuvon Rankin, NP  haloperidol (HALDOL) 5 MG tablet Take 10 mg by mouth at bedtime.   Yes Historical Provider, MD  haloperidol decanoate (HALDOL DECANOATE) 100 MG/ML injection Inject 100 mg into the muscle every 28 (twenty-eight) days. Given by [facility]   Yes Historical Provider, MD  naproxen (NAPROSYN) 500 MG tablet Take 500 mg by mouth 2 (two) times daily as needed. pain   Yes Historical Provider, MD  pantoprazole (PROTONIX) 40 MG tablet Take 40 mg by mouth every morning.   Yes Historical Provider, MD  tamsulosin (FLOMAX) 0.4 MG CAPS capsule Take 0.4 mg  by mouth 2 (two) times daily.   Yes Historical Provider, MD  traZODone (DESYREL) 50 MG tablet Take 1 tablet (50 mg total) by mouth at bedtime. 04/04/14  Yes Shuvon Rankin, NP   BP 108/84  Pulse 62  Temp(Src) 98.1 F (36.7 C)  Resp 23  Ht  (1.803 m)  Wt 175 lb (79.379 kg)  BMI 24.42 kg/m2  SpO2 99%  Vital signs normal   Physical Exam  Nursing note and vitals reviewed. Constitutional: He is oriented to person, place, and time. He appears well-developed and well-nourished.  Non-toxic appearance. He does not appear ill. No distress.  Pt watching TV, states "the pain is KILLING me".  HENT:  Head: Normocephalic and atraumatic.  Right Ear: External ear normal.  Left Ear: External ear normal.  Nose: Nose normal. No mucosal edema or rhinorrhea.  Mouth/Throat: Oropharynx is clear and moist and mucous membranes are normal. No dental abscesses or uvula swelling.  Eyes: Conjunctivae and EOM are normal. Pupils are equal, round, and reactive to light.  Neck: Normal range of motion and full passive range of motion without pain. Neck supple.  Cardiovascular: Normal rate, regular rhythm and normal heart sounds.  Exam reveals no gallop and no friction rub.   No murmur heard. Pulmonary/Chest: Effort normal and breath sounds normal. No respiratory distress. He has no wheezes. He has no rhonchi. He has no rales. He exhibits no tenderness and no crepitus.  Abdominal: Soft. Normal appearance and bowel sounds are normal. He exhibits no distension. There is no tenderness. There is no rebound and no guarding.  Musculoskeletal: Normal range of motion. He exhibits no edema and no tenderness.  Moves all extremities well.   Neurological: He is alert and oriented to person, place, and time. He has normal strength. No cranial nerve deficit.  Skin: Skin is warm, dry and intact. No rash noted. No erythema. No pallor.  Psychiatric: He has a normal mood and affect. His speech is normal and behavior is normal. His  mood appears not anxious.    ED Course  Procedures (including critical care time)  Medications  0.9 %  sodium chloride infusion (0 mLs Intravenous Stopped 07/18/14 1519)    Followed by  0.9 %  sodium chloride infusion (not administered)  cyclobenzaprine (FLEXERIL) tablet 10 mg (not administered)  ketorolac (TORADOL) 30 MG/ML injection 30 mg (30 mg Intravenous Given 07/18/14 1354)     DIAGNOSTIC STUDIES: Oxygen Saturation is 99% on RA, normal by my interpretation.    COORDINATION OF CARE: 1:36 PM Will obtain blood work and administer pain medication in the ED. Patient agrees to treatment plan.   Pt states his pain is better, he is watching TV in NAD. Ready to be discharged.   Labs Review Results for orders placed during the hospital encounter of 07/18/14  CBC  Result Value Ref Range   WBC 5.1  4.0 - 10.5 K/uL   RBC 4.26  4.22 - 5.81 MIL/uL   Hemoglobin 12.9 (*) 13.0 - 17.0 g/dL   HCT 29.5 (*) 62.1 - 30.8 %   MCV 87.1  78.0 - 100.0 fL   MCH 30.3  26.0 - 34.0 pg   MCHC 34.8  30.0 - 36.0 g/dL   RDW 65.7  84.6 - 96.2 %   Platelets 138 (*) 150 - 400 K/uL  TROPONIN I      Result Value Ref Range   Troponin I <0.30  <0.30 ng/mL  CK      Result Value Ref Range   Total CK 218  7 - 232 U/L  URINALYSIS, ROUTINE W REFLEX MICROSCOPIC      Result Value Ref Range   Color, Urine YELLOW  YELLOW   APPearance CLEAR  CLEAR   Specific Gravity, Urine <1.005 (*) 1.005 - 1.030   pH 7.0  5.0 - 8.0   Glucose, UA NEGATIVE  NEGATIVE mg/dL   Hgb urine dipstick NEGATIVE  NEGATIVE   Bilirubin Urine NEGATIVE  NEGATIVE   Ketones, ur NEGATIVE  NEGATIVE mg/dL   Protein, ur NEGATIVE  NEGATIVE mg/dL   Urobilinogen, UA 0.2  0.0 - 1.0 mg/dL   Nitrite NEGATIVE  NEGATIVE   Leukocytes, UA NEGATIVE  NEGATIVE  COMPREHENSIVE METABOLIC PANEL      Result Value Ref Range   Sodium 134 (*) 137 - 147 mEq/L   Potassium 4.6  3.7 - 5.3 mEq/L   Chloride 98  96 - 112 mEq/L   CO2 27  19 - 32 mEq/L   Glucose,  Bld 79  70 - 99 mg/dL   BUN 16  6 - 23 mg/dL   Creatinine, Ser 9.52  0.50 - 1.35 mg/dL   Calcium 9.4  8.4 - 84.1 mg/dL   Total Protein 7.0  6.0 - 8.3 g/dL   Albumin 3.7  3.5 - 5.2 g/dL   AST 15  0 - 37 U/L   ALT 9  0 - 53 U/L   Alkaline Phosphatase 44  39 - 117 U/L   Total Bilirubin 0.4  0.3 - 1.2 mg/dL   GFR calc non Af Amer >90  >90 mL/min   GFR calc Af Amer >90  >90 mL/min   Anion gap 9  5 - 15      Imaging Review Dg Chest 2 View  07/18/2014   CLINICAL DATA:  Chest pain.  EXAM: CHEST  2 VIEW  COMPARISON:  December 31, 2005.  FINDINGS: The heart size and mediastinal contours are within normal limits. Both lungs are clear. No pneumothorax or pleural effusion is noted. The visualized skeletal structures are unremarkable.  IMPRESSION: No acute cardiopulmonary abnormality seen.   Electronically Signed   By: Roque Lias M.D.   On: 07/18/2014 15:55    Nm Hepato W/eject Fract  07/02/2014   CLINICAL DATA:  Abdominal pain with nausea and vomiting  IMPRESSION: Normal ejection fraction of radiotracer from the gallbladder. Patient did experience mild abdominal discomfort with the CCK administration. Cystic and common bile ducts are patent as is evidenced by visualization of gallbladder and small bowel.   Electronically Signed   By: Bretta Bang M.D.   On: 07/02/2014 11:31      EKG Interpretation None       Date: 07/18/2014  Rate: 60  Rhythm: normal sinus rhythm  QRS Axis: normal  Intervals: normal  ST/T Wave  abnormalities: early repolarization  Conduction Disutrbances:none  Narrative Interpretation:   Old EKG Reviewed: unchanged     MDM   Final diagnoses:  Atypical chest pain    New Prescriptions   CYCLOBENZAPRINE (FLEXERIL) 5 MG TABLET    Take 1 tablet (5 mg total) by mouth 3 (three) times daily as needed (muscle pain).   NAPROXEN SODIUM (ANAPROX DS) 550 MG TABLET    Take 1 tablet (550 mg total) by mouth 2 (two) times daily with a meal.    Plan discharge  Devoria Albe, MD, FACEP   I personally performed the services described in this documentation, which was scribed in my presence. The recorded information has been reviewed and considered.  Devoria Albe, MD, Armando Gang      Ward Givens, MD 07/18/14 [phone]

## 2014-07-20 ENCOUNTER — Encounter: Payer: Self-pay | Admitting: Gastroenterology

## 2014-07-20 ENCOUNTER — Encounter: Payer: Self-pay | Admitting: Internal Medicine

## 2014-07-20 ENCOUNTER — Ambulatory Visit (INDEPENDENT_AMBULATORY_CARE_PROVIDER_SITE_OTHER): Payer: Medicaid Other | Admitting: Gastroenterology

## 2014-07-20 VITALS — BP 118/67 | HR 80 | Temp 97.2°F | Ht 72.0 in | Wt 149.4 lb

## 2014-07-20 DIAGNOSIS — R109 Unspecified abdominal pain: Secondary | ICD-10-CM

## 2014-07-20 NOTE — Assessment & Plan Note (Addendum)
Chronic abdominal pain with obvious correlation to fatty/fried, greasy foods, some dairy products. Gallbladder remains in situ. Thorough work-up from a GI standpoint to include colonoscopy, EGD, CT scan, US, and HIDA. Most recent HIDA with excellent EF in the 90% range but reproduction of symptoms with CCK. At this point, he has been evaluated extensively from our standpoint. I discussed referral to Dr. Lovell SheehanJenkins for consideration of elective cholecystectomy, but I also stressed to the patient that he very well may have persistent symptoms even despite surgical intervention. I reiterated again that his dietary choices play a large role in his symptomatology. Will continue Protonix, Bentyl and Miralax as needed for IBS symptoms, trial of lactose free diet, and follow-up with Dr. Lovell SheehanJenkins as already scheduled. Return to see us in 3 months.

## 2014-07-20 NOTE — Progress Notes (Signed)
Referring Provider: Avon GullyFanta, Tesfaye, MD Primary Care Physician:  Avon GullyFANTA,TESFAYE, MD Primary GI: Dr. Jena Gaussourk   Chief Complaint  Patient presents with  . Follow-up  . Abdominal Cramping    HPI:   28 year old pleasant male returns today with history of chronic abdominal pain, erosive reflux esophagitis. History of schizophrenia, lives in group home. Poor historian. Prior work-up for gallbladder disease negative with US abdomen inJan 2014. HIDA Oct 2014 with EF 95% and no reproduction of symptoms. In interim from last visit, updated HIDA good EF at 94% but mild abdominal discomfort with CCK administration. Has appt with Dr. Lovell SheehanJenkins on 10/13.    Sweets upset stomach. Greens run right through him. UP all night last night after eating donuts. Pork skins bother him. Eating baked foods without any problem. Has had a colonoscopy and upper endoscopy as part of GI work-up. CT on file from July 2015 without acute findings.   Past Medical History  Diagnosis Date  . Asthma   . Seizures   . Bipolar 1 disorder   . Schizophrenia   . ADHD (attention deficit hyperactivity disorder)     Past Surgical History  Procedure Laterality Date  . None    . No past surgeries    . Colonoscopy with esophagogastroduodenoscopy (egd)  10/28/2012    WUJ:WJXBJYRMR:Single tiny distal esophageal erosions consistent with mild erosive reflux esophagitis. Hiatal hernia. 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 no h.pylori    Current Outpatient Prescriptions  Medication Sig Dispense Refill  . buPROPion (WELLBUTRIN XL) 150 MG 24 hr tablet Take 1 tablet (150 mg total) by mouth every morning.  30 tablet  1  . dicyclomine (BENTYL) 10 MG capsule Take 1 capsule (10 mg total) by mouth 4 (four) times daily -  before meals and at bedtime.  120 capsule  3  . divalproex (DEPAKOTE) 500 MG DR tablet  Take 2 tablets (1,000 mg total) by mouth at bedtime.  60 tablet  1  . haloperidol (HALDOL) 5 MG tablet Take 10 mg by mouth at bedtime.      . haloperidol decanoate (HALDOL DECANOATE) 100 MG/ML injection Inject 100 mg into the muscle every 28 (twenty-eight) days. [facility]      . naproxen (NAPROSYN) 500 MG tablet Take 500 mg by mouth 2 (two) times daily as needed. pain      . pantoprazole (PROTONIX) 40 MG tablet Take 40 mg by mouth every morning.      . tamsulosin (FLOMAX) 0.4 MG CAPS capsule Take 0.4 mg by mouth 2 (two) times daily.       No current facility-administered medications for this visit.    Allergies as of 07/20/2014 - Review Complete 07/20/2014  Allergen Reaction Noted  . Acetaminophen Hives 12/25/2011  . Bee pollen Hives 08/19/2013  . Shellfish allergy Hives 12/25/2011    Family History  Problem Relation Age of Onset  . Seizures Father   . Asthma Other   . Seizures Other   . Cancer Other     aunt    History   Social History  . Marital Status: Single    Spouse Name: N/A    Number of Children: N/A  . Years of Education: N/A   Social History Main Topics  . Smoking status: Current Every Day Smoker -- 0.50 packs/day for 12 years    Types: Cigarettes  . Smokeless tobacco: None  Comment: 5 or 6 cigarettes daily  . Alcohol Use: No     Comment: Quit drinking alcoholic beverages. No etoh since 01/2012  . Drug Use: Yes     Comment: none since 01/2012  . Sexual Activity: Yes    Birth Control/ Protection: Condom   Other Topics Concern  . None   Social History Narrative  . None    Review of Systems: As mentioned in HPI  Physical Exam: BP 118/67  Pulse 80  Temp(Src) 97.2 F (36.2 C) (Oral)  Ht 6' (1.829 m)  Wt 149 lb 6.4 oz (67.767 kg)  BMI 20.26 kg/m2 General:   Alert and oriented. No distress noted. Pleasant and cooperative.  Head:  Normocephalic and atraumatic. Eyes:  Conjuctiva clear without scleral icterus. Mouth:  Oral mucosa pink and  moist. Good dentition. No lesions. Abdomen:  +BS, soft, mild TTP epigastric and RUQ and non-distended. No rebound or guarding. No HSM or masses noted. Msk:  Symmetrical without gross deformities. Normal posture. Extremities:  Without edema. Neurologic:  Alert and  oriented x4;  grossly normal neurologically. Skin:  Intact without significant lesions or rashes. Psych:  Alert and cooperative. Normal mood and affect.  Lab Results  Component Value Date   WBC 5.1 07/18/2014   HGB 12.9* 07/18/2014   HCT 37.1* 07/18/2014   MCV 87.1 07/18/2014   PLT 138* 07/18/2014   Lab Results  Component Value Date   ALT 9 07/18/2014   AST 15 07/18/2014   ALKPHOS 44 07/18/2014   BILITOT 0.4 07/18/2014

## 2014-07-20 NOTE — Patient Instructions (Signed)
Limit and try to avoid dairy products.   Keep appointment with Dr. Lovell Sheehan in October.   Continue taking Protonix daily.   Take Bentyl as directed with meals and at bedtime. This is for loose stools and abdominal cramping.   Lactose-F ree Diet Lactose is a carbohydrate that is found mainly in milk and milk products, as well as in foods with added milk or whey. Lactose must be digested by the enzyme lactase in order to be used by the body. Lactose intolerance occurs when there is a shortage of lactase. When your body is not able to digest lactose, you may feel sick to your stomach (nausea), bloated, and have cramps, gas, and diarrhea. TYPES OF LACTASE DEFICIENCY  Primary lactase deficiency. This is the most common type. It is characterized by a slow decrease in lactase activity.  Secondary lactase deficiency. This occurs following injury to the small intestinal mucosa as a result of a disease or condition. It can also occur as a result of surgery or after treatment with antibiotic medicines or cancer drugs. Tolerance to lactose varies widely. Each person must determine how much milk can be consumed without developing symptoms. Drinking smaller portions of milk throughout the day may be helpful. Some studies suggest that slowing gastric emptying may help increase tolerance of milk products. This may be done by:  Consuming milk or milk products with a meal rather than alone.  Consuming milk with a higher fat content. There are many dairy products that may be tolerated better than milk by some people, including:  Cheese (especially aged cheese). The lactose content is much lower than in milk.  Cultured dairy products, such as yogurt, buttermilk, cottage cheese, and sweet acidophilus milk (kefir). These products are usually well tolerated by lactase-deficient people. This is because the healthy bacteria help digest lactose.  Lactose-hydrolyzed milk. This product contains 40% to 90% less  lactose than milk and may also be well tolerated. ADEQUACY These diets may be deficient in calcium, riboflavin, and vitamin D, according to the Recommended Dietary Allowances of the [employer]. Depending on individual tolerances and the use of milk substitutes, milk, or other dairy products, you may be able to meet these recommendations. SPECIAL NOTES  Lactose is a carbohydrate. The main food source for lactose is dairy products. Reading food labels is important. Many products contain lactose even when they are not made from milk. Look for the following words: whey, milk solids, dry milk solids, nonfat dry milk powder. Typical sources of lactose other than dairy products include breads, candies, cold cuts, prepared and processed foods, and commercial sauces and gravies.  All foods must be prepared without milk, cream, or other dairy foods.  A vitamin or mineral supplement may be necessary. Consult your caregiver or Registered Dietitian.  Lactose is also found in many prescription and over-the-counter medicines.  Soy milk and lactose-free supplements may be used as an alternative to milk. CHOOSING FOODS Breads and Starches  Allowed: Breads and rolls made without milk. [place], [place], or [place] bread. Soda crackers, graham crackers. Any crackers prepared without lactose. Cooked or dry cereals prepared without lactose (read labels). Any potatoes, pasta, or rice prepared without milk or lactose. Popcorn.  Avoid: Breads and rolls that contain milk. Prepared mixes such as muffins, biscuits, waffles, pancakes. Sweet rolls, donuts, [place] toast (if made with milk or lactose). Zwieback crackers, corn curls, or any crackers that contain lactose. Cooked or dry cereals prepared with lactose (read labels). Instant potatoes, frozen [place] fries,  scalloped or au gratin potatoes. Vegetables  Allowed: Fresh, frozen, and canned vegetables.  Avoid: Creamed or breaded vegetables. Vegetables in a  cheese sauce or with lactose-containing margarines. Fruit  Allowed: All fresh, canned, or frozen fruits that are not processed with lactose.  Avoid: Any canned or frozen fruits processed with lactose. Meat and Meat Substitutes  Allowed: Plain beef, chicken, fish, [place]aw[place]key, lamb, veal, pork, or ham. Kosher prepared meat products. Strained or junior meats that do not contain milk. Eggs, soy meat substitutes, nuts.  Avoid: Scrambled eggs, omelets, and souffles that contain milk. Creamed or breaded meat, fish, or fowl. Sausage products such as wieners, liver sausage, or cold cuts that contain milk solids. Cheese, cottage cheese, or cheese spreads. Milk  Allowed: None.  Avoid: Milk (whole, 2%, skim, or chocolate). Evaporated, powdered, or condensed milk. Malted milk. Soups and Combination Foods  Allowed: Bouillon, broth, vegetable soups, clear soups, consomms. Homemade soups made with allowed ingredients. Combination or prepared foods that do not contain milk or milk products (read labels).  Avoid: Cream soups, chowders, commercially prepared soups containing lactose. Macaroni and cheese, pizza. Combination or prepared foods that contain milk or milk products. Desserts and Sweets  Allowed: Water and fruit ices, gelatin, angel food cake. Homemade cookies, pies, or cakes made from allowed ingredients. Pudding (if made with water or a milk substitute). Lactose-free tofu desserts. Sugar, honey, corn syrup, jam, jelly, marmalade, molasses (beet sugar). Pure sugar candy, marshmallows.  Avoid: Ice cream, ice milk, sherbet, custard, pudding, frozen yogurt. Commercial cake and cookie mixes. Desserts that contain chocolate. Pie crust made with milk-containing margarine. Reduced calorie desserts made with a sugar substitute that contains lactose. Toffee, peppermint, butterscotch, chocolate, caramels. Fats and Oils  Allowed: Butter (as tolerated, contains very small amounts of lactose). Margarines and  dressings that do not contain milk. Vegetable oils, shortening, mayonnaise, nondairy cream and whipped toppings without lactose or milk solids added. Tomasa BlaseBacon.  Avoid: Margarines and salad dressings containing milk. Cream, cream cheese, peanut butter with added milk solids, sour cream, chip dips made with sour cream. Beverages  Allowed: Carbonated drinks, tea, coffee and freeze-dried coffee, some instant coffees (check labels). Fruit drinks, fruit and vegetable juice, rice or soy milk.  Avoid: Hot chocolate. Some cocoas, some instant coffees, instant iced teas, powdered fruit drinks (read labels). Condiments  Allowed: Soy sauce, carob powder, olives, gravy made with water, baker's cocoa, pickles, pure seasonings and spices, wine, pure monosodium glutamate, catsup, mustard.  Avoid: Some chewing gums, chocolate, some cocoas. Certain antibiotics and vitamin or mineral preparations. Spice blends if they contain milk products. MSG extender. Artificial sweeteners that contain lactose. Some nondairy creamers (read labels). SAMPLE MENU Breakfast  Orange juice.  Banana.  Bran cereal.  Nondairy creamer.  Vienna bread, toasted.  Butter or milk-free margarine.  Coffee or tea. Lunch  Chicken breast.  Rice.  Green beans.  Butter or milk-free margarine.  Fresh melon.  Coffee or tea. Dinner  Boeingoast beef.  Baked potato.  Butter or milk-free margarine.  Broccoli.  Lettuce salad with vinegar and oil dressing.  MGM M[employer]ngel food cake.  Coffee or tea. Document Released: 03/30/2002 Document Revised: 12/31/2011 Document Reviewed: 01/08/2014 [facility] Patient Information 2015 [place] [place]. This information is not intended to replace advice given to you by your health care provider. Make sure you discuss any questions you have with your health care provider.

## 2014-07-20 NOTE — Progress Notes (Signed)
cc'ed to pcp °

## 2014-07-27 ENCOUNTER — Emergency Department (HOSPITAL_COMMUNITY)
Admission: EM | Admit: 2014-07-27 | Discharge: 2014-07-28 | Disposition: A | Discharge status: Home / Self Care | Payer: Medicaid Other | Attending: Emergency Medicine | Admitting: Emergency Medicine

## 2014-07-27 ENCOUNTER — Encounter (HOSPITAL_COMMUNITY): Payer: Self-pay | Admitting: Emergency Medicine

## 2014-07-27 DIAGNOSIS — F209 Schizophrenia, unspecified: Secondary | ICD-10-CM | POA: Insufficient documentation

## 2014-07-27 DIAGNOSIS — F250 Schizoaffective disorder, bipolar type: Secondary | ICD-10-CM

## 2014-07-27 DIAGNOSIS — F29 Unspecified psychosis not due to a substance or known physiological condition: Secondary | ICD-10-CM | POA: Diagnosis present

## 2014-07-27 HISTORY — DX: Depression, unspecified: F32.A

## 2014-07-27 HISTORY — DX: Major depressive disorder, single episode, unspecified: F32.9

## 2014-07-27 LAB — CBC WITH DIFFERENTIAL/PLATELET
BASOS ABS: 0 10*3/uL (ref 0.0–0.1)
BASOS PCT: 0 % (ref 0–1)
Eosinophils Absolute: 0.2 10*3/uL (ref 0.0–0.7)
Eosinophils Relative: 4 % (ref 0–5)
HEMATOCRIT: 38.4 % — AB (ref 39.0–52.0)
HEMOGLOBIN: 13.2 g/dL (ref 13.0–17.0)
Lymphocytes Relative: 43 % (ref 12–46)
Lymphs Abs: 1.7 10*3/uL (ref 0.7–4.0)
MCH: 30.1 pg (ref 26.0–34.0)
MCHC: 34.4 g/dL (ref 30.0–36.0)
MCV: 87.7 fL (ref 78.0–100.0)
MONOS PCT: 10 % (ref 3–12)
Monocytes Absolute: 0.4 10*3/uL (ref 0.1–1.0)
NEUTROS ABS: 1.7 10*3/uL (ref 1.7–7.7)
Neutrophils Relative %: 43 % (ref 43–77)
Platelets: 184 10*3/uL (ref 150–400)
RBC: 4.38 MIL/uL (ref 4.22–5.81)
RDW: 14 % (ref 11.5–15.5)
WBC: 4 10*3/uL (ref 4.0–10.5)

## 2014-07-27 LAB — BASIC METABOLIC PANEL
Anion gap: 9 (ref 5–15)
BUN: 17 mg/dL (ref 6–23)
CHLORIDE: 100 meq/L (ref 96–112)
CO2: 30 meq/L (ref 19–32)
Calcium: 9.6 mg/dL (ref 8.4–10.5)
Creatinine, Ser: 1.1 mg/dL (ref 0.50–1.35)
GFR calc Af Amer: >90 mL/min (ref >90)
GFR calc non Af Amer: >90 mL/min (ref >90)
GLUCOSE: 88 mg/dL (ref 70–99)
Potassium: 4.8 mEq/L (ref 3.7–5.3)
SODIUM: 139 meq/L (ref 137–147)

## 2014-07-27 LAB — ETHANOL

## 2014-07-27 LAB — RAPID URINE DRUG SCREEN, HOSP PERFORMED
AMPHETAMINES: NOT DETECTED
Barbiturates: NOT DETECTED
Benzodiazepines: NOT DETECTED
COCAINE: NOT DETECTED
Opiates: NOT DETECTED
Tetrahydrocannabinol: NOT DETECTED

## 2014-07-27 NOTE — BH Assessment (Addendum)
Tele Assessment Note   David Blankenship is an 28 y.o. male that presents to [facility] ED today reporting auditory hallucinations with commands to harm himself.  Pt stated he tells the voices he is not going to hurt himself or anyone else, but needs help.  Pt stated he is seeing "circles around my head," (visual hallucinations) and reported he doesn't think his medications are working but that he is taking them as prescribed.  No delusions noted.  Pt denies SA reporting he has been sober since 01/2012.  Pt lives at [facility] (Assisted Living Facility) and reports staff there brought him to ED.  He goes to [facility] for his medications.  He has been hospitalized in the past for psychosis.  He was at [facility] last year presenting with similar sx.  It appears the pt has decompensated.  He denies depression or anxiety, and stated the only stress he currently has is being homesick.  Pt pleasant, calm, cooperative, oriented x 3, had logical/coherent thought processes, rapid and pressured speech, good eye contact.  Clinical information was gathered from [facility] @ 1344.  Inpatient treatment is recommended for stabilization of sx.  Consulted with Serena Colonel, NP @ [facility] @ 1458 who recommends inpatient treatment for the pt.  Updated EDP Cook @ [phone], who was in agreement with disposition.  TTS will seek placement for the pt.   Axis I: 314.01 Attention-deficit/hyperactivity disorder, Predominantly hyperactive/impulsive presentation, 295.70 Schizoaffective disorder, Bipolar type Axis II: Deferred Axis III:  Past Medical History  Diagnosis Date  . Asthma   . Seizures   . Bipolar 1 disorder   . Schizophrenia   . ADHD (attention deficit hyperactivity disorder)   . Depression    Axis IV: other psychosocial or environmental problems and problems with primary support group Axis V: 21-30 behavior considerably influenced by delusions or hallucinations OR serious impairment in judgment, communication OR inability to  function in almost all areas  Past Medical History:  Past Medical History  Diagnosis Date  . Asthma   . Seizures   . Bipolar 1 disorder   . Schizophrenia   . ADHD (attention deficit hyperactivity disorder)   . Depression     Past Surgical History  Procedure Laterality Date  . None    . No past surgeries    . Colonoscopy with esophagogastroduodenoscopy (egd)  10/28/2012    RUE:AVWUJW tiny distal esophageal erosions consistent with mild erosive reflux esophagitis. Hiatal hernia. 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 no h.pylori    Family History:  Family History  Problem Relation Age of Onset  . Seizures Father   . Asthma Other   . Seizures Other   . Cancer Other     aunt    Social History:  reports that he has been smoking Cigarettes.  He has a 6 pack-year smoking history. He does not have any smokeless tobacco history on file. He reports that he uses illicit drugs. He reports that he does not drink alcohol.  Additional Social History:  Alcohol / Drug Use Pain Medications: see med list Prescriptions: see med list Over the Counter: see med list History of alcohol / drug use?:  (None since 01/2012) Longest period of sobriety (when/how long): Sober since 01/2012 Withdrawal Symptoms:  (na)  CIWA: CIWA-Ar BP: 112/74 mmHg Pulse Rate: 66 COWS:    PATIENT STRENGTHS: (choose at least two) General fund of knowledge Motivation  for treatment/growth  Allergies:  Allergies  Allergen Reactions  . Acetaminophen Hives  . Bee Pollen Hives  . Shellfish Allergy Hives    Home Medications:  (Not in a hospital admission)  OB/GYN Status:  No LMP for male patient.  General Assessment Data Location of Assessment: [facility] Is this a Tele or Face-to-Face Assessment?: Tele Assessment Is this an Initial Assessment or a Re-assessment for this  encounter?: Initial Assessment Living Arrangements: Other (Comment) (Assisted Living Facility) Can pt return to current living arrangement?: Yes Admission Status: Voluntary Is patient capable of signing voluntary admission?: Yes Transfer from: Acute Hospital Referral Source: Self/Family/Friend     [facility] Living Arrangements: Other (Comment) (Assisted Living Facility) Name of Psychiatrist: [facility] Name of Therapist: none  Education Status Is patient currently in school?: No  Risk to self with the past 6 months Suicidal Ideation: Yes-Currently Present Suicidal Intent: Yes-Currently Present Is patient at risk for suicide?: Yes Suicidal Plan?: No Access to Means: No What has been your use of drugs/alcohol within the last 12 months?: pt has been sover since 01/2012 Previous Attempts/Gestures: Yes How many times?: 1 Other Self Harm Risks: na - pt denies Triggers for Past Attempts: Unpredictable Intentional Self Injurious Behavior: None Family Suicide History: No Recent stressful life event(s): Other (Comment) ([facility], SI) Persecutory voices/beliefs?: No Depression: No Depression Symptoms:  (pt denies) Substance abuse history and/or treatment for substance abuse?: Yes Suicide prevention information given to non-admitted patients: Not applicable  Risk to Others within the past 6 months Homicidal Ideation: No Thoughts of Harm to Others: No Current Homicidal Intent: No Current Homicidal Plan: No Access to Homicidal Means: No Identified Victim: na - pt denies History of harm to others?: No Assessment of Violence: None Noted Violent Behavior Description: na - pt calm, cooperative Does patient have access to weapons?: No Criminal Charges Pending?: No Does patient have a court date: No  Psychosis Hallucinations: Auditory;Visual;With command (reports seeing things, command hallucinations) Delusions: None noted  Mental Status Report Appear/Hygiene: In scrubs Eye  Contact: Good Motor Activity: Freedom of movement;Unremarkable Speech: Logical/coherent;Rapid;Pressured Level of Consciousness: Alert Mood: Preoccupied Affect: Appropriate to circumstance Anxiety Level: None Thought Processes: Coherent;Relevant Judgement: Impaired Orientation: Person;Place;Situation Obsessive Compulsive Thoughts/Behaviors: None  Cognitive Functioning Concentration: Normal Memory: Recent Intact;Remote Intact IQ: Average Insight: Fair Impulse Control: Fair Appetite: Good Weight Loss: 0 Weight Gain: 0 Sleep: No Change Total Hours of Sleep:  ("I sleep good.") Vegetative Symptoms: None  ADLScreening [facility] Patient's cognitive ability adequate to safely complete daily activities?: Yes Patient able to express need for assistance with ADLs?: Yes Independently performs ADLs?: Yes (appropriate for developmental age)  Prior Inpatient Therapy Prior Inpatient Therapy: Yes Prior Therapy Dates: 2012 and other unknown dates [facility] Reason for Treatment:  (psychosis)  Prior Outpatient Therapy Prior Outpatient Therapy: Yes Prior Therapy Dates: Current Prior Therapy Facilty/Provider(s): [facility] Reason for Treatment: med mgnt  ADL Screening (condition at time of admission) Patient's cognitive ability adequate to safely complete daily activities?: Yes Is the patient deaf or have difficulty hearing?: No Does the patient have difficulty seeing, even when wearing glasses/contacts?: No Does the patient have difficulty concentrating, remembering, or making decisions?: No Patient able to express need for assistance with ADLs?: Yes Does the patient have difficulty dressing or bathing?: No Independently performs ADLs?: Yes (appropriate for developmental age) Does the patient have difficulty walking or climbing stairs?: No  Home Assistive Devices/Equipment Home Assistive Devices/Equipment: None    Abuse/Neglect  Assessment (Assessment to be complete while patient is alone) Physical Abuse: Denies Verbal Abuse: Denies Sexual Abuse: Denies Exploitation of patient/patient's resources: Denies Self-Neglect: Denies Values / Beliefs Cultural Requests During Hospitalization: None Spiritual Requests During Hospitalization: None Consults Spiritual Care Consult Needed: No Social Work Consult Needed: No Merchant navy officer (For Healthcare) Does patient have an advance directive?: No Would patient like information on creating an advanced directive?: No - patient declined information    Additional Information 1:1 In Past 12 Months?: No CIRT Risk: No Elopement Risk: No Does patient have medical clearance?: Yes     Disposition:  Disposition Initial Assessment Completed for this Encounter: Yes Disposition of Patient: Referred to;Inpatient treatment program Type of inpatient treatment program: Adult  Casimer Lanius, MS, [facility] Licensed Professional Counselor Therapeutic Triage Specialist [facility] Phone: [phone] Fax: [phone]  07/27/2014 2:31 PM

## 2014-07-27 NOTE — ED Notes (Signed)
[facility] called and pt is appropriate for [facility]. Dr. Estell HarpinZammit [facility] with [place] [facility].

## 2014-07-27 NOTE — ED Notes (Signed)
MD at bedside. 

## 2014-07-27 NOTE — ED Notes (Addendum)
Pt states he has been having hallucinations and hearing voices telling him to hurt and kill himself. States this has been going on x 3 weeks. States, "I told my psychiatrist but he didn't believe me" Denies SI/HI thoughts at this time.

## 2014-07-27 NOTE — ED Notes (Signed)
[facility]

## 2014-07-27 NOTE — ED Notes (Signed)
TTS consulting with pt at this time.  

## 2014-07-27 NOTE — ED Provider Notes (Signed)
CSN: [id number]     Arrival date & time 07/27/14  1057 History  This chart was scribed for Donnetta HutchingBrian Abigail Marsiglia, MD, by Yevette EdwardsAngela Bracken, ED Scribe. This patient was seen in room APA17/APA17 and the patient's care was started at 11:42 AM.   None    Chief Complaint  Patient presents with  . V70.1    The history is provided by the patient. No language interpreter was used.   HPI Comments: Level V caveat for mental health disorder David Blankenship is a 28 y.o. male, with a h/o Bipolar I disorder and schizophrenia, who presents to the Emergency Department complaining of intermittent auditory hallucinations which tell him to kill himself and kill others. The pt states he talks to the voices telling them he will not hurt himself or others. He also endorses nightmares. The pt requests additional help stating the treatment he is currently receiving is not resolving his symptoms. He reports similar symptoms in January 2014; he was [facility] at [facility] for [facility]. He lives at an assisted living facility with [name].   Past Medical History  Diagnosis Date  . Asthma   . Seizures   . Bipolar 1 disorder   . Schizophrenia   . ADHD (attention deficit hyperactivity disorder)   . Depression    Past Surgical History  Procedure Laterality Date  . None    . No past surgeries    . Colonoscopy with esophagogastroduodenoscopy (egd)  10/28/2012    WGN:FAOZHYRMR:Single tiny distal esophageal erosions consistent with mild erosive reflux esophagitis. Hiatal hernia. 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 no h.pylori   Family History  Problem Relation Age of Onset  . Seizures Father   . Asthma Other   . Seizures Other   . Cancer Other     aunt   History  Substance Use Topics  . Smoking status: Current Every Day Smoker -- 0.50 packs/day for 12 years   Types: Cigarettes  . Smokeless tobacco: Not on file     Comment: 5 or 6 cigarettes daily  . Alcohol Use: No     Comment: Quit drinking alcoholic beverages. No etoh since 01/2012    Review of Systems  Unable to perform ROS: Psychiatric disorder    A complete 10 system review of systems was obtained, and all systems were negative except where indicated in the HPI and PE.    Allergies  Acetaminophen; Bee pollen; and Shellfish allergy  Home Medications   Prior to Admission medications   Medication Sig Start Date End Date Taking? Authorizing Provider  benztropine (COGENTIN) 0.5 MG tablet Take 0.5 mg by mouth 2 (two) times daily.   Yes Historical Provider, MD  buPROPion (WELLBUTRIN XL) 150 MG 24 hr tablet Take 1 tablet (150 mg total) by mouth every morning. 04/04/14  Yes Shuvon Rankin, NP  dicyclomine (BENTYL) 10 MG capsule Take 1 capsule (10 mg total) by mouth 4 (four) times daily -  before meals and at bedtime. 06/08/14  Yes Nira RetortAnna W Sams, NP  divalproex (DEPAKOTE) 500 MG DR tablet Take 2 tablets (1,000 mg total) by mouth at bedtime. 04/04/14  Yes Shuvon Rankin, NP  haloperidol decanoate (HALDOL DECANOATE) 100 MG/ML injection Inject 100 mg into the muscle every 28 (twenty-eight) days. [facility]   Yes Historical Provider, MD  ketoconazole (NIZORAL) 2 % cream Apply 1 application topically daily. Between toes  Yes Historical Provider, MD  pantoprazole (PROTONIX) 40 MG tablet Take 40 mg by mouth every morning.   Yes Historical Provider, MD  tamsulosin (FLOMAX) 0.4 MG CAPS capsule Take 0.4 mg by mouth 2 (two) times daily.   Yes Historical Provider, MD  traZODone (DESYREL) 50 MG tablet Take 50 mg by mouth at bedtime.   Yes Historical Provider, MD  naproxen (NAPROSYN) 500 MG tablet Take 500 mg by mouth 2 (two) times daily as needed. pain    Historical Provider, MD   Triage Vitals: BP 112/74  Pulse 66  Temp(Src) 97.7 F (36.5 C) (Oral)  Resp 18  Ht 6\' 1"  (1.854 m)  Wt 150 lb (68.04 kg)   BMI 19.79 kg/m2  SpO2 100%  Physical Exam  Nursing note and vitals reviewed. Constitutional: He is oriented to person, place, and time. He appears well-developed and well-nourished.  HENT:  Head: Normocephalic and atraumatic.  Eyes: Conjunctivae and EOM are normal. Pupils are equal, round, and reactive to light.  Neck: Normal range of motion. Neck supple.  Cardiovascular: Normal rate, regular rhythm and normal heart sounds.   Pulmonary/Chest: Effort normal and breath sounds normal.  Abdominal: Soft. Bowel sounds are normal.  Musculoskeletal: Normal range of motion.  Neurological: He is alert and oriented to person, place, and time.  Skin: Skin is warm and dry.  Psychiatric:  Speech pressured. No obvious hallucinations.     ED Course  Procedures (including critical care time)  DIAGNOSTIC STUDIES: Oxygen Saturation is 100% on room air, normal by my interpretation.    COORDINATION OF CARE:  11:46 AM- Discussed treatment plan with patient, and the patient agreed to the plan. The plan includes lab work and a psych consult.   Labs Review Labs Reviewed  CBC WITH DIFFERENTIAL - Abnormal; Notable for the following:    HCT 38.4 (*)    All other components within normal limits  BASIC METABOLIC PANEL  ETHANOL  URINE RAPID DRUG SCREEN (HOSP PERFORMED)    Imaging Review No results found.   EKG Interpretation None      MDM   Final diagnoses:  Schizophrenia, unspecified type    Patient has long-standing psychiatric issues. He lives in assisted living. Will consult behavioral health.  I personally performed the services described in this documentation, which was scribed in my presence. The recorded information has been reviewed and is accurate.    Donnetta Hutching, MD 07/27/14 [id number]

## 2014-07-27 NOTE — ED Notes (Addendum)
[facility] at this time. AC made aware of pt d/c.

## 2014-07-27 NOTE — BH Assessment (Signed)
Inpt recommended, [facility] at capacity. Sent to the following with male beds available.  [facility]  Tashica Provencio, [place] Triage Specialist 07/27/2014 8:36 PM

## 2014-07-28 ENCOUNTER — Encounter (HOSPITAL_COMMUNITY): Payer: Self-pay | Admitting: Registered Nurse

## 2014-07-28 DIAGNOSIS — F259 Schizoaffective disorder, unspecified: Secondary | ICD-10-CM

## 2014-07-28 NOTE — BHH Counselor (Signed)
[facility] Pt is declined b/c they don't accept adult Medicaid. Writer passed along info to [facility].  Evette Cristalaroline Paige Mekesha Solomon, [place] Assessment Counselor

## 2014-07-28 NOTE — Discharge Instructions (Signed)
Schizophrenia °Schizophrenia is a mental illness. It may cause disturbed or disorganized thinking, speech, or behavior. People with schizophrenia have problems functioning in one or more areas of life: work, school, home, or relationships. People with schizophrenia are at increased risk for suicide, certain chronic physical illnesses, and unhealthy behaviors, such as smoking and drug use. °People who have family members with schizophrenia are at higher risk of developing the illness. Schizophrenia affects men and women equally but usually appears at an earlier age (teenage or early adult years) in men.  °SYMPTOMS °The earliest symptoms are often subtle (prodrome) and may go unnoticed until the illness becomes more severe (first-break psychosis). Symptoms of schizophrenia may be continuous or may come and go in severity. Episodes often are triggered by major life events, such as family stress, college, military service, marriage, pregnancy or child birth, divorce, or loss of a loved one. People with schizophrenia may see, hear, or feel things that do not exist (hallucinations). They may have false beliefs in spite of obvious proof to the contrary (delusions). Sometimes speech is incoherent or behavior is odd or withdrawn.  °DIAGNOSIS °Schizophrenia is diagnosed through an assessment by your caregiver. Your caregiver will ask questions about your thoughts, behavior, mood, and ability to function in daily life. Your caregiver may ask questions about your medical history and use of alcohol or drugs, including prescription medication. Your caregiver may also order blood tests and imaging exams. Certain medical conditions and substances can cause symptoms that resemble schizophrenia. Your caregiver may refer you to a mental health specialist for evaluation. There are three major criterion for a diagnosis of schizophrenia: °· Two or more of the following five symptoms are present for a month or longer: °¨ Delusions. Often  the delusions are that you are being attacked, harassed, cheated, persecuted or conspired against (persecutory delusions). °¨ Hallucinations.   °¨ Disorganized speech that does not make sense to others. °¨ Grossly disorganized (confused or unfocused) behavior or extremely overactive or underactive motor activity (catatonia). °¨ Negative symptoms such as bland or blunted emotions (flat affect), loss of will power (avolition), and withdrawal from social contacts (social isolation). °· Level of functioning in one or more major areas of life (work, school, relationships, or self-care) is markedly below the level of functioning before the onset of illness.   °· There are continuous signs of illness (either mild symptoms or decreased level of functioning) for at least 6 months or longer. °TREATMENT  °Schizophrenia is a long-term illness. It is best controlled with continuous treatment rather than treatment only when symptoms occur. The following treatments are used to manage schizophrenia: °· Medication--Medication is the most effective and important form of treatment for schizophrenia. Antipsychotic medications are usually prescribed to help manage schizophrenia. Other types of medication may be added to relieve any symptoms that may occur despite the use of antipsychotic medications. °· Counseling or talk therapy--Individual, group, or family counseling may be helpful in providing education, support, and guidance. Many people with schizophrenia also benefit from social skills and job skills (vocational) training. °A combination of medication and counseling is best for managing the disorder over time. A procedure in which electricity is applied to the brain through the scalp (electroconvulsive therapy) may be used to treat catatonic schizophrenia or schizophrenia in people who cannot take or do not respond to medication and counseling. °Document Released: 10/05/2000 Document Revised: 06/10/2013 Document Reviewed:  12/31/2012 °Exit[employer]® Patient Information ©2015 [employer]. This information is not intended to replace advice given to you by   your health care provider. Make sure you discuss any questions you have with your health care provider. ° °

## 2014-07-28 NOTE — Progress Notes (Addendum)
Pt psychiatrically stable for discharge back to group home. CSW confirmed with [facility] that patient will follow up with [facility]. CSW left message with [facility] to schedule a follow up appointment. Patient guardian is grandmother, however no answer and unable to leave a message. Patient provided with list of new group homes he and his grandmother can look into. [name] to pick up patient after 3pm, and will call when time is confirmed of pick up.   Byrd HesselbachKristen Jerriann Schrom, LCSW [phone]  ED CSW 07/28/2014 1333pm

## 2014-07-28 NOTE — ED Notes (Signed)
Pt present childlike smiling and saying that he has voices to hurt himself. When asked about voices, he responded "I ain't listening to them."  Pt reports that he wants help to find another place to live and that the hospital has helped him find housing in the past. Offered support and 15 minute checks. Safety maintained in the SAPPU.

## 2014-07-28 NOTE — ED Notes (Signed)
Pt d/c to his group home. All items returned.

## 2014-07-28 NOTE — ED Notes (Signed)
Pt to be discharged to group home.

## 2014-07-28 NOTE — Consult Note (Signed)
[facility] Face-to-Face Psychiatry Consult   Reason for Consult:  Making suicidal threats Referring Physician:  ER MD  David Blankenship is an 28 y.o. male. Total Time spent with patient: 45 minutes  Assessment: AXIS I:  Schizoaffective Disorder AXIS II:  Deferred AXIS III:   Past Medical History  Diagnosis Date  . Asthma   . Seizures   . Bipolar 1 disorder   . Schizophrenia   . ADHD (attention deficit hyperactivity disorder)   . Depression    AXIS IV:  chronic mental illness AXIS V:  61-70 mild symptoms  Plan:  No evidence of imminent risk to self or others at present.    Subjective:   David Blankenship is a 28 y.o. male patient admitted with suicidal threats.  HPI:  David Blankenship does not want to be in the current group home.  He believes the hospital can help him find another place.  He says he hears voices but he always hears them all the time .  They tell him to kill himself but he ignores them.  He got on the phone to find another place to live and did find one.  Since then he has been happy and appropriate. HPI Elements:   Location:  hallucinating reportedly. Quality:  says the voices are telling him to kill himself. Severity:  does not pay attention to the voices, he says. Timing:  wants to be out of his group home. Duration:  recently worse for about a week. Context:  as above.  Past Psychiatric History: Past Medical History  Diagnosis Date  . Asthma   . Seizures   . Bipolar 1 disorder   . Schizophrenia   . ADHD (attention deficit hyperactivity disorder)   . Depression     reports that he has been smoking Cigarettes.  He has a 6 pack-year smoking history. He does not have any smokeless tobacco history on file. He reports that he uses illicit drugs. He reports that he does not drink alcohol. Family History  Problem Relation Age of Onset  . Seizures Father   . Asthma Other   . Seizures Other   . Cancer Other     aunt   Family History Substance Abuse:   (unknown) Family Supports:  (ASL) Living Arrangements: Other (Comment) ([place]) Can pt return to current living arrangement?: Yes Abuse/Neglect [facility]) Physical Abuse: Denies Verbal Abuse: Denies Sexual Abuse: Denies Allergies:   Allergies  Allergen Reactions  . Acetaminophen Hives  . Bee Pollen Hives  . Shellfish Allergy Hives    ACT Assessment Complete:  Yes:    Educational Status    Risk to Self: Risk to self with the past 6 months Suicidal Ideation: Yes-Currently Present Suicidal Intent: Yes-Currently Present Is patient at risk for suicide?: Yes Suicidal Plan?: No Access to Means: No What has been your use of drugs/alcohol within the last 12 months?: pt has been sover since 01/2012 Previous Attempts/Gestures: Yes How many times?: 1 Other Self Harm Risks: na - pt denies Triggers for Past Attempts: Unpredictable Intentional Self Injurious Behavior: None Family Suicide History: No Recent stressful life event(s): Other (Comment) (AVH, SI) Persecutory voices/beliefs?: No Depression: No Depression Symptoms:  (pt denies) Substance abuse history and/or treatment for substance abuse?: Yes Suicide prevention information given to non-admitted patients: Not applicable  Risk to Others: Risk to Others within the past 6 months Homicidal Ideation: No Thoughts of Harm to Others: No Current Homicidal Intent: No Current Homicidal Plan: No Access to Homicidal  Means: No Identified Victim: na - pt denies History of harm to others?: No Assessment of Violence: None Noted Violent Behavior Description: na - pt calm, cooperative Does patient have access to weapons?: No Criminal Charges Pending?: No Does patient have a court date: No  Abuse: Abuse/Neglect Assessment (Assessment to be complete while patient is alone) Physical Abuse: Denies Verbal Abuse: Denies Sexual Abuse: Denies Exploitation of patient/patient's resources: Denies Self-Neglect: Denies  Prior Inpatient  Therapy: Prior Inpatient Therapy Prior Inpatient Therapy: Yes Prior Therapy Dates: 2012 and other unknown dates Prior Therapy Facilty/Provider(s): [place], [place] Reason for Treatment:  (psychosis)  Prior Outpatient Therapy: Prior Outpatient Therapy Prior Outpatient Therapy: Yes Prior Therapy Dates: Current Prior Therapy Facilty/Provider(s): [facility] Reason for Treatment: med mgnt  Additional Information: Additional Information 1:1 In Past 12 Months?: No CIRT Risk: No Elopement Risk: No Does patient have medical clearance?: Yes                  Objective: Blood pressure 108/59, pulse 65, temperature 98.6 F (37 C), temperature source Oral, resp. rate 18, height $RemoveBe'6\' 1"'AxXHBlAhF$  (1.854 m), weight 68.04 kg (150 lb), SpO2 100.00%.Body mass index is 19.79 kg/(m^2). Results for orders placed during the hospital encounter of 07/27/14 (from the past 72 hour(s))  URINE RAPID DRUG SCREEN (HOSP PERFORMED)     Status: None   Collection Time    07/27/14 12:30 PM      Result Value Ref Range   Opiates NONE DETECTED  NONE DETECTED   Cocaine NONE DETECTED  NONE DETECTED   Benzodiazepines NONE DETECTED  NONE DETECTED   Amphetamines NONE DETECTED  NONE DETECTED   Tetrahydrocannabinol NONE DETECTED  NONE DETECTED   Barbiturates NONE DETECTED  NONE DETECTED   Comment:            DRUG SCREEN FOR MEDICAL PURPOSES     ONLY.  IF CONFIRMATION IS NEEDED     FOR ANY PURPOSE, NOTIFY LAB     WITHIN 5 DAYS.                LOWEST DETECTABLE LIMITS     FOR URINE DRUG SCREEN     Drug Class       Cutoff (ng/mL)     Amphetamine      1000     Barbiturate      200     Benzodiazepine   440     Tricyclics       347     Opiates          300     Cocaine          300     THC              50  BASIC METABOLIC PANEL     Status: None   Collection Time    07/27/14 12:39 PM      Result Value Ref Range   Sodium 139  137 - 147 mEq/L   Potassium 4.8  3.7 - 5.3 mEq/L   Chloride 100  96 - 112 mEq/L   CO2 30  19 - 32  mEq/L   Glucose, Bld 88  70 - 99 mg/dL   BUN 17  6 - 23 mg/dL   Creatinine, Ser 1.10  0.50 - 1.35 mg/dL   Calcium 9.6  8.4 - 10.5 mg/dL   GFR calc non Af Amer >90  >90 mL/min   GFR calc Af Amer >90  >90 mL/min   Comment: (  NOTE)     The eGFR has been calculated using the CKD EPI equation.     This calculation has not been validated in all clinical situations.     eGFR's persistently <90 mL/min signify possible Chronic Kidney     Disease.   Anion gap 9  5 - 15  CBC WITH DIFFERENTIAL     Status: Abnormal   Collection Time    07/27/14 12:39 PM      Result Value Ref Range   WBC 4.0  4.0 - 10.5 K/uL   RBC 4.38  4.22 - 5.81 MIL/uL   Hemoglobin 13.2  13.0 - 17.0 g/dL   HCT 38.4 (*) 39.0 - 52.0 %   MCV 87.7  78.0 - 100.0 fL   MCH 30.1  26.0 - 34.0 pg   MCHC 34.4  30.0 - 36.0 g/dL   RDW 14.0  11.5 - 15.5 %   Platelets 184  150 - 400 K/uL   Neutrophils Relative % 43  43 - 77 %   Lymphocytes Relative 43  12 - 46 %   Monocytes Relative 10  3 - 12 %   Eosinophils Relative 4  0 - 5 %   Basophils Relative 0  0 - 1 %   Neutro Abs 1.7  1.7 - 7.7 K/uL   Lymphs Abs 1.7  0.7 - 4.0 K/uL   Monocytes Absolute 0.4  0.1 - 1.0 K/uL   Eosinophils Absolute 0.2  0.0 - 0.7 K/uL   Basophils Absolute 0.0  0.0 - 0.1 K/uL   WBC Morphology ATYPICAL LYMPHOCYTES     Smear Review LARGE PLATELETS PRESENT    ETHANOL     Status: None   Collection Time    07/27/14 12:39 PM      Result Value Ref Range   Alcohol, Ethyl (B) <11  0 - 11 mg/dL   Comment:            LOWEST DETECTABLE LIMIT FOR     SERUM ALCOHOL IS 11 mg/dL     FOR MEDICAL PURPOSES ONLY   Labs are reviewed and are pertinent for no psychiatric issues.  No current facility-administered medications for this encounter.   Current Outpatient Prescriptions  Medication Sig Dispense Refill  . benztropine (COGENTIN) 0.5 MG tablet Take 0.5 mg by mouth 2 (two) times daily.      Marland Kitchen buPROPion (WELLBUTRIN XL) 150 MG 24 hr tablet Take 1 tablet (150 mg total) by  mouth every morning.  30 tablet  1  . dicyclomine (BENTYL) 10 MG capsule Take 1 capsule (10 mg total) by mouth 4 (four) times daily -  before meals and at bedtime.  120 capsule  3  . divalproex (DEPAKOTE) 500 MG DR tablet Take 2 tablets (1,000 mg total) by mouth at bedtime.  60 tablet  1  . haloperidol decanoate (HALDOL DECANOATE) 100 MG/ML injection Inject 100 mg into the muscle every 28 (twenty-eight) days. Given by [facility]      . ketoconazole (NIZORAL) 2 % cream Apply 1 application topically daily. Between toes      . pantoprazole (PROTONIX) 40 MG tablet Take 40 mg by mouth every morning.      . tamsulosin (FLOMAX) 0.4 MG CAPS capsule Take 0.4 mg by mouth 2 (two) times daily.      . traZODone (DESYREL) 50 MG tablet Take 50 mg by mouth at bedtime.      . naproxen (NAPROSYN) 500 MG tablet Take 500 mg by mouth 2 (  two) times daily as needed. pain        Psychiatric Specialty Exam:     Blood pressure 108/59, pulse 65, temperature 98.6 F (37 C), temperature source Oral, resp. rate 18, height $RemoveBe'6\' 1"'woWOoqAMH$  (1.854 m), weight 68.04 kg (150 lb), SpO2 100.00%.Body mass index is 19.79 kg/(m^2).  General Appearance: Casual  Eye Contact::  Good  Speech:  Clear and Coherent  Volume:  Normal  Mood:  Euthymic  Affect:  Appropriate  Thought Process:  Coherent  Orientation:  Full (Time, Place, and Person)  Thought Content:  Hallucinations: Auditory Command:  to kill himself but he says he ignores them  Suicidal Thoughts:  No  Homicidal Thoughts:  No  Memory:  Immediate;   Good Recent;   Good Remote;   Good  Judgement:  Fair  Insight:  Shallow  Psychomotor Activity:  Normal  Concentration:  Good  Recall:  Good  Fund of Knowledge:Good  Language: Good  Akathisia:  Negative  Handed:  Right  AIMS (if indicated):     Assets:  Communication Skills Desire for Improvement Housing Leisure Time Physical Health Social Support  Sleep:      Musculoskeletal: Strength & Muscle Tone: within normal  limits Gait & Station: normal Patient leans: N/A  Treatment Plan Summary: return to the group home to be followed outpatient. He does not plan to kill himself. He has found another group home if his guardian approves it.  Contrina Orona D 07/28/2014 2:32 PM

## 2014-07-28 NOTE — BHH Suicide Risk Assessment (Cosign Needed)
Suicide Risk Assessment  Discharge Assessment     Demographic Factors:  Male  Total Time spent with patient: 30 minutes  Psychiatric Specialty Exam:     Blood pressure 108/59, pulse 65, temperature 98.6 F (37 C), temperature source Oral, resp. rate 18, height 6\' 1"  (1.854 m), weight 68.04 kg (150 lb), SpO2 100.00%.Body mass index is 19.79 kg/(m^2).   General Appearance: Casual   Eye Contact:: Good   Speech: Clear and Coherent   Volume: Normal   Mood: Euthymic   Affect: Appropriate   Thought Process: Coherent   Orientation: Full (Time, Place, and Person)   Thought Content: Hallucinations: Auditory  Command: to kill himself but he says he ignores them   Suicidal Thoughts: No   Homicidal Thoughts: No   Memory: Immediate; Good  Recent; Good  Remote; Good   Judgement: Fair   Insight: Shallow   Psychomotor Activity: Normal   Concentration: Good   Recall: Good   Fund of Knowledge:Good   Language: Good   Akathisia: Negative   Handed: Right   AIMS (if indicated):   Assets: Communication Skills  Desire for Improvement  Housing  Leisure Time  Physical Health  Social Support   Sleep:   Musculoskeletal:  Strength & Muscle Tone: within normal limits  Gait & Station: normal  Patient leans: N/A    Mental Status Per Nursing Assessment::   On Admission:     Current Mental Status by Physician: Patient denies suicidal/homicidal ideation and paranoia  Loss Factors: NA  Historical Factors: NA  Risk Reduction Factors:   NA  Continued Clinical Symptoms:  Previous Psychiatric Diagnoses and Treatments  Cognitive Features That Contribute To Risk:  Loss of executive function    Suicide Risk:  Minimal: No identifiable suicidal ideation.  Patients presenting with no risk factors but with morbid ruminations; may be classified as minimal risk based on the severity of the depressive symptoms  Discharge Diagnoses: AXIS I: Schizoaffective Disorder  AXIS II: Deferred   AXIS III:  Past Medical History   Diagnosis  Date   .  Asthma    .  Seizures    .  Bipolar 1 disorder    .  Schizophrenia    .  ADHD (attention deficit hyperactivity disorder)    .  Depression     AXIS IV: chronic mental illness  AXIS V: 61-70 mild symptoms   Plan Of Care/Follow-up recommendations:  Activity:  as tolerated Diet:  as tolerated Other:  follow up with [facility]  Is patient on multiple antipsychotic therapies at discharge:  No   Has Patient had three or more failed trials of antipsychotic monotherapy by history:  No  Recommended Plan for Multiple Antipsychotic Therapies: NA    Rankin, Shuvon, FNP-BC 07/28/2014, 2:49 PM

## 2014-08-01 ENCOUNTER — Encounter (HOSPITAL_COMMUNITY): Payer: Self-pay | Admitting: Emergency Medicine

## 2014-08-01 ENCOUNTER — Emergency Department (HOSPITAL_COMMUNITY)
Admission: EM | Admit: 2014-08-01 | Discharge: 2014-08-02 | Disposition: A | Discharge status: Home / Self Care | Payer: Medicaid Other | Attending: Emergency Medicine | Admitting: Emergency Medicine

## 2014-08-01 DIAGNOSIS — F209 Schizophrenia, unspecified: Secondary | ICD-10-CM | POA: Insufficient documentation

## 2014-08-01 DIAGNOSIS — Z792 Long term (current) use of antibiotics: Secondary | ICD-10-CM | POA: Diagnosis not present

## 2014-08-01 DIAGNOSIS — Z720 Tobacco use: Secondary | ICD-10-CM | POA: Diagnosis not present

## 2014-08-01 DIAGNOSIS — F909 Attention-deficit hyperactivity disorder, unspecified type: Secondary | ICD-10-CM | POA: Insufficient documentation

## 2014-08-01 DIAGNOSIS — G40909 Epilepsy, unspecified, not intractable, without status epilepticus: Secondary | ICD-10-CM | POA: Diagnosis not present

## 2014-08-01 DIAGNOSIS — F319 Bipolar disorder, unspecified: Secondary | ICD-10-CM | POA: Diagnosis not present

## 2014-08-01 DIAGNOSIS — Z046 Encounter for general psychiatric examination, requested by authority: Secondary | ICD-10-CM | POA: Diagnosis present

## 2014-08-01 DIAGNOSIS — Z79899 Other long term (current) drug therapy: Secondary | ICD-10-CM | POA: Insufficient documentation

## 2014-08-01 DIAGNOSIS — J45909 Unspecified asthma, uncomplicated: Secondary | ICD-10-CM | POA: Insufficient documentation

## 2014-08-01 NOTE — ED Notes (Signed)
Pt states he left his group home around 2am Saturday morning and paid for a cab with his  "homie." Pt states his friend's dad brought him here because he is homeless. (Is what I am understanding from him.) Pt [place] [facility] the [employer] and wants to work at the [employer]. [facility]. Pt denies SI/HI or any auditory hallucinations.
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
## 2014-08-02 NOTE — ED Notes (Signed)
Social worker came to see pt. Social worker called [facility] who said they would accept pt back. Social worker informed pt of choice to go back to Group home or to leave with legal guardian who is his Grandmother. Pt still requesting to have cab to go to [place].

## 2014-08-02 NOTE — ED Provider Notes (Signed)
Patient has declined Best boysocial worker assistance. Social worker was unable to contact his guardian.  Per [place] Child psychotherapistsocial worker, there is no guardianship paperwork on file with the state. Patient appears to be his own guardian. D/w Renata Capriceonrad NP.  He states needs social work consult for placement options.  He denies any suicidal ideation or homicidal ideation. No hallucinations. He is alert and oriented x3. He appears to have capacity to make his decisions.  Advised patient he should return to his group home but he is not interested in doing that.    Glynn OctaveStephen Vella Colquitt, MD 08/02/14 [phone]

## 2014-08-02 NOTE — Clinical Social Work Note (Signed)
CSW met with pt this morning in ED. He states he called a cab and just left [facility] on Saturday. His friend brought him here last night. Pt states he does not want to return there because he [place] to be in the city. He reports his grandmother is his guardian. CSW attempted to reach her, but no voicemail or answer. Discussed with supervisor. CSW explained that pt should return to [facility] as [facility] Paganini was willing. Pt refuses and said he is going to leave and go to [place]. He said his grandmother cannot come get him. CSW called DSS and clerk of court. Per clerk of court's office, pt does not have guardian listed on file. Rise Paganini also said she does not have any paperwork for guardianship for pt. CSW notified RN in ED. Pt to d/c.  Benay Pike, [place]

## 2014-08-02 NOTE — ED Notes (Signed)
Pt moved from room 9 to room 4.

## 2014-08-02 NOTE — ED Notes (Signed)
Discharged patient to self care per MD, patient had no guardianship paperwork on file with [place]. Patient with no complaints at this time. Respirations even and unlabored. Skin warm/dry. Discharge instructions reviewed with patient at this time. Patient given opportunity to voice concerns/ask questions. Patient discharged at this time and left Emergency Department with steady gait.

## 2014-08-02 NOTE — ED Notes (Signed)
Pt up to shower with assistance of tech and security guard.

## 2014-08-02 NOTE — ED Notes (Signed)
Per [facility], patient needs social work consult about facility placement.

## 2014-08-02 NOTE — ED Notes (Signed)
Sitter at bedside. Patient asleep at this time 

## 2014-08-02 NOTE — ED Notes (Signed)
Pt becoming agitated stating,  " I am on my own. I don't need anymore." Dr. Manus Gunningancour aware of pt increase agitation. Will continue to monitor.

## 2014-08-02 NOTE — Discharge Instructions (Signed)
Schizophrenia °Schizophrenia is a mental illness. It may cause disturbed or disorganized thinking, speech, or behavior. People with schizophrenia have problems functioning in one or more areas of life: work, school, home, or relationships. People with schizophrenia are at increased risk for suicide, certain chronic physical illnesses, and unhealthy behaviors, such as smoking and drug use. °People who have family members with schizophrenia are at higher risk of developing the illness. Schizophrenia affects men and women equally but usually appears at an earlier age (teenage or early adult years) in men.  °SYMPTOMS °The earliest symptoms are often subtle (prodrome) and may go unnoticed until the illness becomes more severe (first-break psychosis). Symptoms of schizophrenia may be continuous or may come and go in severity. Episodes often are triggered by major life events, such as family stress, college, military service, marriage, pregnancy or child birth, divorce, or loss of a loved one. People with schizophrenia may see, hear, or feel things that do not exist (hallucinations). They may have false beliefs in spite of obvious proof to the contrary (delusions). Sometimes speech is incoherent or behavior is odd or withdrawn.  °DIAGNOSIS °Schizophrenia is diagnosed through an assessment by your caregiver. Your caregiver will ask questions about your thoughts, behavior, mood, and ability to function in daily life. Your caregiver may ask questions about your medical history and use of alcohol or drugs, including prescription medication. Your caregiver may also order blood tests and imaging exams. Certain medical conditions and substances can cause symptoms that resemble schizophrenia. Your caregiver may refer you to a mental health specialist for evaluation. There are three major criterion for a diagnosis of schizophrenia: °· Two or more of the following five symptoms are present for a month or longer: °¨ Delusions. Often  the delusions are that you are being attacked, harassed, cheated, persecuted or conspired against (persecutory delusions). °¨ Hallucinations.   °¨ Disorganized speech that does not make sense to others. °¨ Grossly disorganized (confused or unfocused) behavior or extremely overactive or underactive motor activity (catatonia). °¨ Negative symptoms such as bland or blunted emotions (flat affect), loss of will power (avolition), and withdrawal from social contacts (social isolation). °· Level of functioning in one or more major areas of life (work, school, relationships, or self-care) is markedly below the level of functioning before the onset of illness.   °· There are continuous signs of illness (either mild symptoms or decreased level of functioning) for at least 6 months or longer. °TREATMENT  °Schizophrenia is a long-term illness. It is best controlled with continuous treatment rather than treatment only when symptoms occur. The following treatments are used to manage schizophrenia: °· Medication--Medication is the most effective and important form of treatment for schizophrenia. Antipsychotic medications are usually prescribed to help manage schizophrenia. Other types of medication may be added to relieve any symptoms that may occur despite the use of antipsychotic medications. °· Counseling or talk therapy--Individual, group, or family counseling may be helpful in providing education, support, and guidance. Many people with schizophrenia also benefit from social skills and job skills (vocational) training. °A combination of medication and counseling is best for managing the disorder over time. A procedure in which electricity is applied to the brain through the scalp (electroconvulsive therapy) may be used to treat catatonic schizophrenia or schizophrenia in people who cannot take or do not respond to medication and counseling. °Document Released: 10/05/2000 Document Revised: 06/10/2013 Document Reviewed:  12/31/2012 °Exit[employer]® Patient Information ©2015 [employer]. This information is not intended to replace advice given to you by   your health care provider. Make sure you discuss any questions you have with your health care provider. ° °

## 2014-08-02 NOTE — ED Notes (Signed)
Spoke with Diannia RuderKara, Child psychotherapistocial worker, who will come to see the Pt.

## 2014-08-02 NOTE — ED Notes (Signed)
Called [facility]. Patient signed himself out yesterday with his grandmother's permission so he is no longer a resident there.
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
## 2014-08-02 NOTE — ED Notes (Signed)
Spoke with [facility]al [facility]out[facility]everly [facility]. Per Meriam SpragueBeverly she spoke with Grandmother ( pt legal guardian) who informed Meriam SpragueBeverly that " I am done. I am not guardian anymore. He is on his own." Informed Meriam SpragueBeverly that Grandmother still legally has guardianship until she releases her rights. Meriam SpragueBeverly will try to attempt to reach grandmother again,.

## 2014-08-23 ENCOUNTER — Other Ambulatory Visit (HOSPITAL_COMMUNITY): Payer: Self-pay | Admitting: Psychiatry

## 2014-08-27 NOTE — H&P (Signed)
  NTS SOAP Note  Vital Signs:  Vitals as of: 08/26/2014: Systolic 114: Diastolic 60: Heart Rate 79: Temp 98.21F: Height 256ft 1in: Weight 150Lbs 0 Ounces: Pain Level 5: BMI 19.79  BMI : 19.79 kg/m2  Subjective: This 28 year old male presents for of right upper quadrant abdominal pain and nausea.  Made worse with fatty food.  No fever,  chills,  jaundice.  Has had multpile u/s which are negative.  Latest HIDA scan showed normal ejection fraction,  but patient states symptoms reproducible with CCK injection.  Lives in group home.  Review of Symptoms:  Constitutional:unremarkable   Head:unremarkable Eyes:unremarkable   sinus Cardiovascular:  unremarkable Respiratory:cough Gastrointestinabdominal pain, nausea Genitourinary:unremarkable   joint and back pain Skin:unremarkable Hematolgic/Lymphatic:unremarkable   Allergic/Immunologic:unremarkable   Past Medical History:  Reviewed  Past Medical History  Surgical History: back surgery Medical Problems: schizophrenia ,  bipolar,  seizure Allergies: acetominophen,  shellfish Medications: cogentin,  wellbutrin,  depakote,  haldol,  linzess,  protonix,  flomax,  desyrel   Social History:Reviewed  Social History  Preferred Language: English Race:  Black or African American Ethnicity: Not Hispanic / Latino Age: 28 year Marital Status:  S Alcohol: no   Smoking Status: Current every day smoker reviewed on 08/26/2014 Started Date:  Packs per day: 1.00 Functional Status reviewed on 08/26/2014 ------------------------------------------------ Bathing: Normal Cooking: Normal Dressing: Normal Driving: Normal Eating: Normal Managing Meds: Normal Oral Care: Normal Shopping: Normal Toileting: Normal Transferring: Normal Walking: Normal Cognitive Status reviewed on 08/26/2014 ------------------------------------------------ Attention: Normal Decision Making: Normal Language: Normal Memory: Normal Motor:  Normal Perception: Normal Problem Solving: Normal Visual and Spatial: Normal   Family History:Reviewed  Family Health History Mother  Father, Living; Seizure disorder or epilepsy;     Objective Information: General:Well appearing, well nourished in no distress. Neck:Supple without lymphadenopathy.  Heart:RRR, no murmur or gallop.  Normal S1, S2.  No S3, S4.  Lungs:  CTA bilaterally, no wheezes, rhonchi, rales.  Breathing unlabored. Abdomen:Soft, NT/ND, no HSM, no masses.  Assessment:Chronic cholecystitis  Diagnoses: 575.11  K81.1 Chronic cholecystitis (Chronic cholecystitis)  Procedures: [id number] - OFFICE OUTPATIENT NEW 30 MINUTES    Plan:  Scheduled for laparoscopic cholecystectomy on 09/06/14.   Patient Education:Alternative treatments to surgery were discussed with patient (and family).  Risks and benefits  of procedure including bleeding,  infection,  hepatobiliary injury,  recurrence of the symptoms,  and the possibility of an open procedure were fully explained to the patient (and family) who gave informed consent. Patient/family questions were addressed.  Follow-up:Pending Surgery

## 2014-08-31 NOTE — Patient Instructions (Addendum)
David Blankenship  08/31/2014   Your procedure is scheduled on:  09/06/14  [facility].  Call this number if you have problems the morning of surgery: [phone]   Remember:   Do not eat food or drink liquids after midnight.   Take these medicines the morning of surgery with A SIP OF WATER: Wellbutrin, Bentyl, Depakote, Protonix, Flomax   Do not wear jewelry, make-up or nail polish.  Do not wear lotions, powders, or perfumes. You may wear deodorant.  Do not shave 48 hours prior to surgery. Men may shave face and neck.  Do not bring valuables to the hospital.  [facility] is not responsible for any belongings or valuables.               Contacts, dentures or bridgework may not be worn into surgery.  Leave suitcase in the car. After surgery it may be brought to your room.  For patients admitted to the hospital, discharge time is determined by your treatment team.               Patients discharged the day of surgery will not be allowed to drive home.  Name and phone number of your driver:   Special Instructions: Shower using CHG 2 nights before surgery and the night before surgery.  If you shower the day of surgery use CHG.  Use special wash - you have one bottle of CHG for all showers.  You should use approximately 1/3 of the bottle for each shower.   Please read over the following fact sheets that you were given: Surgical Site Infection Prevention and Anesthesia Post-op Instructions   PATIENT INSTRUCTIONS POST-ANESTHESIA  IMMEDIATELY FOLLOWING SURGERY:  Do not drive or operate machinery for the first twenty four hours after surgery.  Do not make any important decisions for twenty four hours after surgery or while taking narcotic pain medications or sedatives.  If you develop intractable nausea and vomiting or a severe headache please notify your doctor immediately.  FOLLOW-UP:  Please make an appointment with your surgeon as instructed. You do not need to  follow up with anesthesia unless specifically instructed to do so.  WOUND CARE INSTRUCTIONS (if applicable):  Keep a dry clean dressing on the anesthesia/puncture wound site if there is drainage.  Once the wound has quit draining you may leave it open to air.  Generally you should leave the bandage intact for twenty four hours unless there is drainage.  If the epidural site drains for more than 36-48 hours please call the anesthesia department.  QUESTIONS?:  Please feel free to call your physician or the hospital operator if you have any questions, and they will be happy to assist you.      Laparoscopic Cholecystectomy Laparoscopic cholecystectomy is surgery to remove the gallbladder. The gallbladder is located in the upper right part of the abdomen, behind the liver. It is a storage sac for bile produced in the liver. Bile aids in the digestion and absorption of fats. Cholecystectomy is often done for inflammation of the gallbladder (cholecystitis). This condition is usually caused by a buildup of gallstones (cholelithiasis) in your gallbladder. Gallstones can block the flow of bile, resulting in inflammation and pain. In severe cases, emergency surgery may be required. When emergency surgery is not required, you will have time to prepare for the procedure. Laparoscopic surgery is an alternative to open surgery. Laparoscopic surgery has a shorter recovery time. Your common bile duct may also  need to be examined during the procedure. If stones are found in the common bile duct, they may be removed. LET [facility]ER KNOW ABOUT:  Any allergies you have.  All medicines you are taking, including vitamins, herbs, eye drops, creams, and over-the-counter medicines.  Previous problems you or members of your family have had with the use of anesthetics.  Any blood disorders you have.  Previous surgeries you have had.  Medical conditions you have. RISKS AND COMPLICATIONS Generally, this is  a safe procedure. However, as with any procedure, complications can occur. Possible complications include:  Infection.  Damage to the common bile duct, nerves, arteries, veins, or other internal organs such as the stomach, liver, or intestines.  Bleeding.  A stone may remain in the common bile duct.  A bile leak from the cyst duct that is clipped when your gallbladder is removed.  The need to convert to open surgery, which requires a larger incision in the abdomen. This may be necessary if your surgeon thinks it is not safe to continue with a laparoscopic procedure. BEFORE THE PROCEDURE  Ask your health care provider about changing or stopping any regular medicines. You will need to stop taking aspirin or blood thinners at least 5 days prior to surgery.  Do not eat or drink anything after midnight the night before surgery.  Let your health care provider know if you develop a cold or other infectious problem before surgery. PROCEDURE   You will be given medicine to make you sleep through the procedure (general anesthetic). A breathing tube will be placed in your mouth.  When you are asleep, your surgeon will make several small cuts (incisions) in your abdomen.  A thin, lighted tube with a tiny camera on the end (laparoscope) is inserted through one of the small incisions. The camera on the laparoscope sends a picture to a TV screen in the operating room. This gives the surgeon a good view inside your abdomen.  A gas will be pumped into your abdomen. This expands your abdomen so that the surgeon has more room to perform the surgery.  Other tools needed for the procedure are inserted through the other incisions. The gallbladder is removed through one of the incisions.  After the removal of your gallbladder, the incisions will be closed with stitches, staples, or skin glue. AFTER THE PROCEDURE  You will be taken to a recovery area where your progress will be checked often.  You may  be allowed to go home the same day if your pain is controlled and you can tolerate liquids. Document Released: 10/08/2005 Document Revised: 07/29/2013 Document Reviewed: 05/20/2013 [facility] Patient Information 2015 [employer] [place]. This information is not intended to replace advice given to you by your health care provider. Make sure you discuss any questions you have with your health care provider.

## 2014-09-03 ENCOUNTER — Encounter (HOSPITAL_COMMUNITY): Payer: Self-pay

## 2014-09-03 ENCOUNTER — Encounter (HOSPITAL_COMMUNITY)
Admission: RE | Admit: 2014-09-03 | Discharge: 2014-09-03 | Disposition: A | Discharge status: Home / Self Care | Payer: Medicaid Other | Source: Ambulatory Visit | Attending: General Surgery | Admitting: General Surgery

## 2014-09-03 DIAGNOSIS — Z01812 Encounter for preprocedural laboratory examination: Secondary | ICD-10-CM | POA: Insufficient documentation

## 2014-09-03 LAB — BASIC METABOLIC PANEL
Anion gap: 10 (ref 5–15)
BUN: 17 mg/dL (ref 6–23)
CALCIUM: 9.6 mg/dL (ref 8.4–10.5)
CO2: 26 mEq/L (ref 19–32)
Chloride: 104 mEq/L (ref 96–112)
Creatinine, Ser: 1.03 mg/dL (ref 0.50–1.35)
GFR calc Af Amer: >90 mL/min (ref >90)
GFR calc non Af Amer: >90 mL/min (ref >90)
GLUCOSE: 76 mg/dL (ref 70–99)
Potassium: 4.7 mEq/L (ref 3.7–5.3)
SODIUM: 140 meq/L (ref 137–147)

## 2014-09-03 LAB — CBC WITH DIFFERENTIAL/PLATELET
BASOS ABS: 0 10*3/uL (ref 0.0–0.1)
BASOS PCT: 0 % (ref 0–1)
EOS ABS: 0.1 10*3/uL (ref 0.0–0.7)
EOS PCT: 2 % (ref 0–5)
HCT: 37.6 % — ABNORMAL LOW (ref 39.0–52.0)
Hemoglobin: 12.6 g/dL — ABNORMAL LOW (ref 13.0–17.0)
Lymphocytes Relative: 39 % (ref 12–46)
Lymphs Abs: 2.2 10*3/uL (ref 0.7–4.0)
MCH: 30.4 pg (ref 26.0–34.0)
MCHC: 33.5 g/dL (ref 30.0–36.0)
MCV: 90.6 fL (ref 78.0–100.0)
Monocytes Absolute: 0.5 10*3/uL (ref 0.1–1.0)
Monocytes Relative: 8 % (ref 3–12)
Neutro Abs: 2.9 10*3/uL (ref 1.7–7.7)
Neutrophils Relative %: 51 % (ref 43–77)
PLATELETS: 219 10*3/uL (ref 150–400)
RBC: 4.15 MIL/uL — ABNORMAL LOW (ref 4.22–5.81)
RDW: 14.9 % (ref 11.5–15.5)
WBC: 5.7 10*3/uL (ref 4.0–10.5)

## 2014-09-03 LAB — HEPATIC FUNCTION PANEL
ALBUMIN: 4.1 g/dL (ref 3.5–5.2)
ALT: 7 U/L (ref 0–53)
AST: 16 U/L (ref 0–37)
Alkaline Phosphatase: 45 U/L (ref 39–117)
Bilirubin, Direct: <0.2 mg/dL (ref 0.0–0.3)
Total Bilirubin: 0.4 mg/dL (ref 0.3–1.2)
Total Protein: 6.7 g/dL (ref 6.0–8.3)

## 2014-09-03 NOTE — Pre-Procedure Instructions (Signed)
Patient and caregiver, David Blankenship given information to sign up for my chart at home.

## 2014-09-06 ENCOUNTER — Encounter (HOSPITAL_COMMUNITY): Admission: RE | Payer: Self-pay | Source: Ambulatory Visit

## 2014-09-06 ENCOUNTER — Encounter (HOSPITAL_COMMUNITY): Payer: Self-pay | Admitting: Anesthesiology

## 2014-09-06 ENCOUNTER — Ambulatory Visit (HOSPITAL_COMMUNITY): Admission: RE | Admit: 2014-09-06 | Payer: Medicaid Other | Source: Ambulatory Visit | Admitting: General Surgery

## 2014-09-06 SURGERY — LAPAROSCOPIC CHOLECYSTECTOMY
Anesthesia: General

## 2014-10-19 ENCOUNTER — Telehealth: Payer: Self-pay | Admitting: Gastroenterology

## 2014-10-19 ENCOUNTER — Ambulatory Visit: Payer: Medicaid Other | Admitting: Gastroenterology

## 2014-10-19 NOTE — Telephone Encounter (Signed)
PATIENT NO LONGER IN GROUP HOME AND LISTED AS HOMELESS

## 2014-10-19 NOTE — Telephone Encounter (Signed)
Please send letter.

## 2014-10-19 NOTE — Telephone Encounter (Signed)
PATIENT WAS A NO SHOW 10/19/14

## 2014-10-28 ENCOUNTER — Other Ambulatory Visit: Payer: Self-pay | Admitting: Gastroenterology

## 2014-11-03 ENCOUNTER — Telehealth: Payer: Self-pay | Admitting: Internal Medicine

## 2014-11-03 NOTE — Telephone Encounter (Signed)
PATIENT NURSING HOME CALLED STATING THAT HE NEEDS A PRESCRIPTION DOR DICYCLOMINE 10MG ,  PLEASE CALL THEM AT [phone]

## 2014-11-04 ENCOUNTER — Telehealth: Payer: Self-pay | Admitting: Internal Medicine

## 2014-11-04 NOTE — Telephone Encounter (Signed)
I spoke with Jasmine DecemberSharon at [facility]. See other phone note.

## 2014-11-04 NOTE — Telephone Encounter (Signed)
I spoke with Jasmine DecemberSharon at [facility]- she wants to know if we still want the pt on dicyclomine or not. She said if we D/C the medication we need to fax them something to [phone]. If we do not want to D/C it, he will need a refill.  I asked her if the pt was taking it and she said she couldn't answer that question because half the time the pt is not at the facility. She said he comes and goes as he pleases and most of the time he is not there. He does take it when he is there. I informed her that he had an appt on 10/19/14 and did not come to that appt. She said he was out of the facility at that time. Routing to refill box

## 2014-11-04 NOTE — Telephone Encounter (Signed)
Lady from group home is calling back to speak with nurse. She said that she had left message yesterday and hasn't heard from anyone yet.

## 2014-11-08 ENCOUNTER — Encounter: Payer: Self-pay | Admitting: Internal Medicine

## 2014-11-08 MED ORDER — DICYCLOMINE HCL 10 MG PO CAPS: ORAL_CAPSULE | ORAL | Status: DC

## 2014-11-08 NOTE — Telephone Encounter (Signed)
Please schedule ov.  

## 2014-11-08 NOTE — Addendum Note (Signed)
Addended by: Nira RetortSAMS, Lin Glazier W on: 11/08/2014 01:13 PM   Modules accepted: Orders

## 2014-11-08 NOTE — Telephone Encounter (Signed)
MADE APPOINTMENT AND NOTIFIED THE CARE FACILITY.  THEY SAID HOPEFULLY THEY WOULD SEE HIM TO GIVE HIM THE APPOINTMENT.

## 2014-11-08 NOTE — Telephone Encounter (Signed)
I completed Bentyl prescription. We will need to see him prior to any further refills after this.

## 2014-11-27 ENCOUNTER — Encounter (HOSPITAL_COMMUNITY): Payer: Self-pay | Admitting: Emergency Medicine

## 2014-11-27 ENCOUNTER — Emergency Department (HOSPITAL_COMMUNITY)
Admission: EM | Admit: 2014-11-27 | Discharge: 2014-11-27 | Disposition: A | Discharge status: Home / Self Care | Payer: Medicaid Other | Attending: Emergency Medicine | Admitting: Emergency Medicine

## 2014-11-27 DIAGNOSIS — R300 Dysuria: Secondary | ICD-10-CM | POA: Insufficient documentation

## 2014-11-27 DIAGNOSIS — Z79899 Other long term (current) drug therapy: Secondary | ICD-10-CM | POA: Insufficient documentation

## 2014-11-27 DIAGNOSIS — R590 Localized enlarged lymph nodes: Secondary | ICD-10-CM

## 2014-11-27 DIAGNOSIS — Z792 Long term (current) use of antibiotics: Secondary | ICD-10-CM | POA: Diagnosis not present

## 2014-11-27 DIAGNOSIS — F319 Bipolar disorder, unspecified: Secondary | ICD-10-CM | POA: Insufficient documentation

## 2014-11-27 DIAGNOSIS — J45909 Unspecified asthma, uncomplicated: Secondary | ICD-10-CM | POA: Diagnosis not present

## 2014-11-27 DIAGNOSIS — F909 Attention-deficit hyperactivity disorder, unspecified type: Secondary | ICD-10-CM | POA: Insufficient documentation

## 2014-11-27 DIAGNOSIS — Z720 Tobacco use: Secondary | ICD-10-CM | POA: Diagnosis not present

## 2014-11-27 DIAGNOSIS — R591 Generalized enlarged lymph nodes: Secondary | ICD-10-CM | POA: Insufficient documentation

## 2014-11-27 DIAGNOSIS — F209 Schizophrenia, unspecified: Secondary | ICD-10-CM | POA: Diagnosis not present

## 2014-11-27 DIAGNOSIS — R369 Urethral discharge, unspecified: Secondary | ICD-10-CM | POA: Diagnosis present

## 2014-11-27 LAB — URINALYSIS, ROUTINE W REFLEX MICROSCOPIC
Bilirubin Urine: NEGATIVE
GLUCOSE, UA: NEGATIVE mg/dL
Ketones, ur: NEGATIVE mg/dL
NITRITE: NEGATIVE
Protein, ur: 30 mg/dL — AB
Urobilinogen, UA: 0.2 mg/dL (ref 0.0–1.0)
pH: 6 (ref 5.0–8.0)

## 2014-11-27 LAB — URINE MICROSCOPIC-ADD ON

## 2014-11-27 MED ORDER — CLOTRIMAZOLE 1 % EX CREA: TOPICAL_CREAM | CUTANEOUS | Status: DC

## 2014-11-27 MED ORDER — DOXYCYCLINE HYCLATE 100 MG PO CAPS: 100.0000 mg | ORAL_CAPSULE | Freq: Two times a day (BID) | ORAL | Status: DC

## 2014-11-27 MED ORDER — METRONIDAZOLE 500 MG PO TABS: 500.0000 mg | ORAL_TABLET | Freq: Two times a day (BID) | ORAL | Status: DC

## 2014-11-27 MED ORDER — AZITHROMYCIN 250 MG PO TABS
1000.0000 mg | ORAL_TABLET | Freq: Once | ORAL | Status: AC
  Administered 2014-11-27: 1000 mg via ORAL
  Filled 2014-11-27: qty 4

## 2014-11-27 MED ORDER — LIDOCAINE HCL (PF) 1 % IJ SOLN
INTRAMUSCULAR | Status: AC
  Administered 2014-11-27: 5 mL
  Filled 2014-11-27: qty 5

## 2014-11-27 MED ORDER — CEFTRIAXONE SODIUM 250 MG IJ SOLR
250.0000 mg | Freq: Once | INTRAMUSCULAR | Status: AC
  Administered 2014-11-27: 250 mg via INTRAMUSCULAR
  Filled 2014-11-27: qty 250

## 2014-11-27 NOTE — ED Notes (Signed)
Patient c/o reports thick yellow discharge with foul odor x1 month. Patient also reports burning sensation with urination. Denies any itching does report abscess in right groin. Per patient pain in penis with ambulating.

## 2014-11-27 NOTE — ED Provider Notes (Signed)
CSN: [id number]     Arrival date & time 11/27/14  1832 History   First MD Initiated Contact with Patient 11/27/14 1843     Chief Complaint  Patient presents with  . Penile Discharge     (Consider location/radiation/quality/duration/timing/severity/associated sxs/prior Treatment) Patient is a 29 y.o. male presenting with penile discharge. The history is provided by the patient.  Penile Discharge This is a new problem. The current episode started 1 to 4 weeks ago. The problem occurs constantly. The problem has been gradually worsening. Associated symptoms include a rash. He has tried nothing for the symptoms.   David Blankenship is a 29 y.o. male who presents to the ED with ureteral discharge that started about a month ago. He complains of dysuria and has what he thinks is an abscess to the right groin. Patient lives in a group home. He states he met a girl about a month ago and has had sex with her several times. He does not know if she has an STD but he thinks she gave him something. Patient here with staff member from the group home. Hx of mental health illnesses, asthma and seizures.   Past Medical History  Diagnosis Date  . Asthma   . Seizures   . Bipolar 1 disorder   . Schizophrenia   . ADHD (attention deficit hyperactivity disorder)   . Depression    Past Surgical History  Procedure Laterality Date  . None    . No past surgeries    . Colonoscopy with esophagogastroduodenoscopy (egd)  10/28/2012    BTD:HRCBUL tiny distal esophageal erosions consistent with mild erosive reflux esophagitis. Hiatal hernia. 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 no h.pylori   Family History  Problem Relation Age of Onset  . Seizures Father   . Asthma Other   . Seizures Other   . Cancer Other     aunt   History  Substance Use Topics  . Smoking  status: Current Every Day Smoker -- 0.50 packs/day for 12 years    Types: Cigarettes  . Smokeless tobacco: Not on file     Comment: 5 or 6 cigarettes daily  . Alcohol Use: 21.0 oz/week    35 Cans of beer per week     Comment: daily 5-6 beers    Review of Systems  Genitourinary: Positive for dysuria, urgency, frequency, discharge and penile pain.  Skin: Positive for rash.  Hematological: Positive for adenopathy.  Psychiatric/Behavioral: The patient is hyperactive.   all other systems negative.     Allergies  Acetaminophen; Bee pollen; and Shellfish allergy  Home Medications   Prior to Admission medications   Medication Sig Start Date End Date Taking? Authorizing Provider  buPROPion (WELLBUTRIN XL) 150 MG 24 hr tablet Take 1 tablet (150 mg total) by mouth every morning. 04/04/14   Shuvon Rankin, NP  clotrimazole (LOTRIMIN) 1 % cream Apply to affected area of your thighs 2 times daily 11/27/14   [facility], NP  clotrimazole-betamethasone (LOTRISONE) cream Apply 1 application topically 2 (two) times daily. Apply to groin area    Historical Provider, MD  dicyclomine (BENTYL) 10 MG capsule TAKE 1 CAPSULE BY MOUTH 4 TIMES DAILY WITH MEALS AND AT BEDTIME. As needed for abdominal cramping and loose stool. 11/08/14   Orvil Feil, NP  divalproex (DEPAKOTE) 500 MG DR tablet Take 2 tablets (1,000 mg total) by mouth at bedtime. 04/04/14  Shuvon Rankin, NP  doxycycline (VIBRAMYCIN) 100 MG capsule Take 1 capsule (100 mg total) by mouth 2 (two) times daily. 11/27/14   [place], NP  haloperidol decanoate (HALDOL DECANOATE) 100 MG/ML injection Inject 100 mg into the muscle every 28 (twenty-eight) days. Given by [facility]    Historical Provider, MD  ketoconazole (NIZORAL) 2 % cream Apply 1 application topically daily. Between toes    Historical Provider, MD  metroNIDAZOLE (FLAGYL) 500 MG tablet Take 1 tablet (500 mg total) by mouth 2 (two) times daily. 11/27/14   [place], NP  pantoprazole (PROTONIX)  40 MG tablet Take 40 mg by mouth every morning.    Historical Provider, MD  tamsulosin (FLOMAX) 0.4 MG CAPS capsule Take 0.4 mg by mouth 2 (two) times daily.    Historical Provider, MD  traZODone (DESYREL) 50 MG tablet Take 50 mg by mouth at bedtime.    Historical Provider, MD   BP 125/82 mmHg  Pulse 107  Temp(Src) 98.8 F (37.1 C) (Oral)  Resp 18  Ht _0  (1.854 m)  Wt 172 lb 2 oz (78.075 kg)  BMI 22.71 kg/m2  SpO2 98% Physical Exam  Constitutional: He is oriented to person, place, and time. He appears well-developed and well-nourished.  HENT:  Head: Normocephalic.  Eyes: EOM are normal.  Neck: Neck supple.  Cardiovascular: Normal rate.   Pulmonary/Chest: Effort normal.  Abdominal: Soft. There is no tenderness.  Genitourinary: Testes normal. Circumcised. Discharge found.  Yellow discharge noted from urethra. Bilateral tender enlarged lymph nodes inguinal area. Rash inner aspect of left thigh c/w tinea.   Musculoskeletal: Normal range of motion.  Neurological: He is alert and oriented to person, place, and time. No cranial nerve deficit.  Skin: Skin is warm and dry.  Psychiatric: He has a normal mood and affect. His behavior is normal.  Nursing note and vitals reviewed.   ED Course  Procedures  Cultures sent for GC, Chlamydia, blood drawn for HIV, RPR ,  Rocephin 250 mg IM, Zithromax 1 gram PO, Rx for doxycycline and Flagyl while cultures pending. Will call patient only for positive results.  MDM  29 y.o. male with possible STD exposure with dysuria and penile discharge for almost a month. Stable for discharge to follow up with the health department. Discussed with the patient and staff member from group home plan of care all questioned fully answered. BP 125/82 mmHg  Pulse 107  Temp(Src) 98.8 F (37.1 C) (Oral)  Resp 18  Ht _1  (1.854 m)  Wt 172 lb 2 oz (78.075 kg)  BMI 22.71 kg/m2  SpO2 98%  Final diagnoses:  Abnormal penile discharge  Dysuria  Inguinal  lymphadenopathy      Ashley Murrain, NP 11/27/14 1932  Richarda Blade, MD 11/27/14 [phone]

## 2014-11-27 NOTE — ED Notes (Signed)
Patient with no complaints at this time. Respirations even and unlabored. Skin warm/dry. Discharge instructions reviewed with patient at this time.Instructed patient no to have sex until all symptoms are gone.  Also instructed him to inform his partners that they need to be tested and treated as well.  Patient given opportunity to voice concerns/ask questions.  Patient discharged at this time and left Emergency Department with steady gait.

## 2014-11-29 ENCOUNTER — Other Ambulatory Visit (HOSPITAL_COMMUNITY): Payer: Self-pay | Admitting: Psychiatry

## 2014-11-29 LAB — RPR: RPR Ser Ql: NONREACTIVE

## 2014-11-29 LAB — GC/CHLAMYDIA PROBE AMP (~~LOC~~) NOT AT ARMC
Chlamydia: NEGATIVE
Neisseria Gonorrhea: POSITIVE — AB

## 2014-11-29 LAB — HIV ANTIBODY (ROUTINE TESTING W REFLEX): HIV Screen 4th Generation wRfx: NONREACTIVE

## 2014-11-30 NOTE — Telephone Encounter (Signed)
Denied refill request of patient's medication as he does not have an open record with [facility].

## 2014-12-01 ENCOUNTER — Telehealth (HOSPITAL_BASED_OUTPATIENT_CLINIC_OR_DEPARTMENT_OTHER): Payer: Self-pay | Admitting: Emergency Medicine

## 2014-12-07 ENCOUNTER — Ambulatory Visit (INDEPENDENT_AMBULATORY_CARE_PROVIDER_SITE_OTHER): Payer: Medicaid Other | Admitting: Gastroenterology

## 2014-12-07 ENCOUNTER — Telehealth: Payer: Self-pay

## 2014-12-07 ENCOUNTER — Encounter: Payer: Self-pay | Admitting: Gastroenterology

## 2014-12-07 VITALS — BP 114/61 | HR 74 | Temp 97.2°F | Ht 73.0 in | Wt 176.2 lb

## 2014-12-07 DIAGNOSIS — K589 Irritable bowel syndrome without diarrhea: Secondary | ICD-10-CM

## 2014-12-07 DIAGNOSIS — K2100 Gastro-esophageal reflux disease with esophagitis, without bleeding: Secondary | ICD-10-CM

## 2014-12-07 NOTE — Assessment & Plan Note (Signed)
Doing well with Bentyl prn, Miralax prn. As of note, abdominal pain resolved. Hold on elective cholecystectomy unless recurrent symptoms that are consistent with biliary etiology.

## 2014-12-07 NOTE — Assessment & Plan Note (Signed)
Controlled with Protonix once daily. Return in 6 months.

## 2014-12-07 NOTE — Progress Notes (Signed)
Referring Provider: Avon Gully, MD Primary Care Physician:  Avon Gully, MD Primary GI: Dr. Jena Gauss   Chief Complaint  Patient presents with  . Follow-up    HPI:   David Blankenship is a 29 y.o. male presenting today with a history of chronic abdominal pain, erosive reflux esophagitis, schizophrenia, prior work-up for gallbladder disease with normal [place], HIDA with excellent EF in the 90% range but notable reproduction of symptoms with CCK.    Did not have cholecystectomy. Got scared and didn't go. No abdominal pain, no N/V. No reflux. Denies constipation, diarrhea. Takes Bentyl TID. 1 BM daily.   Past Medical History  Diagnosis Date  . Asthma   . Seizures   . Bipolar 1 disorder   . Schizophrenia   . ADHD (attention deficit hyperactivity disorder)   . Depression     Past Surgical History  Procedure Laterality Date  . None    . No past surgeries    . Colonoscopy with esophagogastroduodenoscopy (egd)  10/28/2012    ZOX:WRUEAV tiny distal esophageal erosions consistent with mild erosive reflux esophagitis. Hiatal hernia. 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 no h.pylori    Current Outpatient Prescriptions  Medication Sig Dispense Refill  . buPROPion (WELLBUTRIN XL) 150 MG 24 hr tablet Take 1 tablet (150 mg total) by mouth every morning. 30 tablet 1  . clotrimazole (LOTRIMIN) 1 % cream Apply to affected area of your thighs 2 times daily 15 g 0  . clotrimazole-betamethasone (LOTRISONE) cream Apply 1 application topically 2 (two) times daily. Apply to groin area    . dicyclomine (BENTYL) 10 MG capsule TAKE 1 CAPSULE BY MOUTH 4 TIMES DAILY WITH MEALS AND AT BEDTIME. As needed for abdominal cramping and loose stool. 120 capsule 3  . divalproex (DEPAKOTE) 500 MG DR tablet Take 2 tablets (1,000 mg total) by mouth at bedtime. 60 tablet  1  . doxycycline (VIBRAMYCIN) 100 MG capsule Take 1 capsule (100 mg total) by mouth 2 (two) times daily. 20 capsule 0  . haloperidol decanoate (HALDOL DECANOATE) 100 MG/ML injection Inject 100 mg into the muscle every 28 (twenty-eight) days. Given by [facility]    . ketoconazole (NIZORAL) 2 % cream Apply 1 application topically daily. Between toes    . metroNIDAZOLE (FLAGYL) 500 MG tablet Take 1 tablet (500 mg total) by mouth 2 (two) times daily. 14 tablet 0  . pantoprazole (PROTONIX) 40 MG tablet Take 40 mg by mouth every morning.    . tamsulosin (FLOMAX) 0.4 MG CAPS capsule Take 0.4 mg by mouth 2 (two) times daily.    . traZODone (DESYREL) 50 MG tablet Take 50 mg by mouth at bedtime.     No current facility-administered medications for this visit.    Allergies as of 12/07/2014 - Review Complete 12/07/2014  Allergen Reaction Noted  . Acetaminophen Hives 12/25/2011  . Bee pollen Hives 08/19/2013  . Shellfish allergy Hives 12/25/2011    Family History  Problem Relation Age of Onset  . Seizures Father   . Asthma Other   . Seizures Other   . Cancer Other     aunt    History   Social History  . Marital Status: Single    Spouse Name: N/A  . Number of Children: N/A  . Years of Education: N/A   Social History Main Topics  . Smoking status: Current Every Day Smoker -- 0.50 packs/day for 12  years    Types: Cigarettes  . Smokeless tobacco: Not on file     Comment: 5 or 6 cigarettes daily  . Alcohol Use: 21.0 oz/week    35 Cans of beer per week     Comment: daily 5-6 beers   . Drug Use: Yes    Special: Marijuana, Cocaine     Comment: daily -last done 09/02/2014  . Sexual Activity: Yes    Birth Control/ Protection: Condom   Other Topics Concern  . None   Social History Narrative    Review of Systems: Negative as mentioned in HPI.   Physical Exam: BP 114/61 mmHg  Pulse 74  Temp(Src) 97.2 F (36.2 C)  Ht 6\' 1"  (1.854 m)  Wt 176 lb 3.2 oz (79.924 kg)  BMI 23.25  kg/m2 General:   Alert and oriented. No distress noted. Pleasant and cooperative.  Head:  Normocephalic and atraumatic. Eyes:  Conjuctiva clear without scleral icterus. Mouth:  Oral mucosa pink and moist. Good dentition. No lesions. Abdomen:  +BS, soft, non-tender and non-distended. No rebound or guarding. No HSM or masses noted. Msk:  Symmetrical without gross deformities. Normal posture. Extremities:  Without edema. Neurologic:  Alert and  oriented x4;  grossly normal neurologically. Skin:  Intact without significant lesions or rashes. Psych:  Alert and cooperative. Normal mood and affect.

## 2014-12-07 NOTE — Patient Instructions (Addendum)
Continue Protonix once each morning, 30 minutes before breakfast.   Take Bentyl with meals as needed up to three times a day for loose stool and abdominal pain. Do not take if you are constipated.   We will see you in 6 months!

## 2014-12-07 NOTE — Telephone Encounter (Signed)
Group home called- they need a new rx sent to [facility] for the bentyl please.

## 2014-12-08 MED ORDER — DICYCLOMINE HCL 10 MG PO CAPS: ORAL_CAPSULE | ORAL | Status: DC

## 2014-12-08 NOTE — Telephone Encounter (Signed)
Sent prescription electronically

## 2014-12-13 NOTE — Progress Notes (Signed)
cc'ed to pcp °

## 2015-03-01 ENCOUNTER — Other Ambulatory Visit (HOSPITAL_COMMUNITY): Payer: Self-pay | Admitting: Psychiatry

## 2015-03-25 ENCOUNTER — Other Ambulatory Visit (HOSPITAL_COMMUNITY): Payer: Self-pay | Admitting: Psychiatry

## 2015-06-07 ENCOUNTER — Ambulatory Visit: Payer: Medicaid Other | Admitting: Gastroenterology

## 2015-08-25 IMAGING — CR DG CHEST 2V
2 series · 2 of 2 positions shown · non-contrast
Comparison: December 31, 2005.

CLINICAL DATA: Chest pain.

EXAM:
CHEST  2 VIEW

[view not recorded (1 of 2)]
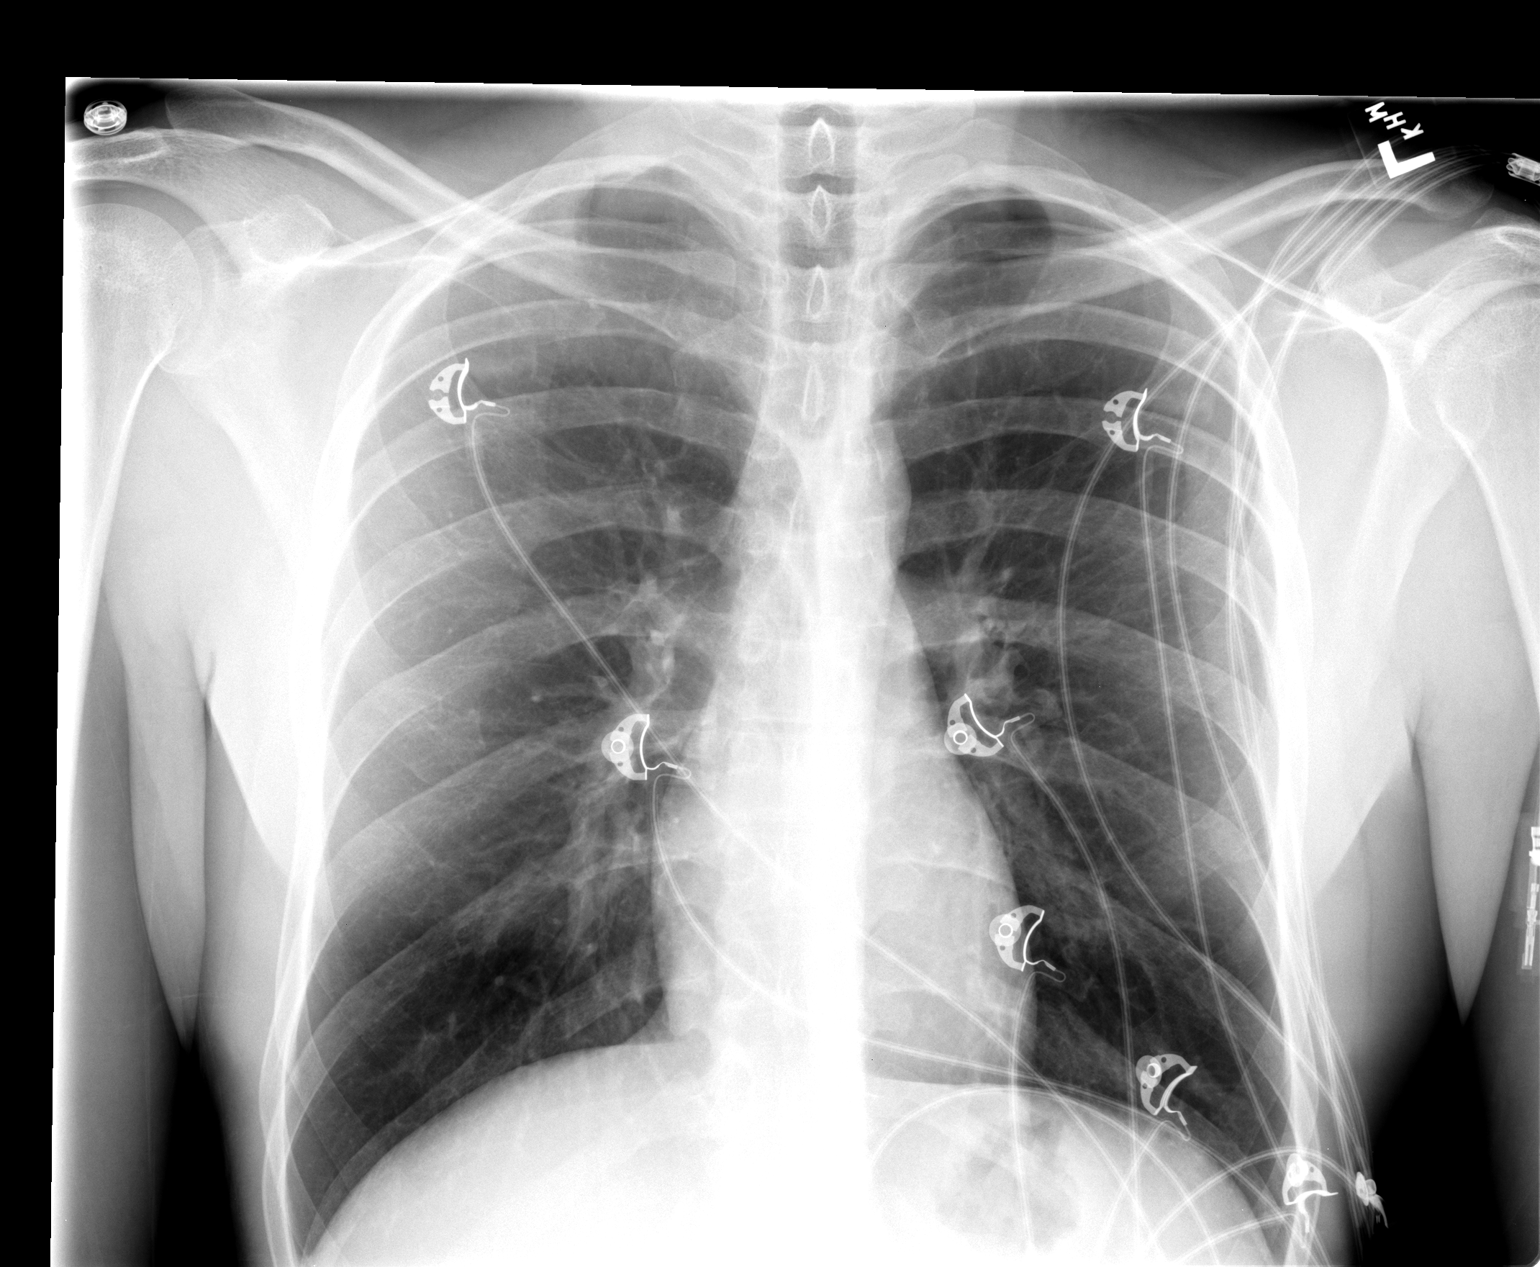

[view not recorded (2 of 2)]
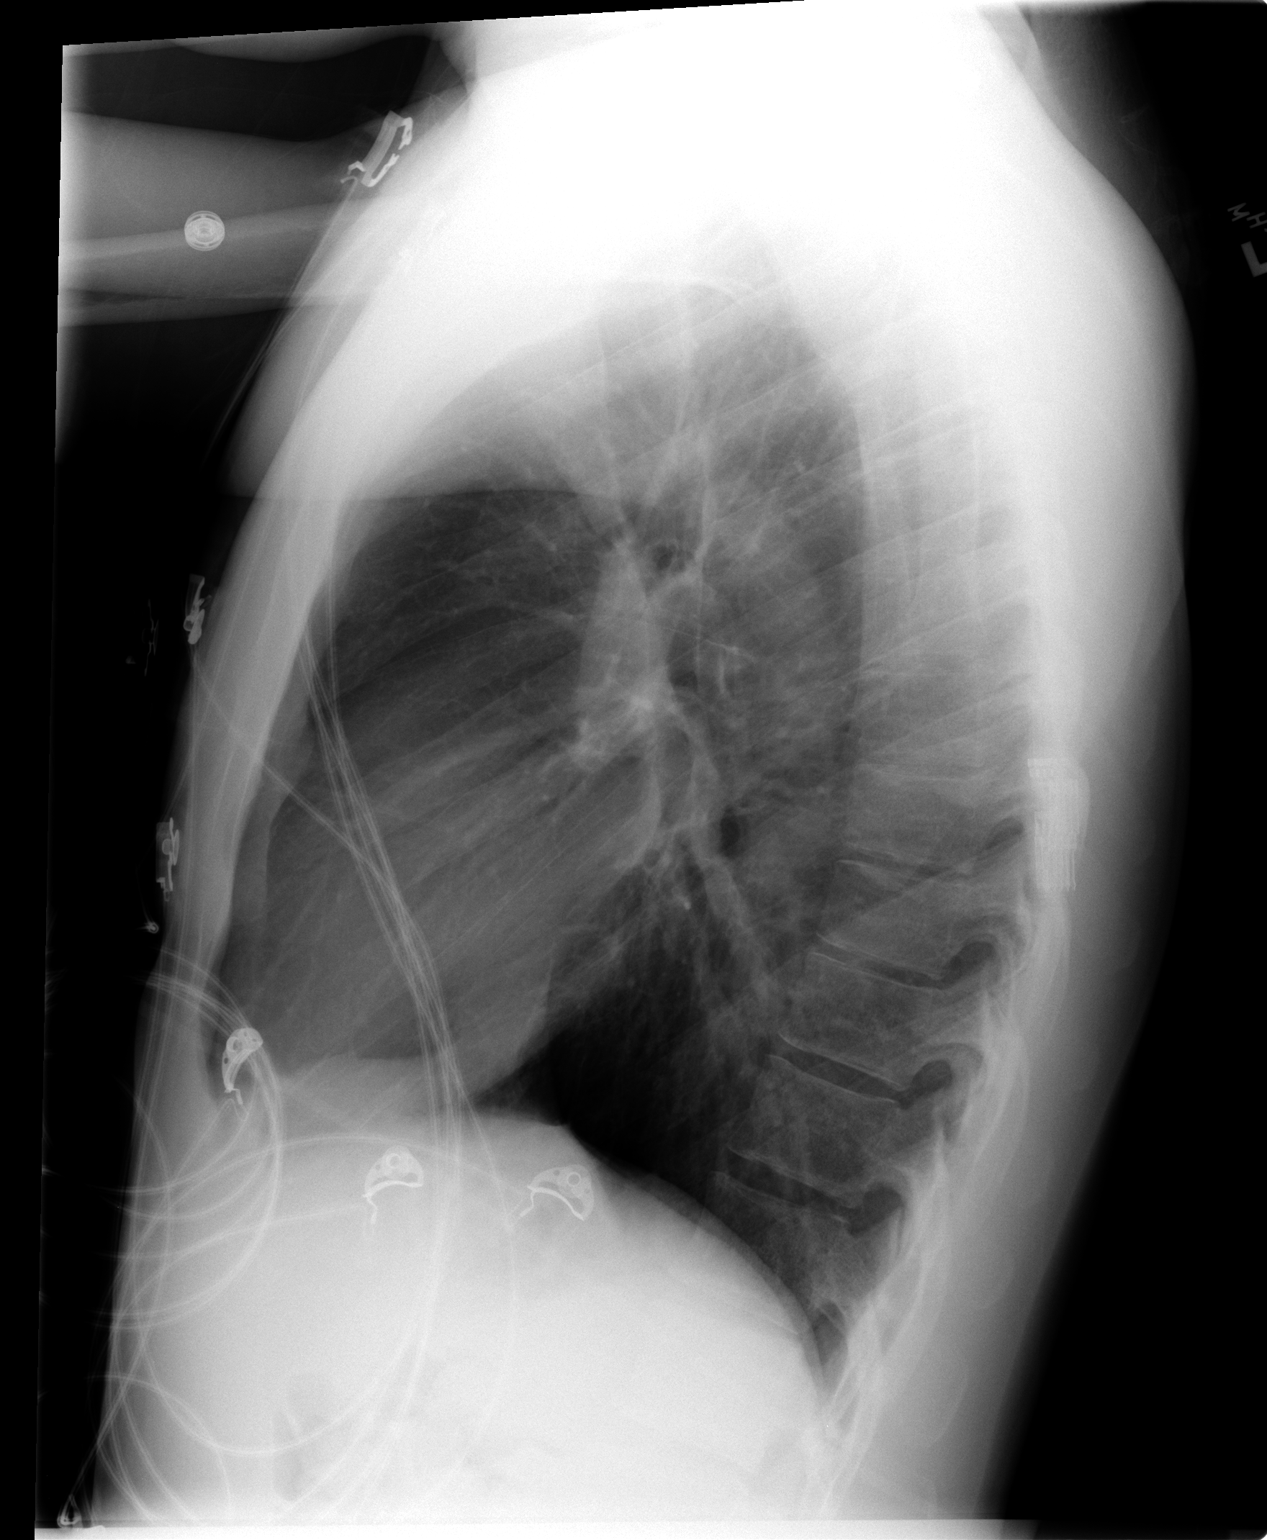

[2 of 2 positions shown; findings below may reference images not displayed]

FINDINGS: The heart size and mediastinal contours are within normal limits.
Both lungs are clear. No pneumothorax or pleural effusion is noted.
The visualized skeletal structures are unremarkable.
IMPRESSION: No acute cardiopulmonary abnormality seen.

## 2021-01-21 ENCOUNTER — Emergency Department (HOSPITAL_COMMUNITY)
Admission: EM | Admit: 2021-01-21 | Discharge: 2021-01-21 | Disposition: A | Discharge status: Home / Self Care | Payer: Medicaid Other | Attending: Emergency Medicine | Admitting: Emergency Medicine

## 2021-01-21 ENCOUNTER — Other Ambulatory Visit: Payer: Self-pay

## 2021-01-21 ENCOUNTER — Encounter (HOSPITAL_COMMUNITY): Payer: Self-pay

## 2021-01-21 DIAGNOSIS — F17210 Nicotine dependence, cigarettes, uncomplicated: Secondary | ICD-10-CM | POA: Diagnosis not present

## 2021-01-21 DIAGNOSIS — R1110 Vomiting, unspecified: Secondary | ICD-10-CM | POA: Insufficient documentation

## 2021-01-21 DIAGNOSIS — R109 Unspecified abdominal pain: Secondary | ICD-10-CM | POA: Diagnosis not present

## 2021-01-21 DIAGNOSIS — R1111 Vomiting without nausea: Secondary | ICD-10-CM

## 2021-01-21 DIAGNOSIS — G40909 Epilepsy, unspecified, not intractable, without status epilepticus: Secondary | ICD-10-CM

## 2021-01-21 DIAGNOSIS — J45909 Unspecified asthma, uncomplicated: Secondary | ICD-10-CM | POA: Insufficient documentation

## 2021-01-21 DIAGNOSIS — R569 Unspecified convulsions: Secondary | ICD-10-CM | POA: Insufficient documentation

## 2021-01-21 LAB — CBC WITH DIFFERENTIAL/PLATELET
Abs Immature Granulocytes: 0.05 10*3/uL (ref 0.00–0.07)
Basophils Absolute: 0 10*3/uL (ref 0.0–0.1)
Basophils Relative: 0 %
Eosinophils Absolute: 0.1 10*3/uL (ref 0.0–0.5)
Eosinophils Relative: 2 %
HCT: 40.5 % (ref 39.0–52.0)
Hemoglobin: 13.4 g/dL (ref 13.0–17.0)
Immature Granulocytes: 1 %
Lymphocytes Relative: 34 %
Lymphs Abs: 2.2 10*3/uL (ref 0.7–4.0)
MCH: 29.9 pg (ref 26.0–34.0)
MCHC: 33.1 g/dL (ref 30.0–36.0)
MCV: 90.4 fL (ref 80.0–100.0)
Monocytes Absolute: 0.6 10*3/uL (ref 0.1–1.0)
Monocytes Relative: 9 %
Neutro Abs: 3.4 10*3/uL (ref 1.7–7.7)
Neutrophils Relative %: 54 %
Platelets: 180 10*3/uL (ref 150–400)
RBC: 4.48 MIL/uL (ref 4.22–5.81)
RDW: 15.7 % — ABNORMAL HIGH (ref 11.5–15.5)
WBC: 6.4 10*3/uL (ref 4.0–10.5)
nRBC: 0 % (ref 0.0–0.2)

## 2021-01-21 LAB — VALPROIC ACID LEVEL: Valproic Acid Lvl: <10 ug/mL — ABNORMAL LOW (ref 50.0–100.0)

## 2021-01-21 LAB — COMPREHENSIVE METABOLIC PANEL
ALT: 18 U/L (ref 0–44)
AST: 37 U/L (ref 15–41)
Albumin: 4.2 g/dL (ref 3.5–5.0)
Alkaline Phosphatase: 37 U/L — ABNORMAL LOW (ref 38–126)
Anion gap: 8 (ref 5–15)
BUN: 20 mg/dL (ref 6–20)
CO2: 24 mmol/L (ref 22–32)
Calcium: 9.4 mg/dL (ref 8.9–10.3)
Chloride: 104 mmol/L (ref 98–111)
Creatinine, Ser: 0.92 mg/dL (ref 0.61–1.24)
GFR, Estimated: >60 mL/min (ref >60)
Glucose, Bld: 115 mg/dL — ABNORMAL HIGH (ref 70–99)
Potassium: 3.8 mmol/L (ref 3.5–5.1)
Sodium: 136 mmol/L (ref 135–145)
Total Bilirubin: 1.7 mg/dL — ABNORMAL HIGH (ref 0.3–1.2)
Total Protein: 7.3 g/dL (ref 6.5–8.1)

## 2021-01-21 LAB — LIPASE, BLOOD: Lipase: 37 U/L (ref 11–51)

## 2021-01-21 LAB — ETHANOL: Alcohol, Ethyl (B): <10 mg/dL (ref <10)

## 2021-01-21 MED ORDER — DIVALPROEX SODIUM 500 MG PO DR TAB
1000.0000 mg | DELAYED_RELEASE_TABLET | Freq: Once | ORAL | Status: AC
  Administered 2021-01-21: 1000 mg via ORAL
  Filled 2021-01-21: qty 2

## 2021-01-21 NOTE — ED Triage Notes (Signed)
Patient BIB [facility] from [employer]. Patient was on the ground vomiting, saying his stomach hurt. Patient has history of seizures. No incontinence or oral trauma noted.   EMS vitals HR 60 BP 150 palpated RR 14 O2 100%  CBG 113  20G Left Hand

## 2021-01-21 NOTE — ED Notes (Signed)
Asked patient to provide urine sample again. He says "I aint gotta go now."

## 2021-01-21 NOTE — ED Notes (Signed)
Seizure pads placed on side rails

## 2021-01-21 NOTE — Discharge Instructions (Signed)
Follow-up with your primary care doctor. Make sure to take your medications as directed. Return here for new concerns.

## 2021-01-21 NOTE — ED Notes (Signed)
Asked patient to provide urine sample. Patient unable to at this time.  

## 2021-01-21 NOTE — ED Provider Notes (Signed)
[facility]-EMERGENCY DEPT Provider Note   CSN: [id number] Arrival date & time: 01/21/21  0003     History Chief Complaint  Patient presents with  . Abdominal Pain  . Seizures  . Loss of Consciousness    David Blankenship is a 35 y.o. male.  The history is provided by the patient and medical records.   35 y.o. M with history of ADHD, bipolar disorder, schizophrenia, seizures, presenting to the ED with various complaints.  States he got some takeout from [employer] and with sitting down on the curb eating it when he reports he had a seizure but this was unwitnessed.  No bowel or bladder incontinence.  States afterwards he threw up and his stomach began hurting so bystander called 911.  States he is currently homeless.  He reports he has not taken his seizure medications in about 3 weeks.  Does report fairly frequent seizures.  He denies current abdominal pain, headache, dizziness, confusion.  Past Medical History:  Diagnosis Date  . ADHD (attention deficit hyperactivity disorder)   . Asthma   . Bipolar 1 disorder (HCC)   . Depression   . Schizophrenia (HCC)   . Seizures [facility])     Patient Active Problem List   Diagnosis Date Noted  . Abdominal pain, right upper quadrant 07/15/2014  . IBS (irritable bowel syndrome) 06/11/2014  . Unspecified constipation 04/20/2013  . Anemia 02/04/2013  . Thrombocytopenia, unspecified (HCC) 02/04/2013  . Gastro-esophageal reflux disease with esophagitis 02/04/2013  . Alcohol abuse 12/26/2011  . Cannabis abuse 12/26/2011  . Schizoaffective disorder, bipolar type (HCC) 12/25/2011    Past Surgical History:  Procedure Laterality Date  . COLONOSCOPY WITH ESOPHAGOGASTRODUODENOSCOPY (EGD)  10/28/2012   WVP:XTGGYI tiny distal esophageal erosions consistent with mild erosive reflux esophagitis. Hiatal hernia. 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 no h.pylori  . NO PAST SURGERIES    . none         Family History  Problem Relation Age of Onset  . Seizures Father   . Asthma Other   . Seizures Other   . Cancer Other        aunt    Social History   Tobacco Use  . Smoking status: Current Every Day Smoker    Packs/day: 0.50    Years: 12.00    Pack years: 6.00    Types: Cigarettes  . Tobacco comment: 5 or 6 cigarettes daily  Substance Use Topics  . Alcohol use: Yes    Alcohol/week: 35.0 standard drinks    Types: 35 Cans of beer per week    Comment: daily 5-6 beers   . Drug use: Yes    Types: Marijuana, Cocaine    Comment: daily -last done 09/02/2014    Home Medications Prior to Admission medications   Medication Sig Start Date End Date Taking? Authorizing Provider  buPROPion (WELLBUTRIN XL) 150 MG 24 hr tablet Take 1 tablet (150 mg total) by mouth every morning. 04/04/14   Rankin, Shuvon B, NP  clotrimazole (LOTRIMIN) 1 % cream Apply to affected area of your thighs 2 times daily 11/27/14   Kerrie Buffalo M, NP  clotrimazole-betamethasone (LOTRISONE) cream Apply 1 application topically 2 (two) times daily. Apply to groin area    [provider]  dicyclomine (BENTYL) 10 MG capsule TAKE 1 CAPSULE BY MOUTH 3 TIMES DAILY WITH MEALS  As needed for abdominal cramping and  loose stool. 12/08/14   Gelene Mink, NP  divalproex (DEPAKOTE) 500 MG DR tablet Take 2 tablets (1,000 mg total) by mouth at bedtime. 04/04/14   Rankin, Shuvon B, NP  doxycycline (VIBRAMYCIN) 100 MG capsule Take 1 capsule (100 mg total) by mouth 2 (two) times daily. 11/27/14   Janne Napoleon, NP  haloperidol decanoate (HALDOL DECANOATE) 100 MG/ML injection Inject 100 mg into the muscle every 28 (twenty-eight) days. Given by [facility]    [provider]  ketoconazole (NIZORAL) 2 % cream Apply 1 application topically daily. Between toes    [provider]  metroNIDAZOLE (FLAGYL)  500 MG tablet Take 1 tablet (500 mg total) by mouth 2 (two) times daily. 11/27/14   Janne Napoleon, NP  pantoprazole (PROTONIX) 40 MG tablet Take 40 mg by mouth every morning.    [provider]  tamsulosin (FLOMAX) 0.4 MG CAPS capsule Take 0.4 mg by mouth 2 (two) times daily.    [provider]  traZODone (DESYREL) 50 MG tablet Take 50 mg by mouth at bedtime.    [provider]    Allergies    Acetaminophen, Bee pollen, and Shellfish allergy  Review of Systems   Review of Systems  Gastrointestinal: Positive for abdominal pain and vomiting.  Neurological: Positive for seizures.  All other systems reviewed and are negative.   Physical Exam Updated Vital Signs BP 135/86 (BP Location: Right Arm)   Pulse 79   Temp 97.7 F (36.5 C) (Oral)   Resp (!) 21   SpO2 100%   Physical Exam Vitals and nursing note reviewed.  Constitutional:      Appearance: He is well-developed.  HENT:     Head: Normocephalic and atraumatic.     Comments: No visible head trauma    Mouth/Throat:     Comments: No tongue or dental trauma noted Eyes:     Conjunctiva/sclera: Conjunctivae normal.     Pupils: Pupils are equal, round, and reactive to light.  Cardiovascular:     Rate and Rhythm: Normal rate and regular rhythm.     Heart sounds: Normal heart sounds.  Pulmonary:     Effort: Pulmonary effort is normal.     Breath sounds: Normal breath sounds.  Abdominal:     General: Bowel sounds are normal.     Palpations: Abdomen is soft.     Tenderness: There is no abdominal tenderness. There is no guarding or rebound.     Comments: Soft, non-tender  Musculoskeletal:        General: Normal range of motion.     Cervical back: Normal range of motion.  Skin:    General: Skin is warm and dry.  Neurological:     Mental Status: He is alert and oriented to person, place, and time.     Comments: AAOx3, answering questions and following commands appropriately; equal strength UE and LE  bilaterally; CN grossly intact; moves all extremities appropriately without ataxia; no focal neuro deficits or facial asymmetry appreciated     ED Results / Procedures / Treatments   Labs (all labs ordered are listed, but only abnormal results are displayed) Labs Reviewed  CBC WITH DIFFERENTIAL/PLATELET - Abnormal; Notable for the following components:      Result Value   RDW 15.7 (*)    All other components within normal limits  VALPROIC ACID LEVEL - Abnormal; Notable for the following components:   Valproic Acid Lvl <10 (*)    All other components within normal  limits  COMPREHENSIVE METABOLIC PANEL - Abnormal; Notable for the following components:   Glucose, Bld 115 (*)    Alkaline Phosphatase 37 (*)    Total Bilirubin 1.7 (*)    All other components within normal limits  ETHANOL  LIPASE, BLOOD  RAPID URINE DRUG SCREEN, HOSP PERFORMED    EKG EKG Interpretation  Date/Time:  Saturday January 21 2021 00:43:26 EDT Ventricular Rate:  62 PR Interval:  159 QRS Duration: 76 QT Interval:  418 QTC Calculation: 425 R Axis:   87 Text Interpretation: Sinus rhythm LVH by voltage Early repolarization No significant change was found Confirmed by Paula Libra ([id number]) on 01/21/2021 12:47:23 AM   Radiology No results found.  Procedures Procedures   Medications Ordered in ED Medications  divalproex (DEPAKOTE) DR tablet 1,000 mg (1,000 mg Oral Given 01/21/21 0153)    ED Course  I have reviewed the triage vital signs and the nursing notes.  Pertinent labs & imaging results that were available during my care of the patient were reviewed by me and considered in my medical decision making (see chart for details).    MDM Rules/Calculators/A&P  35 year old male presenting to the ED with multiple complaints.  Reportedly was eating [employer] on a curb and had a unwitnessed seizure.  States afterwards he threw up and was experiencing abdominal pain so bystander called EMS.  Patient arrives  AAOx3 without focal deficits.  No signs of head trauma.  No bowel/bladder incontinence.  Abdomens soft, non-tender and denies current abdominal pain.  States he has not taken his seizure medication in about 3 weeks.  He is currently homeless.  EKG without acute findings.  Labs reassuring.  UDS pending.  3:02 AM Patient has been observed here for a few hours and given his nightly dose of depakote.  No further vomiting or seizure activity.  He has tolerated PO.  Remains oriented to baseline.  Feel he is stable for discharge.  Encouraged to take his medications as directed, follow-up with his primary care doctor.  Return here for new concerns.  Final Clinical Impression(s) / ED Diagnoses Final diagnoses:  Non-intractable vomiting without nausea, unspecified vomiting type  Seizure disorder [facility])    Rx / DC Orders ED Discharge Orders    None       Garlon Hatchet, PA-C 01/21/21 0343    Paula Libra, MD 01/21/21 [phone]

## 2021-03-11 ENCOUNTER — Emergency Department (HOSPITAL_COMMUNITY)
Admission: EM | Admit: 2021-03-11 | Discharge: 2021-03-11 | Disposition: A | Discharge status: Home / Self Care | Payer: Medicaid Other | Attending: Emergency Medicine | Admitting: Emergency Medicine

## 2021-03-11 ENCOUNTER — Other Ambulatory Visit: Payer: Self-pay

## 2021-03-11 DIAGNOSIS — Z20822 Contact with and (suspected) exposure to covid-19: Secondary | ICD-10-CM | POA: Diagnosis not present

## 2021-03-11 DIAGNOSIS — F17210 Nicotine dependence, cigarettes, uncomplicated: Secondary | ICD-10-CM | POA: Insufficient documentation

## 2021-03-11 DIAGNOSIS — R109 Unspecified abdominal pain: Secondary | ICD-10-CM | POA: Insufficient documentation

## 2021-03-11 DIAGNOSIS — R42 Dizziness and giddiness: Secondary | ICD-10-CM | POA: Diagnosis not present

## 2021-03-11 DIAGNOSIS — R110 Nausea: Secondary | ICD-10-CM | POA: Diagnosis not present

## 2021-03-11 DIAGNOSIS — J45909 Unspecified asthma, uncomplicated: Secondary | ICD-10-CM | POA: Insufficient documentation

## 2021-03-11 DIAGNOSIS — R52 Pain, unspecified: Secondary | ICD-10-CM

## 2021-03-11 LAB — COMPREHENSIVE METABOLIC PANEL
ALT: 12 U/L (ref 0–44)
AST: 18 U/L (ref 15–41)
Albumin: 4.5 g/dL (ref 3.5–5.0)
Alkaline Phosphatase: 40 U/L (ref 38–126)
Anion gap: 10 (ref 5–15)
BUN: 17 mg/dL (ref 6–20)
CO2: 22 mmol/L (ref 22–32)
Calcium: 9.6 mg/dL (ref 8.9–10.3)
Chloride: 102 mmol/L (ref 98–111)
Creatinine, Ser: 1.09 mg/dL (ref 0.61–1.24)
GFR, Estimated: >60 mL/min (ref >60)
Glucose, Bld: 85 mg/dL (ref 70–99)
Potassium: 3.6 mmol/L (ref 3.5–5.1)
Sodium: 134 mmol/L — ABNORMAL LOW (ref 135–145)
Total Bilirubin: 1.4 mg/dL — ABNORMAL HIGH (ref 0.3–1.2)
Total Protein: 7.4 g/dL (ref 6.5–8.1)

## 2021-03-11 LAB — CBC
HCT: 42.5 % (ref 39.0–52.0)
Hemoglobin: 14.1 g/dL (ref 13.0–17.0)
MCH: 29.4 pg (ref 26.0–34.0)
MCHC: 33.2 g/dL (ref 30.0–36.0)
MCV: 88.7 fL (ref 80.0–100.0)
Platelets: 202 10*3/uL (ref 150–400)
RBC: 4.79 MIL/uL (ref 4.22–5.81)
RDW: 13.5 % (ref 11.5–15.5)
WBC: 7.4 10*3/uL (ref 4.0–10.5)
nRBC: 0 % (ref 0.0–0.2)

## 2021-03-11 LAB — POC SARS CORONAVIRUS 2 AG -  ED: SARSCOV2ONAVIRUS 2 AG: NEGATIVE

## 2021-03-11 LAB — LIPASE, BLOOD: Lipase: 43 U/L (ref 11–51)

## 2021-03-11 MED ORDER — KETOROLAC TROMETHAMINE 30 MG/ML IJ SOLN
15.0000 mg | Freq: Once | INTRAMUSCULAR | Status: AC
Start: 2021-03-11 — End: 2021-03-11
  Administered 2021-03-11: 15 mg via INTRAVENOUS
  Filled 2021-03-11: qty 1

## 2021-03-11 MED ORDER — SODIUM CHLORIDE 0.9 % IV BOLUS
1000.0000 mL | Freq: Once | INTRAVENOUS | Status: AC
  Administered 2021-03-11: 1000 mL via INTRAVENOUS

## 2021-03-11 NOTE — ED Provider Notes (Signed)
[facility] EMERGENCY DEPARTMENT Provider Note   CSN: [id number] Arrival date & time: 03/11/21  0434     History Chief Complaint  Patient presents with  . Abdominal Pain    David Blankenship is a 35 y.o. male.  HPI Patient presents with multiple complaints.  He states that he has abdominal pain, nausea, dizziness, feels sore all over.  Onset unclear, but the patient states that he feels sore all over, uncomfortable.  It is unclear if he is taken any medication for relief. He does have a history of bipolar disorder, schizophrenia, seizures.  Though he cannot specify onset, characteristics, or true duration of his illness, he seemingly denies focal pain, denies recent seizures.  He is taking his medication as directed, reportedly.  He smokes, drinks, no clear alleviating or exacerbating factors.    Past Medical History:  Diagnosis Date  . ADHD (attention deficit hyperactivity disorder)   . Asthma   . Bipolar 1 disorder (HCC)   . Depression   . Schizophrenia (HCC)   . Seizures [facility])     Patient Active Problem List   Diagnosis Date Noted  . Abdominal pain, right upper quadrant 07/15/2014  . IBS (irritable bowel syndrome) 06/11/2014  . Unspecified constipation 04/20/2013  . Anemia 02/04/2013  . Thrombocytopenia, unspecified (HCC) 02/04/2013  . Gastro-esophageal reflux disease with esophagitis 02/04/2013  . Alcohol abuse 12/26/2011  . Cannabis abuse 12/26/2011  . Schizoaffective disorder, bipolar type (HCC) 12/25/2011    Past Surgical History:  Procedure Laterality Date  . COLONOSCOPY WITH ESOPHAGOGASTRODUODENOSCOPY (EGD)  10/28/2012   [id number] tiny distal esophageal erosions consistent with mild erosive reflux esophagitis. Hiatal hernia. 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 no h.pylori  . NO PAST SURGERIES     . none         Family History  Problem Relation Age of Onset  . Seizures Father   . Asthma Other   . Seizures Other   . Cancer Other        aunt    Social History   Tobacco Use  . Smoking status: Current Every Day Smoker    Packs/day: 0.50    Years: 12.00    Pack years: 6.00    Types: Cigarettes  . Tobacco comment: 5 or 6 cigarettes daily  Substance Use Topics  . Alcohol use: Yes    Alcohol/week: 35.0 standard drinks    Types: 35 Cans of beer per week    Comment: daily 5-6 beers   . Drug use: Yes    Types: Marijuana, Cocaine    Comment: daily -last done 09/02/2014    Home Medications Prior to Admission medications   Medication Sig Start Date End Date Taking? Authorizing Provider  buPROPion (WELLBUTRIN XL) 150 MG 24 hr tablet Take 1 tablet (150 mg total) by mouth every morning. Patient not taking: No sig reported 04/04/14   Rankin, Shuvon B, NP  clotrimazole (LOTRIMIN) 1 % cream Apply to affected area of your thighs 2 times daily Patient not taking: No sig reported 11/27/14   Janne Napoleon, NP  dicyclomine (BENTYL) 10 MG capsule TAKE 1 CAPSULE BY MOUTH 3 TIMES DAILY WITH MEALS  As needed for abdominal cramping and loose stool. Patient not taking: No sig reported 12/08/14   Gelene Mink, NP  divalproex (DEPAKOTE) 500 MG DR tablet Take 2 tablets (1,000 mg total) by mouth at bedtime. Patient not  taking: No sig reported 04/04/14   Rankin, Shuvon B, NP  doxycycline (VIBRAMYCIN) 100 MG capsule Take 1 capsule (100 mg total) by mouth 2 (two) times daily. Patient not taking: No sig reported 11/27/14   Janne Napoleon, NP  metroNIDAZOLE (FLAGYL) 500 MG tablet Take 1 tablet (500 mg total) by mouth 2 (two) times daily. Patient not taking: No sig reported 11/27/14   Janne Napoleon, NP    Allergies    Acetaminophen, Bee pollen, and Shellfish allergy  Review of Systems   Review of Systems  Constitutional:       Per HPI, otherwise negative  HENT:       Per HPI, otherwise negative   Respiratory:       Per HPI, otherwise negative  Cardiovascular:       Per HPI, otherwise negative  Gastrointestinal: Positive for abdominal pain. Negative for vomiting.  Endocrine:       Negative aside from HPI  Genitourinary:       Neg aside from HPI   Musculoskeletal:       Per HPI, otherwise negative  Skin: Negative.   Neurological: Negative for syncope.    Physical Exam Updated Vital Signs BP 116/80   Pulse 60   Temp (!) 97.3 F (36.3 C) (Oral)   Resp 13   Ht 6\' 1"  (1.854 m)   Wt 88.5 kg   SpO2 98%   BMI 25.73 kg/m   Physical Exam Vitals and nursing note reviewed.  Constitutional:      General: He is not in acute distress.    Appearance: He is well-developed.  HENT:     Head: Normocephalic and atraumatic.  Eyes:     Conjunctiva/sclera: Conjunctivae normal.  Cardiovascular:     Rate and Rhythm: Normal rate and regular rhythm.  Pulmonary:     Effort: Pulmonary effort is normal. No respiratory distress.     Breath sounds: No stridor.  Abdominal:     General: There is no distension.  Skin:    General: Skin is warm and dry.  Neurological:     Mental Status: He is alert and oriented to person, place, and time.  Psychiatric:        Mood and Affect: Mood is anxious.     ED Results / Procedures / Treatments   Labs (all labs ordered are listed, but only abnormal results are displayed) Labs Reviewed  COMPREHENSIVE METABOLIC PANEL - Abnormal; Notable for the following components:      Result Value   Sodium 134 (*)    Total Bilirubin 1.4 (*)    All other components within normal limits  LIPASE, BLOOD  CBC  URINALYSIS, ROUTINE W REFLEX MICROSCOPIC    Procedures Procedures   Medications Ordered in ED Medications  sodium chloride 0.9 % bolus 1,000 mL (1,000 mLs Intravenous New Bag/Given 03/11/21 0835)  ketorolac (TORADOL) 30 MG/ML injection 15 mg (15 mg Intravenous Given 03/11/21 0836)    ED Course  I have reviewed the triage vital signs and the  nursing notes.  Pertinent labs & imaging results that were available during my care of the patient were reviewed by me and considered in my medical decision making (see chart for details).  Patient evaluated beginning of last month with intractable nausea, vomiting.  Update:, Patient in no distress, sleeping now, but awakens easily.  1:11 PM Patient sleeping, awakens easily again.  Vital signs unremarkable, labs reviewed, discussed, COVID-negative, trace abnormalities, no evidence for bacteremia, sepsis, no evidence for  pancreatitis, patient had no urinary complaints.  Patient discharged, agreeably.    Final Clinical Impression(s) / ED Diagnoses Final diagnoses:  Pain     Gerhard Munch, MD 03/11/21 1312

## 2021-03-11 NOTE — Discharge Instructions (Addendum)
As discussed, your evaluation today has been largely reassuring.  But, it is important that you monitor your condition carefully, and do not hesitate to return to the ED if you develop new, or concerning changes in your condition. ? ?Otherwise, please follow-up with your physician for appropriate ongoing care. ? ?

## 2021-03-11 NOTE — ED Triage Notes (Signed)
Patient reports generalized abdominal pain x 2 weeks, denies fever, nausea, vomiting, diarrhea

## 2021-06-26 ENCOUNTER — Other Ambulatory Visit: Payer: Self-pay

## 2021-06-26 ENCOUNTER — Emergency Department (HOSPITAL_COMMUNITY)
Admission: EM | Admit: 2021-06-26 | Discharge: 2021-06-27 | Disposition: A | Discharge status: Home / Self Care | Payer: Medicaid Other | Attending: Emergency Medicine | Admitting: Emergency Medicine

## 2021-06-26 ENCOUNTER — Encounter (HOSPITAL_COMMUNITY): Payer: Self-pay

## 2021-06-26 DIAGNOSIS — F209 Schizophrenia, unspecified: Secondary | ICD-10-CM

## 2021-06-26 DIAGNOSIS — Z9114 Patient's other noncompliance with medication regimen: Secondary | ICD-10-CM | POA: Diagnosis not present

## 2021-06-26 DIAGNOSIS — Z79899 Other long term (current) drug therapy: Secondary | ICD-10-CM | POA: Diagnosis not present

## 2021-06-26 DIAGNOSIS — J45909 Unspecified asthma, uncomplicated: Secondary | ICD-10-CM | POA: Diagnosis not present

## 2021-06-26 DIAGNOSIS — F419 Anxiety disorder, unspecified: Secondary | ICD-10-CM | POA: Diagnosis not present

## 2021-06-26 DIAGNOSIS — Z20822 Contact with and (suspected) exposure to covid-19: Secondary | ICD-10-CM | POA: Insufficient documentation

## 2021-06-26 DIAGNOSIS — R451 Restlessness and agitation: Secondary | ICD-10-CM | POA: Diagnosis not present

## 2021-06-26 DIAGNOSIS — F250 Schizoaffective disorder, bipolar type: Secondary | ICD-10-CM | POA: Insufficient documentation

## 2021-06-26 DIAGNOSIS — F17210 Nicotine dependence, cigarettes, uncomplicated: Secondary | ICD-10-CM | POA: Diagnosis not present

## 2021-06-26 DIAGNOSIS — Y900 Blood alcohol level of less than 20 mg/100 ml: Secondary | ICD-10-CM | POA: Insufficient documentation

## 2021-06-26 DIAGNOSIS — F1099 Alcohol use, unspecified with unspecified alcohol-induced disorder: Secondary | ICD-10-CM | POA: Diagnosis not present

## 2021-06-26 LAB — CBC WITH DIFFERENTIAL/PLATELET
Abs Immature Granulocytes: 0.01 10*3/uL (ref 0.00–0.07)
Basophils Absolute: 0 10*3/uL (ref 0.0–0.1)
Basophils Relative: 0 %
Eosinophils Absolute: 0.1 10*3/uL (ref 0.0–0.5)
Eosinophils Relative: 1 %
HCT: 42.5 % (ref 39.0–52.0)
Hemoglobin: 14.2 g/dL (ref 13.0–17.0)
Immature Granulocytes: 0 %
Lymphocytes Relative: 22 %
Lymphs Abs: 1.5 10*3/uL (ref 0.7–4.0)
MCH: 29.3 pg (ref 26.0–34.0)
MCHC: 33.4 g/dL (ref 30.0–36.0)
MCV: 87.6 fL (ref 80.0–100.0)
Monocytes Absolute: 0.5 10*3/uL (ref 0.1–1.0)
Monocytes Relative: 7 %
Neutro Abs: 5 10*3/uL (ref 1.7–7.7)
Neutrophils Relative %: 70 %
Platelets: 196 10*3/uL (ref 150–400)
RBC: 4.85 MIL/uL (ref 4.22–5.81)
RDW: 13.7 % (ref 11.5–15.5)
WBC: 7.1 10*3/uL (ref 4.0–10.5)
nRBC: 0 % (ref 0.0–0.2)

## 2021-06-26 LAB — COMPREHENSIVE METABOLIC PANEL
ALT: 18 U/L (ref 0–44)
AST: 24 U/L (ref 15–41)
Albumin: 4.7 g/dL (ref 3.5–5.0)
Alkaline Phosphatase: 42 U/L (ref 38–126)
Anion gap: 12 (ref 5–15)
BUN: 12 mg/dL (ref 6–20)
CO2: 24 mmol/L (ref 22–32)
Calcium: 9.4 mg/dL (ref 8.9–10.3)
Chloride: 102 mmol/L (ref 98–111)
Creatinine, Ser: 1.01 mg/dL (ref 0.61–1.24)
GFR, Estimated: >60 mL/min (ref >60)
Glucose, Bld: 91 mg/dL (ref 70–99)
Potassium: 3.2 mmol/L — ABNORMAL LOW (ref 3.5–5.1)
Sodium: 138 mmol/L (ref 135–145)
Total Bilirubin: 1.7 mg/dL — ABNORMAL HIGH (ref 0.3–1.2)
Total Protein: 7.8 g/dL (ref 6.5–8.1)

## 2021-06-26 LAB — RESP PANEL BY RT-PCR (FLU A&B, COVID) ARPGX2
Influenza A by PCR: NEGATIVE
Influenza B by PCR: NEGATIVE
SARS Coronavirus 2 by RT PCR: NEGATIVE

## 2021-06-26 LAB — ETHANOL: Alcohol, Ethyl (B): <10 mg/dL (ref <10)

## 2021-06-26 LAB — CK: Total CK: 419 U/L — ABNORMAL HIGH (ref 49–397)

## 2021-06-26 MED ORDER — ZIPRASIDONE MESYLATE 20 MG IM SOLR: 10.0000 mg | Freq: Once | INTRAMUSCULAR | Status: DC

## 2021-06-26 MED ORDER — POTASSIUM CHLORIDE CRYS ER 20 MEQ PO TBCR
40.0000 meq | EXTENDED_RELEASE_TABLET | Freq: Once | ORAL | Status: AC
  Administered 2021-06-26: 40 meq via ORAL
  Filled 2021-06-26: qty 2

## 2021-06-26 NOTE — ED Triage Notes (Signed)
Per EMS- Patient's significant other called EMS. When EMS arrived, the patient stood up from a chair and stated he was going to have a seizure, then the patient became agitated and aggressive. Patient was trying to pull his skin off. HR-160 and BP-180/100. Patient was given haldol 5 mg IM and HR decreased to 80 and BP-140/60. Patient calmer upon arrival to the ED.  Patient's family reports that the patient has not taken his Haldol or Depakote in months.

## 2021-06-26 NOTE — ED Provider Notes (Signed)
[facility]-EMERGENCY DEPT Provider Note   CSN: [id number] Arrival date & time: 06/26/21  1800     History Chief Complaint  Patient presents with   Psychiatric Evaluation    David Blankenship is a 35 y.o. male.  35 year old male with history of schizophrenia bipolar has been noncompliant with his medications in the past 5 months presents with acute agitation.  Patient had become aggressive also as well 2.  Was medicated with Haldol by EMS.  States he does feel better.  Denies any SI or HI.  States that he has not been responding to internal stimuli.  Does admit to alcohol use this evening.      Past Medical History:  Diagnosis Date   ADHD (attention deficit hyperactivity disorder)    Asthma    Bipolar 1 disorder (HCC)    Depression    Schizophrenia (HCC)    Seizures (HCC)     Patient Active Problem List   Diagnosis Date Noted   Abdominal pain, right upper quadrant 07/15/2014   IBS (irritable bowel syndrome) 06/11/2014   Unspecified constipation 04/20/2013   Anemia 02/04/2013   Thrombocytopenia, unspecified (HCC) 02/04/2013   Gastro-esophageal reflux disease with esophagitis 02/04/2013   Alcohol abuse 12/26/2011   Cannabis abuse 12/26/2011   Schizoaffective disorder, bipolar type (HCC) 12/25/2011    Past Surgical History:  Procedure Laterality Date   COLONOSCOPY WITH ESOPHAGOGASTRODUODENOSCOPY (EGD)  10/28/2012   [id number] tiny distal esophageal erosions consistent with mild erosive reflux esophagitis. Hiatal hernia. 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 no h.pylori   NO PAST SURGERIES     none         Family History  Problem Relation Age of Onset   Seizures Father    Asthma Other    Seizures Other    Cancer Other        aunt    Social History   Tobacco Use   Smoking status: Every Day     Packs/day: 0.50    Years: 12.00    Pack years: 6.00    Types: Cigarettes   Smokeless tobacco: Never   Tobacco comments:    5 or 6 cigarettes daily  Vaping Use   Vaping Use: Never used  Substance Use Topics   Alcohol use: Yes    Alcohol/week: 35.0 standard drinks    Types: 35 Cans of beer per week    Comment: daily 5-6 beers    Drug use: Yes    Types: Marijuana, Cocaine    Home Medications Prior to Admission medications   Medication Sig Start Date End Date Taking? Authorizing Provider  buPROPion (WELLBUTRIN XL) 150 MG 24 hr tablet Take 1 tablet (150 mg total) by mouth every morning. Patient not taking: No sig reported 04/04/14   Rankin, Shuvon B, NP  clotrimazole (LOTRIMIN) 1 % cream Apply to affected area of your thighs 2 times daily Patient not taking: No sig reported 11/27/14   Janne Napoleon, NP  dicyclomine (BENTYL) 10 MG capsule TAKE 1 CAPSULE BY MOUTH 3 TIMES DAILY WITH MEALS  As needed for abdominal cramping and loose stool. Patient not taking: No sig reported 12/08/14   Gelene Mink, NP  divalproex (DEPAKOTE) 500 MG DR tablet Take 2 tablets (1,000 mg total) by mouth at bedtime. Patient not taking: No sig reported 04/04/14   Rankin, Shuvon B, NP  doxycycline (VIBRAMYCIN) 100 MG capsule Take 1  capsule (100 mg total) by mouth 2 (two) times daily. Patient not taking: No sig reported 11/27/14   Janne Napoleon, NP  metroNIDAZOLE (FLAGYL) 500 MG tablet Take 1 tablet (500 mg total) by mouth 2 (two) times daily. Patient not taking: No sig reported 11/27/14   Janne Napoleon, NP    Allergies    Acetaminophen, Bee pollen, and Shellfish allergy  Review of Systems   Review of Systems  All other systems reviewed and are negative.  Physical Exam Updated Vital Signs BP 121/84 (BP Location: Right Arm)   Pulse 86   Temp 98.1 F (36.7 C) (Oral)   Resp 17   Ht 1.854 m (6\' 1" )   Wt 88.5 kg   SpO2 100%   BMI 25.74 kg/m   Physical Exam Vitals and nursing note reviewed.  Constitutional:       General: He is not in acute distress.    Appearance: Normal appearance. He is well-developed. He is not toxic-appearing.  HENT:     Head: Normocephalic and atraumatic.  Eyes:     General: Lids are normal.     Conjunctiva/sclera: Conjunctivae normal.     Pupils: Pupils are equal, round, and reactive to light.  Neck:     Thyroid: No thyroid mass.     Trachea: No tracheal deviation.  Cardiovascular:     Rate and Rhythm: Normal rate and regular rhythm.     Heart sounds: Normal heart sounds. No murmur heard.   No gallop.  Pulmonary:     Effort: Pulmonary effort is normal. No respiratory distress.     Breath sounds: Normal breath sounds. No stridor. No decreased breath sounds, wheezing, rhonchi or rales.  Abdominal:     General: There is no distension.     Palpations: Abdomen is soft.     Tenderness: There is no abdominal tenderness. There is no rebound.  Musculoskeletal:        General: No tenderness. Normal range of motion.     Cervical back: Normal range of motion and neck supple.  Skin:    General: Skin is warm and dry.     Findings: No abrasion or rash.  Neurological:     Mental Status: He is alert and oriented to person, place, and time. Mental status is at baseline.     GCS: GCS eye subscore is 4. GCS verbal subscore is 5. GCS motor subscore is 6.     Cranial Nerves: Cranial nerves are intact. No cranial nerve deficit.     Sensory: No sensory deficit.     Motor: Motor function is intact.  Psychiatric:        Attention and Perception: Attention normal.        Mood and Affect: Mood is anxious.        Speech: Speech is rapid and pressured.        Behavior: Behavior is hyperactive.    ED Results / Procedures / Treatments   Labs (all labs ordered are listed, but only abnormal results are displayed) Labs Reviewed  RESP PANEL BY RT-PCR (FLU A&B, COVID) [id number]  ETHANOL  RAPID URINE DRUG SCREEN, HOSP PERFORMED  CBC WITH DIFFERENTIAL/PLATELET  COMPREHENSIVE METABOLIC  PANEL  CK    EKG None  Radiology No results found.  Procedures Procedures   Medications Ordered in ED Medications - No data to display  ED Course  I have reviewed the triage vital signs and the nursing notes.  Pertinent labs & imaging results that were  available during my care of the patient were reviewed by me and considered in my medical decision making (see chart for details).    MDM Rules/Calculators/A&P                           Patient medically clear for psychiatric referral Final Clinical Impression(s) / ED Diagnoses Final diagnoses:  None    Rx / DC Orders ED Discharge Orders     None        Lorre Nick, MD 06/26/21 2005

## 2021-06-26 NOTE — ED Notes (Signed)
Pt was punching the recliner after asking for pain medication from the pharmacy team.

## 2021-06-27 NOTE — ED Provider Notes (Signed)
Pt has a hx of schizophrenia and has been noncompliant with his meds.  He was very agitated yesterday, but received Haldol and has been feeling much better.  Per nurse, pt has been cooperative today.  Pt has been waiting for an inpatient psych facility.  He no longer wants to wait and does not meet IVC criteria.  He is encouraged to take his meds.  Return if worse.    Jacalyn Lefevre, MD 06/27/21 1850

## 2021-06-27 NOTE — ED Notes (Signed)
Lunch tray given. 

## 2021-06-27 NOTE — ED Notes (Signed)
Pt updated on plan of care. No further needs at this time. Pt resting comfortably in bed. Bed in locked and lowest position.

## 2021-06-27 NOTE — ED Notes (Signed)
Pt resting no signs of distress

## 2021-06-27 NOTE — ED Provider Notes (Signed)
Emergency Medicine Observation Re-evaluation Note  David Blankenship is a 35 y.o. male, seen on rounds today.  Pt initially presented to the ED for complaints of Psychiatric Evaluation Currently, the patient is awaiting TTS recommendations.  Presented yesterday evening for acute agitation.  Physical Exam  BP 119/75 (BP Location: Left Arm)   Pulse 77   Temp 98.1 F (36.7 C) (Oral)   Resp 17   Ht 6\' 1"  (1.854 m)   Wt 88.5 kg   SpO2 100%   BMI 25.74 kg/m  Physical Exam General: Awake, alert, no distress Cardiac: Normal rate and rhythm Lungs: Breathing is even and unlabored Psych: No agitation, does not appear to be responding to internal stimuli.  ED Course / MDM  EKG:   I have reviewed the labs performed to date as well as medications administered while in observation.  Recent changes in the last 24 hours include the following: Psychiatry has recommended inpatient psychiatric admission.  I did speak with the patient's grandmother (who raised him) regarding this plan.  Plan  Current plan is for inpatient psychiatric admission.  David Blankenship is not under involuntary commitment.     [name], MD 06/27/21 [phone]

## 2021-06-27 NOTE — ED Notes (Signed)
Pt verbalized dc instructions and follow up care. Alert and ambulatory. No IV. All belongings given back to pt. Pt taking bus home

## 2021-06-27 NOTE — Discharge Instructions (Addendum)
Take your medication

## 2021-06-27 NOTE — BH Assessment (Signed)
[facility] Assessment Progress Note   Per Nira Conn , NP, this voluntary pt requires psychiatric hospitalization at this time.  Per Nelly Rout, MD [facility] does not have an appropriate bed for pt at this time, and pt is therefore to be referred to facilities outside of the [facility] system.  The following facilities have been contacted to seek placement for this pt, with results as noted:   Beds available, information sent, decision pending: [facility]     If this voluntary pt is accepted to a facility, please discuss disposition with pt to be sure that he agrees to the plan.  If a facility agrees to accept pt and the plan changes in any way please call the facility to inform them of the change.  Final disposition is pending as of this writing.   Doylene Canning, [place] [facility] Coordinator [phone]

## 2021-06-27 NOTE — ED Notes (Signed)
TTS consult in progress. °

## 2021-06-27 NOTE — BH Assessment (Signed)
Comprehensive Clinical Assessment (CCA) Note  06/27/2021 David Blankenship [id number]  DISPOSITION: Gave clinical report to Nira Conn, FNP who determined Pt meets criteria for inpatient psychiatric treatment. [facility] is at capacity. Notified Dr Paula Libra and Adora Fridge, RN of recommendation via secure message.  The patient demonstrates the following risk factors for suicide: Chronic risk factors for suicide include: psychiatric disorder of Schizoaffective disorder . Acute risk factors for suicide include:  psychotic symptoms . Protective factors for this patient include: positive social support, responsibility to others (children, family), and hope for the future. Considering these factors, the overall suicide risk at this point appears to be low. Patient is appropriate for outpatient follow up.  Flowsheet Row ED from 06/26/2021 in [facility] ED from 03/11/2021 in [facility] EMERGENCY DEPARTMENT ED from 01/21/2021 in [facility]  C-SSRS RISK CATEGORY No Risk No Risk No Risk      Pt is a 35 year old single male who presents unaccompanied to [facility] ED with manic symptoms, agitation, and delusions. Per medical record, Pt's significant other called EMS. When EMS arrived, the Pt stood up from a chair and stated he was going to have a seizure, then became agitated and aggressive. Patient was trying to pull his skin off. Patient was given haldol 5 mg IM. While in the ED, Pt was observed lunching a chair.  Pt presents with pressured speech, agitation, and restlessness. He says he "feels funny" and there is something wrong with his body and face. Pt says his skin is hard and that he cannot feel anything. He frantically pulls at his skin, opens his mouth to show how tight his skin is, and displays his arms stating he can see the bones and veins-- Pt's body appears normal. Pt pulls his nostrils open with his fingers  and says he cannot breath because the air "is bad" and worries he is inhaling carbon monoxide in the ED. Pt says he feels like he "is about to die." He reports sleeping 1-2 hours per night and says he cannot eat. Pt acknowledges symptoms including crying spells, social withdrawal, loss of interest in usual pleasures, fatigue, irritability, decreased concentration, and feelings of worthlessness and hopelessness. He denies current suicidal ideation or history of suicide attempts. He denies current homicidal ideation or history of violence. He denies visual hallucinations. When asked if he is experiencing auditory hallucinations, Pt hesitates and then replies no. Pt says he drinks 3-4 cans of beer and smokes 3-4 blunts of marijuana daily. He denies other substance use-- UDS is in process and BAL<5.  Pt cannot identify any stressors other than his perceived medical problem. He says he has not taken psychiatric medications, which he says include Depakote and Haldol, in approximately six months. He says he has no mental health providers. Pt reports he lives with his grandmother. He identifies his grandmother, his aunt, and his girlfriend as his primary supports. Pt's medical record indicates he is on disability due to mental health diagnosis. Denies history of abuse. He denies legal problems. He denies access to firearms. He says he has been psychiatrically hospitalized in the past and Pt's medical record indicates he was inpatient at [facility] in 2014.  Pt is alert and oriented x4. Pt speaks with a stutter, at loud volume and pressured pace. Motor behavior appears restless and agitated. Eye contact is good. Pt's mood is anxious and affect is congruent with mood. Thought process is coherent and  Pt is perseverating on perceived somatic symptoms. Pt's insight is poor and judgment is impaired. Pt was cooperative throughout assessment.   Chief Complaint:  Chief Complaint  Patient presents with   Psychiatric  Evaluation   Visit Diagnosis: F25.0 Schizoaffective disorder, Bipolar type   CCA Screening, Triage and Referral (STR)  Patient Reported Information How did you hear about us? Family/Friend  Referral name: No data recorded Referral phone number: No data recorded  Whom do you see for routine medical problems? No data recorded Practice/Facility Name: No data recorded Practice/Facility Phone Number: No data recorded Name of Contact: No data recorded Contact Number: No data recorded Contact Fax Number: No data recorded Prescriber Name: No data recorded Prescriber Address (if known): No data recorded  What Is the Reason for Your Visit/Call Today? Pt has diagnosis of schizoaffective disorder, bipolar type and reports he has not taken psychiatric medications in 6 months. Today family called EMS due to Pt being agitated and aggressive. Pt thinks there is something wrong with his body and his face. He reported was trying to pull his skin off and was medicated.  How Long Has This Been Causing You Problems? <Week  What Do You Feel Would Help You the Most Today? Treatment for Depression or other mood problem; Medication(s)   Have You Recently Been in Any Inpatient Treatment (Hospital/Detox/Crisis Center/28-Day Program)? No data recorded Name/Location of Program/Hospital:No data recorded How Long Were You There? No data recorded When Were You Discharged? No data recorded  Have You Ever Received Services [facility] Before? No data recorded [facility]? No data recorded  Have You Recently Had Any Thoughts About Hurting Yourself? No  Are You Planning to Commit Suicide/Harm Yourself At This time? No   Have you Recently Had Thoughts About Hurting Someone Karolee Ohslse? No  Explanation: No data recorded  Have You Used Any Alcohol or Drugs in the Past 24 Hours? Yes  How Long Ago Did You Use Drugs or Alcohol? No data recorded What Did You Use and How Much? 3 cans of beer and  3 blunts of marijuana   Do You Currently Have a Therapist/Psychiatrist? No  Name of Therapist/Psychiatrist: No data recorded  Have You Been Recently Discharged From Any Office Practice or Programs? No  Explanation of Discharge From Practice/Program: No data recorded    CCA Screening Triage Referral Assessment Type of Contact: Tele-Assessment  Is this Initial or Reassessment? Initial Assessment  Date Telepsych consult ordered in [facility]:  06/26/21  [facility]  [facility]   Patient Reported Information Reviewed? No data recorded Patient Left Without Being Seen? No data recorded Reason for Not Completing Assessment: No data recorded  Collateral Involvement: None   Does Patient Have a Court Appointed Legal Guardian? No data recorded Name and Contact of Legal Guardian: No data recorded If Minor and Not Living with Parent(s), Who has Custody? No data recorded Is CPS involved or ever been involved? Never  Is APS involved or ever been involved? Never   Patient Determined To Be At Risk for Harm To Self or Others Based on Review of Patient Reported Information or Presenting Complaint? Yes, for Self-Harm  Method: No data recorded Availability of Means: No data recorded Intent: No data recorded Notification Required: No data recorded Additional Information for Danger to Others Potential: No data recorded Additional Comments for Danger to Others Potential: No data recorded Are There Guns or Other Weapons in Your Home? No data recorded Types  of Guns/Weapons: No data recorded Are These Weapons Safely Secured?                            No data recorded Who Could Verify You Are Able To Have These Secured: No data recorded Do You Have any Outstanding Charges, Pending Court Dates, Parole/Probation? No data recorded Contacted To Inform of Risk of Harm To Self or Others: No data recorded  Location of Assessment: [facility] ED   Does Patient Present under Involuntary  Commitment? No  IVC Papers Initial File Date: No data recorded  [place] of Residence: [place]   Patient Currently Receiving the Following Services: Not Receiving Services   Determination of Need: Emergent (2 hours)   Options For Referral: Inpatient Hospitalization; Medication Management; Outpatient Therapy     CCA Biopsychosocial Intake/Chief Complaint:  No data recorded Current Symptoms/Problems: No data recorded  Patient Reported Schizophrenia/Schizoaffective Diagnosis in Past: Yes   Strengths: Pt is willing to take medication  Preferences: No data recorded Abilities: No data recorded  Type of Services Patient Feels are Needed: No data recorded  Initial Clinical Notes/Concerns: No data recorded  Mental Health Symptoms Depression:   Change in energy/activity; Difficulty Concentrating; Fatigue; Hopelessness; Increase/decrease in appetite; Irritability; Tearfulness; Sleep (too much or little); Weight gain/loss; Worthlessness   Duration of Depressive symptoms:  Greater than two weeks   Mania:   Change in energy/activity; Increased Energy; Irritability; Racing thoughts; Recklessness   Anxiety:    Difficulty concentrating; Fatigue; Irritability; Restlessness; Sleep; Tension; Worrying   Psychosis:   Delusions   Duration of Psychotic symptoms:  Less than six months   Trauma:   None   Obsessions:   None   Compulsions:   None   Inattention:   N/A   Hyperactivity/Impulsivity:   N/A   Oppositional/Defiant Behaviors:   N/A   Emotional Irregularity:   None   Other Mood/Personality Symptoms:   NA    Mental Status Exam Appearance and self-care  Stature:   Average   Weight:   Average weight   Clothing:   Casual   Grooming:   Neglected   Cosmetic use:   None   Posture/gait:   Bizarre   Motor activity:   Agitated; Restless   Sensorium  Attention:   Distractible   Concentration:   Anxiety interferes; Preoccupied   Orientation:    X5   Recall/memory:   Normal   Affect and Mood  Affect:   Anxious; Labile   Mood:   Anxious   Relating  Eye contact:   Normal   Facial expression:   Anxious   Attitude toward examiner:   Cooperative; Dramatic   Thought and Language  Speech flow:  Pressured   Thought content:   Delusions   Preoccupation:   Other (Comment) (Feels something is wrong with his skin)   Hallucinations:   None   Organization:  No data recorded  [employer] of Knowledge:   Average   Intelligence:   Average   Abstraction:   Concrete   Judgement:   Impaired   Reality Testing:   Distorted   Insight:   Poor   Decision Making:   Impulsive   Social Functioning  Social Maturity:   Impulsive   Social Judgement:   Heedless   Stress  Stressors:   Work; Office manager Ability:   Human resources officer Deficits:   Self-control   Supports:   Family; Friends/Service  system     Religion: Religion/Spirituality Are You A Religious Person?: Yes What is Your Religious Affiliation?: Christian How Might This Affect Treatment?: NA  Leisure/Recreation: Leisure / Recreation Do You Have Hobbies?: Yes Leisure and Hobbies: plays video games, listening to music, just chill and get my mind right  Exercise/Diet: Exercise/Diet Do You Exercise?: No Have You Gained or Lost A Significant Amount of Weight in the Past Six Months?: Yes-Lost Number of Pounds Lost?:  (Unknown) Do You Follow a Special Diet?: Yes Type of Diet: "I only eat real food." Do You Have Any Trouble Sleeping?: Yes Explanation of Sleeping Difficulties: Pt reports sleeping 1-2 hours   CCA Employment/Education Employment/Work Situation: Employment / Work Situation Employment Situation: Unemployed Patient's Job has Been Impacted by Current Illness: Yes Describe how Patient's Job has Been Impacted: impulsive Has Patient ever Been in the [employer]?: No  Education: Education Is Patient  Currently Attending School?: No Last Grade Completed: 12 Did You Product manager?: No Did You Have An Individualized Education Program (IIEP): No Did You Have Any Difficulty At School?: No Patient's Education Has Been Impacted by Current Illness: No   CCA Family/Childhood History Family and Relationship History: Family history Marital status: Single Does patient have children?: No  Childhood History:  Childhood History By whom was/is the patient raised?: Grandparents Did patient suffer any verbal/emotional/physical/sexual abuse as a child?: No Did patient suffer from severe childhood neglect?: No Has patient ever been sexually abused/assaulted/raped as an adolescent or adult?: No Was the patient ever a victim of a crime or a disaster?: No Witnessed domestic violence?: No Has patient been affected by domestic violence as an adult?: No  Child/Adolescent Assessment:     CCA Substance Use Alcohol/Drug Use: Alcohol / Drug Use Pain Medications: see med list Prescriptions: see med list Over the Counter: see med list History of alcohol / drug use?: Yes Longest period of sobriety (when/how long): Unknown Negative Consequences of Use:  (Pt denies) Withdrawal Symptoms:  (Pt denies) Substance #1 Name of Substance 1: Marijuana 1 - Age of First Use: 16 1 - Amount (size/oz): 3-4 blunts 1 - Frequency: Daily 1 - Duration: Ongoing 1 - Last Use / Amount: 06/26/2021 1 - Method of Aquiring: unknown 1- Route of Use: Smoking Substance #2 Name of Substance 2: Alcohol 2 - Age of First Use: 16 2 - Amount (size/oz): 3-4 beers 2 - Frequency: Daily 2 - Duration: Ongoing 2 - Last Use / Amount: 06/26/2021 2 - Method of Aquiring: Store 2 - Route of Substance Use: Oral ingestion                     ASAM's:  Six Dimensions of Multidimensional Assessment  Dimension 1:  Acute Intoxication and/or Withdrawal Potential:   Dimension 1:  Description of individual's past and current  experiences of substance use and withdrawal: Pt reports drinking 3-4 cans of beer daily and 3-4 blunts of marijuana  Dimension 2:  Biomedical Conditions and Complications:   Dimension 2:  Description of patient's biomedical conditions and  complications: Pt reports histroy of seaizure  Dimension 3:  Emotional, Behavioral, or Cognitive Conditions and Complications:  Dimension 3:  Description of emotional, behavioral, or cognitive conditions and complications: Pt has diagnosis of schizoaffective disorder  Dimension 4:  Readiness to Change:  Dimension 4:  Description of Readiness to Change criteria: Pt does not express desire to stop using alcohol and marijuana  Dimension 5:  Relapse, Continued use, or Continued Problem Potential:  Dimension  5:  Relapse, continued use, or continued problem potential critiera description: Pt does no appear motivated to stop using alcohol or marijuana  Dimension 6:  Recovery/Living Environment:  Dimension 6:  Recovery/Iiving environment criteria description: Lives with grandmother  ASAM Severity Score: ASAM's Severity Rating Score: 9  ASAM Recommended Level of Treatment: ASAM Recommended Level of Treatment: Level I Outpatient Treatment   Substance use Disorder (SUD) Substance Use Disorder (SUD)  Checklist Symptoms of Substance Use: Continued use despite having a persistent/recurrent physical/psychological problem caused/exacerbated by use, Presence of craving or strong urge to use  Recommendations for Services/Supports/Treatments: Recommendations for Services/Supports/Treatments Recommendations For Services/Supports/Treatments: Individual Therapy  DSM5 Diagnoses: Patient Active Problem List   Diagnosis Date Noted   Abdominal pain, right upper quadrant 07/15/2014   IBS (irritable bowel syndrome) 06/11/2014   Unspecified constipation 04/20/2013   Anemia 02/04/2013   Thrombocytopenia, unspecified (HCC) 02/04/2013   Gastro-esophageal reflux disease with esophagitis  02/04/2013   Alcohol abuse 12/26/2011   Cannabis abuse 12/26/2011   Schizoaffective disorder, bipolar type (HCC) 12/25/2011    Patient Centered Plan: Patient is on the following Treatment Plan(s):  Anxiety   Referrals to Alternative Service(s): Referred to Alternative Service(s):   Place:   Date:   Time:    Referred to Alternative Service(s):   Place:   Date:   Time:    Referred to Alternative Service(s):   Place:   Date:   Time:    Referred to Alternative Service(s):   Place:   Date:   Time:     Pamalee Leyden, [facility]

## 2021-08-03 ENCOUNTER — Encounter (HOSPITAL_COMMUNITY): Payer: Self-pay | Admitting: Student

## 2021-08-03 ENCOUNTER — Emergency Department (HOSPITAL_COMMUNITY)
Admission: EM | Admit: 2021-08-03 | Discharge: 2021-08-03 | Disposition: A | Discharge status: Home / Self Care | Payer: Medicaid Other | Attending: Emergency Medicine | Admitting: Emergency Medicine

## 2021-08-03 ENCOUNTER — Other Ambulatory Visit: Payer: Self-pay

## 2021-08-03 DIAGNOSIS — Z5321 Procedure and treatment not carried out due to patient leaving prior to being seen by health care provider: Secondary | ICD-10-CM | POA: Insufficient documentation

## 2021-08-03 DIAGNOSIS — R109 Unspecified abdominal pain: Secondary | ICD-10-CM | POA: Diagnosis not present

## 2021-08-03 DIAGNOSIS — K5900 Constipation, unspecified: Secondary | ICD-10-CM | POA: Diagnosis not present

## 2021-08-03 DIAGNOSIS — R600 Localized edema: Secondary | ICD-10-CM | POA: Insufficient documentation

## 2021-08-03 DIAGNOSIS — H9209 Otalgia, unspecified ear: Secondary | ICD-10-CM | POA: Diagnosis not present

## 2021-08-03 DIAGNOSIS — Z20822 Contact with and (suspected) exposure to covid-19: Secondary | ICD-10-CM | POA: Insufficient documentation

## 2021-08-03 DIAGNOSIS — R519 Headache, unspecified: Secondary | ICD-10-CM | POA: Insufficient documentation

## 2021-08-03 LAB — BASIC METABOLIC PANEL
Anion gap: 6 (ref 5–15)
BUN: 18 mg/dL (ref 6–20)
CO2: 29 mmol/L (ref 22–32)
Calcium: 9.5 mg/dL (ref 8.9–10.3)
Chloride: 102 mmol/L (ref 98–111)
Creatinine, Ser: 0.96 mg/dL (ref 0.61–1.24)
GFR, Estimated: >60 mL/min (ref >60)
Glucose, Bld: 84 mg/dL (ref 70–99)
Potassium: 4 mmol/L (ref 3.5–5.1)
Sodium: 137 mmol/L (ref 135–145)

## 2021-08-03 LAB — CBC
HCT: 42.6 % (ref 39.0–52.0)
Hemoglobin: 14.6 g/dL (ref 13.0–17.0)
MCH: 30.9 pg (ref 26.0–34.0)
MCHC: 34.3 g/dL (ref 30.0–36.0)
MCV: 90.1 fL (ref 80.0–100.0)
Platelets: 166 10*3/uL (ref 150–400)
RBC: 4.73 MIL/uL (ref 4.22–5.81)
RDW: 14 % (ref 11.5–15.5)
WBC: 6.3 10*3/uL (ref 4.0–10.5)
nRBC: 0 % (ref 0.0–0.2)

## 2021-08-03 LAB — RESP PANEL BY RT-PCR (FLU A&B, COVID) ARPGX2
Influenza A by PCR: NEGATIVE
Influenza B by PCR: NEGATIVE
SARS Coronavirus 2 by RT PCR: NEGATIVE

## 2021-08-03 MED ORDER — IBUPROFEN 800 MG PO TABS
800.0000 mg | ORAL_TABLET | Freq: Once | ORAL | Status: AC
  Administered 2021-08-03: 800 mg via ORAL
  Filled 2021-08-03: qty 1

## 2021-08-03 NOTE — ED Provider Notes (Signed)
Emergency Medicine Provider Triage Evaluation Note  David Blankenship , a 35 y.o. male  was evaluated in triage.  Pt complains of Multiple issues. Patient is difficult to understand. He wants a test and his entire body checked out.  He states that his hands wet.  He wants to know why there is hard things under his cheeks.  He feels like his face is swelling.  He feels like his hands look abnormal.  He wonders why his veins are popping out and why his pulse is bounding and his wrist.  He feels that he has been losing weight..  Review of Systems  Positive: Weight loss Negative: fever  Physical Exam  BP 138/86 (BP Location: Left Arm)   Pulse (!) 58   Temp 98.7 F (37.1 C) (Oral)   Resp 18   SpO2 100%  Gen:   Awake, no distress   Resp:  Normal effort  MSK:   Moves extremities without difficulty  Other:    Medical Decision Making  Medically screening exam initiated at 7:36 PM.  Appropriate orders placed.  [name] was informed that the remainder of the evaluation will be completed by another provider, this initial triage assessment does not replace that evaluation, and the importance of remaining in the ED until their evaluation is complete.  Labs and covid  test ordered.  Reassurance provided   Arthor Captain, PA-C 08/03/21 1938    Charlynne Pander, MD 08/03/21 [phone]

## 2021-08-03 NOTE — ED Notes (Signed)
Pt called x1 in ED lobby. Pt could not be located within ED lobby, and did not respond to name call.   °

## 2021-08-03 NOTE — ED Triage Notes (Signed)
Patient BIB POV c/o headache, and multiple other various issues, hands wrinkled, ear pain, throat swelling, face swelling, bones popping.  Patient stating he really just wants a COVID test. He also wants to get his COVID test and leave because the bus stops running at 11 pm and he will have no way home.

## 2021-08-18 ENCOUNTER — Emergency Department (HOSPITAL_COMMUNITY)
Admission: EM | Admit: 2021-08-18 | Discharge: 2021-08-18 | Discharge status: Against Medical Advice | Payer: Medicaid Other

## 2021-08-18 NOTE — ED Notes (Signed)
Pt stated he was leaving AMA 

## 2021-08-26 ENCOUNTER — Emergency Department (HOSPITAL_COMMUNITY)
Admission: EM | Admit: 2021-08-26 | Discharge: 2021-08-26 | Disposition: A | Discharge status: Home / Self Care | Payer: Medicaid Other | Attending: Emergency Medicine | Admitting: Emergency Medicine

## 2021-08-26 ENCOUNTER — Encounter (HOSPITAL_COMMUNITY): Payer: Self-pay | Admitting: Emergency Medicine

## 2021-08-26 ENCOUNTER — Other Ambulatory Visit: Payer: Self-pay

## 2021-08-26 DIAGNOSIS — M791 Myalgia, unspecified site: Secondary | ICD-10-CM | POA: Diagnosis not present

## 2021-08-26 DIAGNOSIS — R569 Unspecified convulsions: Secondary | ICD-10-CM | POA: Diagnosis present

## 2021-08-26 DIAGNOSIS — Z5321 Procedure and treatment not carried out due to patient leaving prior to being seen by health care provider: Secondary | ICD-10-CM | POA: Insufficient documentation

## 2021-08-26 LAB — CBC WITH DIFFERENTIAL/PLATELET
Abs Immature Granulocytes: 0.03 10*3/uL (ref 0.00–0.07)
Basophils Absolute: 0 10*3/uL (ref 0.0–0.1)
Basophils Relative: 0 %
Eosinophils Absolute: 0.1 10*3/uL (ref 0.0–0.5)
Eosinophils Relative: 1 %
HCT: 41.6 % (ref 39.0–52.0)
Hemoglobin: 14.2 g/dL (ref 13.0–17.0)
Immature Granulocytes: 0 %
Lymphocytes Relative: 22 %
Lymphs Abs: 2 10*3/uL (ref 0.7–4.0)
MCH: 30.2 pg (ref 26.0–34.0)
MCHC: 34.1 g/dL (ref 30.0–36.0)
MCV: 88.5 fL (ref 80.0–100.0)
Monocytes Absolute: 0.8 10*3/uL (ref 0.1–1.0)
Monocytes Relative: 9 %
Neutro Abs: 6.3 10*3/uL (ref 1.7–7.7)
Neutrophils Relative %: 68 %
Platelets: 202 10*3/uL (ref 150–400)
RBC: 4.7 MIL/uL (ref 4.22–5.81)
RDW: 14.1 % (ref 11.5–15.5)
WBC: 9.2 10*3/uL (ref 4.0–10.5)
nRBC: 0 % (ref 0.0–0.2)

## 2021-08-26 LAB — COMPREHENSIVE METABOLIC PANEL
ALT: 13 U/L (ref 0–44)
AST: 24 U/L (ref 15–41)
Albumin: 5.1 g/dL — ABNORMAL HIGH (ref 3.5–5.0)
Alkaline Phosphatase: 38 U/L (ref 38–126)
Anion gap: 9 (ref 5–15)
BUN: 16 mg/dL (ref 6–20)
CO2: 24 mmol/L (ref 22–32)
Calcium: 9.5 mg/dL (ref 8.9–10.3)
Chloride: 103 mmol/L (ref 98–111)
Creatinine, Ser: 0.91 mg/dL (ref 0.61–1.24)
GFR, Estimated: >60 mL/min (ref >60)
Glucose, Bld: 79 mg/dL (ref 70–99)
Potassium: 3.6 mmol/L (ref 3.5–5.1)
Sodium: 136 mmol/L (ref 135–145)
Total Bilirubin: 1 mg/dL (ref 0.3–1.2)
Total Protein: 8.6 g/dL — ABNORMAL HIGH (ref 6.5–8.1)

## 2021-08-26 LAB — VALPROIC ACID LEVEL: Valproic Acid Lvl: <10 ug/mL — ABNORMAL LOW (ref 50.0–100.0)

## 2021-08-26 MED ORDER — IBUPROFEN 800 MG PO TABS
800.0000 mg | ORAL_TABLET | Freq: Once | ORAL | Status: AC
  Administered 2021-08-26: 800 mg via ORAL
  Filled 2021-08-26: qty 1

## 2021-08-26 NOTE — ED Notes (Signed)
Patient states he needs to leave to catch the bus. Providers aware.

## 2021-08-26 NOTE — ED Provider Notes (Signed)
Emergency Medicine Provider Triage Evaluation Note  David Blankenship , a 34 y.o. male  was evaluated in triage.  Pt complains of seizure earlier this afternoon.  He says he has generalized body aches.  He also says that he drank alcohol and marijuana use prior to seizure.  He has a history of seizures and he is on Depakote.  He states he does not take his medication regularly.  He states that he wants his whole body worked up.  Denies any change in mental status..  Review of Systems  Positive: Myalgias, seizure Negative: Confusion, headache, chest pain, shortness of breath, abdominal pain  Physical Exam  BP 121/85 (BP Location: Left Arm)   Pulse 80   Temp 98.2 F (36.8 C) (Oral)   Resp 16   Ht 6\' 1"  (1.854 m)   Wt 88.9 kg   SpO2 95%   BMI 25.86 kg/m  Gen:   Awake, no distress   Resp:  Normal effort  MSK:   Moves extremities without difficulty  Other:    Medical Decision Making  Medically screening exam initiated at 10:27 PM.  Appropriate orders placed.  was informed that the remainder of the evaluation will be completed by another provider, this initial triage assessment does not replace that evaluation, and the importance of remaining in the ED until their evaluation is complete.  Basic labs, Depakote level, UA ordered.  Ibuprofen ordered.   [name] 08/26/21 [id number]    [id number], MD 08/27/21 [phone]

## 2021-08-26 NOTE — ED Triage Notes (Addendum)
Patient arrives complaining of generalized pain, no other complaints. Patient endorses ETOH and marijuana use to EMS  136/94 94 99

## 2021-08-26 NOTE — ED Notes (Signed)
Patient ambulatory to restroom with steady gait.

## 2021-09-11 ENCOUNTER — Other Ambulatory Visit: Payer: Self-pay

## 2021-09-11 ENCOUNTER — Ambulatory Visit (HOSPITAL_COMMUNITY): Payer: Medicaid Other | Admitting: Licensed Clinical Social Worker

## 2021-09-11 ENCOUNTER — Encounter (HOSPITAL_COMMUNITY): Payer: Self-pay

## 2021-09-16 ENCOUNTER — Emergency Department (HOSPITAL_COMMUNITY)
Admission: EM | Admit: 2021-09-16 | Discharge: 2021-09-16 | Disposition: A | Discharge status: Home / Self Care | Payer: Medicaid Other | Attending: Emergency Medicine | Admitting: Emergency Medicine

## 2021-09-16 ENCOUNTER — Encounter (HOSPITAL_COMMUNITY): Payer: Self-pay | Admitting: Emergency Medicine

## 2021-09-16 ENCOUNTER — Emergency Department (EMERGENCY_DEPARTMENT_HOSPITAL)
Admission: EM | Admit: 2021-09-16 | Discharge: 2021-09-18 | Disposition: A | Discharge status: Home / Self Care | Payer: Medicaid Other | Source: Home / Self Care | Attending: Emergency Medicine | Admitting: Emergency Medicine

## 2021-09-16 ENCOUNTER — Other Ambulatory Visit: Payer: Self-pay

## 2021-09-16 ENCOUNTER — Ambulatory Visit (HOSPITAL_COMMUNITY)
Admission: EM | Admit: 2021-09-16 | Discharge: 2021-09-16 | Disposition: A | Discharge status: Other Acute Inpatient Hospital | Payer: Medicaid Other | Source: Home / Self Care

## 2021-09-16 DIAGNOSIS — F32A Depression, unspecified: Secondary | ICD-10-CM | POA: Diagnosis not present

## 2021-09-16 DIAGNOSIS — F1210 Cannabis abuse, uncomplicated: Secondary | ICD-10-CM | POA: Insufficient documentation

## 2021-09-16 DIAGNOSIS — R440 Auditory hallucinations: Secondary | ICD-10-CM | POA: Diagnosis not present

## 2021-09-16 DIAGNOSIS — Z5986 Financial insecurity: Secondary | ICD-10-CM | POA: Diagnosis not present

## 2021-09-16 DIAGNOSIS — F17210 Nicotine dependence, cigarettes, uncomplicated: Secondary | ICD-10-CM | POA: Insufficient documentation

## 2021-09-16 DIAGNOSIS — Z760 Encounter for issue of repeat prescription: Secondary | ICD-10-CM | POA: Diagnosis not present

## 2021-09-16 DIAGNOSIS — Z765 Malingerer [conscious simulation]: Secondary | ICD-10-CM

## 2021-09-16 DIAGNOSIS — M549 Dorsalgia, unspecified: Secondary | ICD-10-CM | POA: Insufficient documentation

## 2021-09-16 DIAGNOSIS — F29 Unspecified psychosis not due to a substance or known physiological condition: Secondary | ICD-10-CM | POA: Diagnosis present

## 2021-09-16 DIAGNOSIS — J40 Bronchitis, not specified as acute or chronic: Secondary | ICD-10-CM | POA: Insufficient documentation

## 2021-09-16 DIAGNOSIS — Z046 Encounter for general psychiatric examination, requested by authority: Secondary | ICD-10-CM | POA: Diagnosis present

## 2021-09-16 DIAGNOSIS — F1010 Alcohol abuse, uncomplicated: Secondary | ICD-10-CM | POA: Insufficient documentation

## 2021-09-16 DIAGNOSIS — F419 Anxiety disorder, unspecified: Secondary | ICD-10-CM | POA: Diagnosis not present

## 2021-09-16 DIAGNOSIS — Z638 Other specified problems related to primary support group: Secondary | ICD-10-CM | POA: Insufficient documentation

## 2021-09-16 DIAGNOSIS — M1990 Unspecified osteoarthritis, unspecified site: Secondary | ICD-10-CM | POA: Insufficient documentation

## 2021-09-16 DIAGNOSIS — Z9151 Personal history of suicidal behavior: Secondary | ICD-10-CM | POA: Insufficient documentation

## 2021-09-16 DIAGNOSIS — F250 Schizoaffective disorder, bipolar type: Secondary | ICD-10-CM | POA: Diagnosis present

## 2021-09-16 DIAGNOSIS — R369 Urethral discharge, unspecified: Secondary | ICD-10-CM | POA: Insufficient documentation

## 2021-09-16 DIAGNOSIS — R45851 Suicidal ideations: Secondary | ICD-10-CM

## 2021-09-16 DIAGNOSIS — F909 Attention-deficit hyperactivity disorder, unspecified type: Secondary | ICD-10-CM | POA: Insufficient documentation

## 2021-09-16 DIAGNOSIS — J45909 Unspecified asthma, uncomplicated: Secondary | ICD-10-CM | POA: Insufficient documentation

## 2021-09-16 DIAGNOSIS — F209 Schizophrenia, unspecified: Secondary | ICD-10-CM | POA: Diagnosis present

## 2021-09-16 DIAGNOSIS — R450 Nervousness: Secondary | ICD-10-CM | POA: Insufficient documentation

## 2021-09-16 DIAGNOSIS — G40909 Epilepsy, unspecified, not intractable, without status epilepticus: Secondary | ICD-10-CM | POA: Insufficient documentation

## 2021-09-16 DIAGNOSIS — Z20822 Contact with and (suspected) exposure to covid-19: Secondary | ICD-10-CM | POA: Insufficient documentation

## 2021-09-16 DIAGNOSIS — F1290 Cannabis use, unspecified, uncomplicated: Secondary | ICD-10-CM | POA: Insufficient documentation

## 2021-09-16 LAB — CBC WITH DIFFERENTIAL/PLATELET
Abs Immature Granulocytes: 0.03 10*3/uL (ref 0.00–0.07)
Basophils Absolute: 0 10*3/uL (ref 0.0–0.1)
Basophils Relative: 0 %
Eosinophils Absolute: 0.1 10*3/uL (ref 0.0–0.5)
Eosinophils Relative: 1 %
HCT: 40.1 % (ref 39.0–52.0)
Hemoglobin: 13 g/dL (ref 13.0–17.0)
Immature Granulocytes: 0 %
Lymphocytes Relative: 26 %
Lymphs Abs: 2 10*3/uL (ref 0.7–4.0)
MCH: 29.7 pg (ref 26.0–34.0)
MCHC: 32.4 g/dL (ref 30.0–36.0)
MCV: 91.6 fL (ref 80.0–100.0)
Monocytes Absolute: 0.7 10*3/uL (ref 0.1–1.0)
Monocytes Relative: 10 %
Neutro Abs: 4.8 10*3/uL (ref 1.7–7.7)
Neutrophils Relative %: 63 %
Platelets: 179 10*3/uL (ref 150–400)
RBC: 4.38 MIL/uL (ref 4.22–5.81)
RDW: 14.4 % (ref 11.5–15.5)
WBC: 7.6 10*3/uL (ref 4.0–10.5)
nRBC: 0 % (ref 0.0–0.2)

## 2021-09-16 LAB — URINALYSIS, ROUTINE W REFLEX MICROSCOPIC
Bilirubin Urine: NEGATIVE
Glucose, UA: NEGATIVE mg/dL
Hgb urine dipstick: NEGATIVE
Ketones, ur: NEGATIVE mg/dL
Leukocytes,Ua: NEGATIVE
Nitrite: NEGATIVE
Protein, ur: NEGATIVE mg/dL
Specific Gravity, Urine: 1.011 (ref 1.005–1.030)
pH: 7 (ref 5.0–8.0)

## 2021-09-16 LAB — COMPREHENSIVE METABOLIC PANEL
ALT: 13 U/L (ref 0–44)
AST: 19 U/L (ref 15–41)
Albumin: 4.5 g/dL (ref 3.5–5.0)
Alkaline Phosphatase: 40 U/L (ref 38–126)
Anion gap: 9 (ref 5–15)
BUN: 12 mg/dL (ref 6–20)
CO2: 27 mmol/L (ref 22–32)
Calcium: 9.3 mg/dL (ref 8.9–10.3)
Chloride: 100 mmol/L (ref 98–111)
Creatinine, Ser: 1 mg/dL (ref 0.61–1.24)
GFR, Estimated: >60 mL/min (ref >60)
Glucose, Bld: 79 mg/dL (ref 70–99)
Potassium: 3.5 mmol/L (ref 3.5–5.1)
Sodium: 136 mmol/L (ref 135–145)
Total Bilirubin: 0.7 mg/dL (ref 0.3–1.2)
Total Protein: 7.7 g/dL (ref 6.5–8.1)

## 2021-09-16 LAB — RAPID URINE DRUG SCREEN, HOSP PERFORMED
Amphetamines: NOT DETECTED
Barbiturates: NOT DETECTED
Benzodiazepines: NOT DETECTED
Cocaine: NOT DETECTED
Opiates: NOT DETECTED
Tetrahydrocannabinol: POSITIVE — AB

## 2021-09-16 LAB — RESP PANEL BY RT-PCR (FLU A&B, COVID) ARPGX2
Influenza A by PCR: NEGATIVE
Influenza B by PCR: NEGATIVE
SARS Coronavirus 2 by RT PCR: NEGATIVE

## 2021-09-16 LAB — ETHANOL: Alcohol, Ethyl (B): <10 mg/dL (ref <10)

## 2021-09-16 LAB — HIV ANTIBODY (ROUTINE TESTING W REFLEX): HIV Screen 4th Generation wRfx: NONREACTIVE

## 2021-09-16 LAB — RPR: RPR Ser Ql: NONREACTIVE

## 2021-09-16 MED ORDER — LIDOCAINE HCL 2 % IJ SOLN
INTRAMUSCULAR | Status: AC
  Filled 2021-09-16: qty 20

## 2021-09-16 MED ORDER — CEFTRIAXONE SODIUM 1 G IJ SOLR
500.0000 mg | Freq: Once | INTRAMUSCULAR | Status: AC
  Administered 2021-09-16: 500 mg via INTRAMUSCULAR
  Filled 2021-09-16: qty 10

## 2021-09-16 MED ORDER — METRONIDAZOLE 500 MG PO TABS
2000.0000 mg | ORAL_TABLET | Freq: Once | ORAL | Status: AC
  Administered 2021-09-16: 2000 mg via ORAL
  Filled 2021-09-16: qty 4

## 2021-09-16 MED ORDER — DOXYCYCLINE HYCLATE 100 MG PO CAPS: 100.0000 mg | ORAL_CAPSULE | Freq: Two times a day (BID) | ORAL | 0 refills | Status: AC

## 2021-09-16 NOTE — ED Provider Notes (Signed)
[facility] EMERGENCY DEPARTMENT Provider Note   CSN: [id number] Arrival date & time: 09/16/21  2115     History Chief Complaint  Patient presents with   Suicidal    David Blankenship is a 35 y.o. male.  The history is provided by the patient and medical records.   35 y.o. M with hx of ADHD< asthma, bipolar disorder, depression, schizophrenia, presenting to the ED from [facility] for monitoring.  Reports ongoing SI without specific plan, worsening anxiety and depression.  Ran out of his depakote about 2 weeks ago so has not been taking it.  Seen at [facility] but significant other currently on unit so required to be transferred here for overnight observation.  Had labs early this morning at [facility].  Past Medical History:  Diagnosis Date   ADHD (attention deficit hyperactivity disorder)    Asthma    Bipolar 1 disorder (HCC)    Depression    Schizophrenia (HCC)    Seizures (HCC)     Patient Active Problem List   Diagnosis Date Noted   Abdominal pain, right upper quadrant 07/15/2014   IBS (irritable bowel syndrome) 06/11/2014   Unspecified constipation 04/20/2013   Anemia 02/04/2013   Thrombocytopenia, unspecified (HCC) 02/04/2013   Gastro-esophageal reflux disease with esophagitis 02/04/2013   Alcohol abuse 12/26/2011   Cannabis abuse 12/26/2011   Schizoaffective disorder, bipolar type (HCC) 12/25/2011    Past Surgical History:  Procedure Laterality Date   COLONOSCOPY WITH ESOPHAGOGASTRODUODENOSCOPY (EGD)  10/28/2012   WSF:KCLEXN tiny distal esophageal erosions consistent with mild erosive reflux esophagitis. Hiatal hernia. 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 no h.pylori   NO PAST SURGERIES     none         Family History  Problem Relation Age of Onset   Seizures Father    Asthma Other    Seizures Other     Cancer Other        aunt    Social History   Tobacco Use   Smoking status: Every Day    Packs/day: 0.50    Years: 12.00    Pack years: 6.00    Types: Cigarettes   Smokeless tobacco: Never   Tobacco comments:    5 or 6 cigarettes daily  Vaping Use   Vaping Use: Never used  Substance Use Topics   Alcohol use: Yes    Alcohol/week: 35.0 standard drinks    Types: 35 Cans of beer per week    Comment: daily 5-6 beers    Drug use: Yes    Types: Marijuana, Cocaine    Home Medications Prior to Admission medications   Medication Sig Start Date End Date Taking? Authorizing Provider  divalproex (DEPAKOTE) 500 MG DR tablet Take 1,500 mg by mouth at bedtime.   Yes [provider]  haloperidol (HALDOL) 10 MG tablet Take 10 mg by mouth at bedtime.   Yes [provider]  buPROPion (WELLBUTRIN XL) 150 MG 24 hr tablet Take 1 tablet (150 mg total) by mouth every morning. Patient not taking: Reported on 03/11/2021 04/04/14   Rankin, Shuvon B, NP  clotrimazole (LOTRIMIN) 1 % cream Apply to affected area of your thighs 2 times daily Patient not taking: Reported on 03/11/2021 11/27/14   Janne Napoleon, NP  dicyclomine (BENTYL) 10 MG capsule TAKE 1 CAPSULE BY MOUTH 3 TIMES DAILY WITH MEALS  As needed for abdominal cramping and loose  stool. Patient not taking: Reported on 03/11/2021 12/08/14   Gelene Mink, NP  divalproex (DEPAKOTE) 500 MG DR tablet Take 2 tablets (1,000 mg total) by mouth at bedtime. Patient not taking: Reported on 03/11/2021 04/04/14   Rankin, Shuvon B, NP  doxycycline (VIBRAMYCIN) 100 MG capsule Take 1 capsule (100 mg total) by mouth 2 (two) times daily for 7 days. 09/16/21 09/23/21  Tanda Rockers, PA-C    Allergies    Acetaminophen, Bee pollen, and Shellfish allergy  Review of Systems   Review of Systems  Psychiatric/Behavioral:  Positive for suicidal ideas.   All other systems reviewed and are negative.  Physical Exam Updated Vital Signs BP 131/73   Pulse 68    Temp 97.9 F (36.6 C) (Oral)   Resp 16   SpO2 100%   Physical Exam Vitals and nursing note reviewed.  Constitutional:      Appearance: He is well-developed.  HENT:     Head: Normocephalic and atraumatic.  Eyes:     Conjunctiva/sclera: Conjunctivae normal.     Pupils: Pupils are equal, round, and reactive to light.  Cardiovascular:     Rate and Rhythm: Normal rate and regular rhythm.     Heart sounds: Normal heart sounds.  Pulmonary:     Effort: Pulmonary effort is normal. No respiratory distress.     Breath sounds: Normal breath sounds. No rhonchi.  Abdominal:     General: Bowel sounds are normal.     Palpations: Abdomen is soft.     Tenderness: There is no abdominal tenderness. There is no rebound.  Musculoskeletal:        General: Normal range of motion.     Cervical back: Normal range of motion.  Skin:    General: Skin is warm and dry.  Neurological:     Mental Status: He is alert and oriented to person, place, and time.  Psychiatric:        Thought Content: Thought content includes suicidal ideation.    ED Results / Procedures / Treatments   Labs (all labs ordered are listed, but only abnormal results are displayed) Labs Reviewed - No data to display  EKG None  Radiology No results found.  Procedures Procedures   Medications Ordered in ED Medications - No data to display  ED Course  I have reviewed the triage vital signs and the nursing notes.  Pertinent labs & imaging results that were available during my care of the patient were reviewed by me and considered in my medical decision making (see chart for details).    MDM Rules/Calculators/A&P                           35 y.o. M here with SI, anxiety, depression.  Evaluated at [facility], recommended for overnight observation but required to be transferred here as significant other currently on ward as well.  Labs done this AM, reassuring  covid/flu screen negative.  Holding orders in place.  TTS to  follow-up in AM.  Final Clinical Impression(s) / ED Diagnoses Final diagnoses:  Suicidal ideation    Rx / DC Orders ED Discharge Orders     None        Garlon Hatchet, PA-C 09/17/21 0357    Sabas Sous, MD 09/17/21 [phone]

## 2021-09-16 NOTE — BH Assessment (Addendum)
Comprehensive Clinical Assessment (CCA) Note  09/16/2021 David Blankenship [id number] Disposition: Clinician and David Back, PA saw patient.  Pt is a walk in at [facility].  [name] did the MSE.  Patient is very interested in whether he will be able to be admitted to [facility] since his significant other is on the unit also.  [name] recommended that patient be observed overnight but that he would have to go to one of the EDs.  Pt was informed of this and he was fine with being transported to [facility].    Pt talks rapidly and is fidgety.  He complains of racing thoughts.  He said he had a seizure last night and has been off his depakote for 2 weeks.  Patient says he hears voices telling him to kill himself.  Patient does not display delusional thought content.  Pt reports getting less than 4 hours sleep at night.  He says his appetite is up and down.    Pt to be reviewed by psychiatry on 11/27.    Chief Complaint:  Chief Complaint  Patient presents with   Anxiety   Suicidal   Visit Diagnosis: Schizophrenia    CCA Screening, Triage and Referral (STR)  Patient Reported Information How did you hear about [place]? Self  What Is the Reason for Your Visit/Call Today? Pt walked to [facility].  He is asking about staying in [name] and for how long he can stay. Pt says "I have been having suicidal thoughts.  I am stressed and depressed and I'm being hearing voices."  Pt says he usually takes depakote but has not had in two weeks.  He says he needs something to help him sleep too  Pt has been feeling suicidal for the last two days.  When asked about what he would do to kill himself he says "Something crazy, I haven't made my mind up."  pt has had two previous attempts.  Patient denies any HI.  Pt says he hears voices telling him to kill himself and bad things.  Pt is asking about whether he can pick a room to go in at [facility] and whether he can pick where he goes.  Pt has a place to stay and he stays with his girlfriend.  Pt  no access to weapons.  Pt denies any self harm behavior.  Pt has had racing thoughts.  .Pt has a hard time sleeping and has an appetite described as up and down.  Pt uses marijuana daily and last use was today around 07:00.  How Long Has This Been Causing You Problems? 1 wk - 1 month  What Do You Feel Would Help You the Most Today? Treatment for Depression or other mood problem   Have You Recently Had Any Thoughts About Hurting Yourself? Yes  Are You Planning to Commit Suicide/Harm Yourself At This time? No   Have you Recently Had Thoughts About Hurting Someone David Blankenship? No  Are You Planning to Harm Someone at This Time? No  Explanation: No data recorded  Have You Used Any Alcohol or Drugs in the Past 24 Hours? Yes  How Long Ago Did You Use Drugs or Alcohol? No data recorded What Did You Use and How Much? Two blunts a day.   Do You Currently Have a Therapist/Psychiatrist? No  Name of Therapist/Psychiatrist: No data recorded  Have You Been Recently Discharged From Any Office Practice or Programs? No  Explanation of Discharge From Practice/Program: No data recorded    CCA Screening  Triage Referral Assessment Type of Contact: Face-to-Face  Telemedicine Service Delivery:   Is this Initial or Reassessment? Initial Assessment  Date Telepsych consult ordered in [facility]:  06/26/21  Time Telepsych consult ordered in [facility]:  2005  Location of Assessment: [facility] Assessment Services  Provider Location: [facility] Assessment Services   Collateral Involvement: David Blankenship [phone], grandmother and guardian; .  David Blankenship, aunt [phone]   Does Patient Have a Court Appointed Legal Guardian? No data recorded Name and Contact of Legal Guardian: No data recorded If Minor and Not Living with Parent(s), Who has Custody? No data recorded Is CPS involved or ever been involved? Never  Is APS involved or ever been involved? Never   Patient Determined To Be At Risk for Harm To  Self or Others Based on Review of Patient Reported Information or Presenting Complaint? No  Method: No data recorded Availability of Means: No data recorded Intent: No data recorded Notification Required: No data recorded Additional Information for Danger to Others Potential: No data recorded Additional Comments for Danger to Others Potential: No data recorded Are There Guns or Other Weapons in Your Home? No data recorded Types of Guns/Weapons: No data recorded Are These Weapons Safely Secured?                            No data recorded Who Could Verify You Are Able To Have These Secured: No data recorded Do You Have any Outstanding Charges, Pending Court Dates, Parole/Probation? No data recorded Contacted To Inform of Risk of Harm To Self or Others: No data recorded   Does Patient Present under Involuntary Commitment? No  IVC Papers Initial File Date: No data recorded  [place] of Residence: [place]   Patient Currently Receiving the Following Services: Not Receiving Services   Determination of Need: Urgent (48 hours)   Options For Referral: Other: Comment (Transferred to [facility] for observation.)     CCA Biopsychosocial Patient Reported Schizophrenia/Schizoaffective Diagnosis in Past: Yes   Strengths: Pt is willing to take medication   Mental Health Symptoms Depression:   Change in energy/activity; Difficulty Concentrating; Fatigue; Hopelessness; Increase/decrease in appetite; Irritability; Tearfulness; Sleep (too much or little); Weight gain/loss; Worthlessness   Duration of Depressive symptoms:  Duration of Depressive Symptoms: Greater than two weeks   Mania:   Change in energy/activity; Increased Energy; Racing thoughts   Anxiety:    Restlessness; Tension; Worrying; Irritability; Difficulty concentrating   Psychosis:   Hallucinations   Duration of Psychotic symptoms:  Duration of Psychotic Symptoms: Less than six months   Trauma:   None   Obsessions:    None   Compulsions:   None   Inattention:   None   Hyperactivity/Impulsivity:   None   Oppositional/Defiant Behaviors:   None   Emotional Irregularity:   None   Other Mood/Personality Symptoms:   NA    Mental Status Exam Appearance and self-care  Stature:   Average   Weight:   Average weight   Clothing:   Casual   Grooming:   Neglected   Cosmetic use:   None   Posture/gait:   Slumped   Motor activity:   Restless; Agitated   Sensorium  Attention:   Distractible   Concentration:   Anxiety interferes; Preoccupied   Orientation:   X5   Recall/memory:   Normal   Affect and Mood  Affect:   Anxious   Mood:   Anxious   Relating  Eye contact:   Normal   Facial expression:   Responsive   Attitude toward examiner:   Dramatic; Manipulative   Thought and Language  Speech flow:  Clear and Coherent   Thought content:   Appropriate to Mood and Circumstances   Preoccupation:   None   Hallucinations:   Auditory; Visual   Organization:  No data recorded  [employer] of Knowledge:   Average   Intelligence:   Average   Abstraction:   Concrete   Judgement:   Impaired; Poor   Reality Testing:   Adequate   Insight:   Poor   Decision Making:   Impulsive   Social Functioning  Social Maturity:   Impulsive   Social Judgement:   Heedless   Stress  Stressors:   Museum/gallery curator; Relationship   Coping Ability:   Exhausted   Skill Deficits:   Self-care; Self-control   Supports:   Friends/Service system; Family     Religion: Religion/Spirituality Are You A Religious Person?: Yes How Might This Affect Treatment?: NA  Leisure/Recreation:    Exercise/Diet: Exercise/Diet Have You Gained or Lost A Significant Amount of Weight in the Past Six Months?: No Do You Follow a Special Diet?: No Do You Have Any Trouble Sleeping?: Yes Explanation of Sleeping Difficulties: Pt reports sleeping 1-2 hours   CCA  Employment/Education Employment/Work Situation: Employment / Work Situation Employment Situation: Unemployed Patient's Job has Been Impacted by Current Illness: Yes Describe how Patient's Job has Been Impacted: impulsive, difficult to hold a job. Has Patient ever Been in the [place]?: No  Education: Education Last Grade Completed: 12 Did You Attend College?: No Did You Have An Individualized Education Program (IIEP): No Did You Have Any Difficulty At School?: No   CCA Family/Childhood History Family and Relationship History: Family history Marital status: Single Does patient have children?: No  Childhood History:  Childhood History Did patient suffer any verbal/emotional/physical/sexual abuse as a child?: No Has patient ever been sexually abused/assaulted/raped as an adolescent or adult?: No Witnessed domestic violence?: No Has patient been affected by domestic violence as an adult?: No  Child/Adolescent Assessment:     CCA Substance Use Alcohol/Drug Use: Alcohol / Drug Use Pain Medications: None Prescriptions: Depakote, Haldol 10mg  at night; Over the Counter: See MAR History of alcohol / drug use?: Yes Withdrawal Symptoms: None Substance #1 Name of Substance 1: Marijuana 1 - Age of First Use: 34 years of age 24 - Amount (size/oz): Two to three blunts a day 1 - Frequency: Daily use 1 - Duration: ongoing 1 - Last Use / Amount: 11/26 1 - Method of Aquiring: illegal purchase 1- Route of Use: inhalation                       ASAM's:  Six Dimensions of Multidimensional Assessment  Dimension 1:  Acute Intoxication and/or Withdrawal Potential:      Dimension 2:  Biomedical Conditions and Complications:      Dimension 3:  Emotional, Behavioral, or Cognitive Conditions and Complications:     Dimension 4:  Readiness to Change:     Dimension 5:  Relapse, Continued use, or Continued Problem Potential:     Dimension 6:  Recovery/Living Environment:     ASAM  Severity Score:    ASAM Recommended Level of Treatment:     Substance use Disorder (SUD)    Recommendations for Services/Supports/Treatments:    Discharge Disposition: Discharge Disposition Medical Exam completed: Yes Disposition of Patient: Movement to  [facility] or [facility] ED Mode of transportation if patient is discharged/movement?: [facility] doctor)  DSM5 Diagnoses: Patient Active Problem List   Diagnosis Date Noted   Abdominal pain, right upper quadrant 07/15/2014   IBS (irritable bowel syndrome) 06/11/2014   Unspecified constipation 04/20/2013   Anemia 02/04/2013   Thrombocytopenia, unspecified ([place]) 02/04/2013   Gastro-esophageal reflux disease with esophagitis 02/04/2013   Alcohol abuse 12/26/2011   Cannabis abuse 12/26/2011   Schizoaffective disorder, bipolar type ([place]) 12/25/2011     Referrals to Alternative Service(s): Referred to Alternative Service(s):   Place:   Date:   Time:    Referred to Alternative Service(s):   Place:   Date:   Time:    Referred to Alternative Service(s):   Place:   Date:   Time:    Referred to Alternative Service(s):   Place:   Date:   Time:     [place] Session

## 2021-09-16 NOTE — ED Provider Notes (Signed)
[facility]-EMERGENCY DEPT Provider Note   CSN: [id number] Arrival date & time: 09/16/21  0410     History Chief Complaint  Patient presents with   Psychiatric Evaluation    David Blankenship is a 35 y.o. male with PMHx ADHD, bipolar disorder, depression, schizophrenia, and seizures who presents to the ED today for multiple complaints.  Patient reports that first and foremost he is here for psychiatric evaluation.  He states that he has been stressed and depressed a lot has been going on in his life.  He states that he has had thoughts of harming himself however denies any specific plans.  He denies any HI.  He does endorse some auditory hallucinations.  Patient then states that his girlfriend went to behavioral urgent care earlier today and he would like to do the same.  He is furthermore requesting a COVID test as well as STI testing.  He states that he has noticed penile discharge over the past few days.  He denies any testicular pain or swelling.  No abdominal pain, nausea, vomiting, fevers.   The history is provided by the patient and medical records.      Past Medical History:  Diagnosis Date   ADHD (attention deficit hyperactivity disorder)    Asthma    Bipolar 1 disorder (HCC)    Depression    Schizophrenia (HCC)    Seizures (HCC)     Patient Active Problem List   Diagnosis Date Noted   Abdominal pain, right upper quadrant 07/15/2014   IBS (irritable bowel syndrome) 06/11/2014   Unspecified constipation 04/20/2013   Anemia 02/04/2013   Thrombocytopenia, unspecified (HCC) 02/04/2013   Gastro-esophageal reflux disease with esophagitis 02/04/2013   Alcohol abuse 12/26/2011   Cannabis abuse 12/26/2011   Schizoaffective disorder, bipolar type (HCC) 12/25/2011    Past Surgical History:  Procedure Laterality Date   COLONOSCOPY WITH ESOPHAGOGASTRODUODENOSCOPY (EGD)  10/28/2012   RXV:QMGQQP tiny distal esophageal erosions consistent with mild  erosive reflux esophagitis. Hiatal hernia. 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 no h.pylori   NO PAST SURGERIES     none         Family History  Problem Relation Age of Onset   Seizures Father    Asthma Other    Seizures Other    Cancer Other        aunt    Social History   Tobacco Use   Smoking status: Every Day    Packs/day: 0.50    Years: 12.00    Pack years: 6.00    Types: Cigarettes   Smokeless tobacco: Never   Tobacco comments:    5 or 6 cigarettes daily  Vaping Use   Vaping Use: Never used  Substance Use Topics   Alcohol use: Yes    Alcohol/week: 35.0 standard drinks    Types: 35 Cans of beer per week    Comment: daily 5-6 beers    Drug use: Yes    Types: Marijuana, Cocaine    Home Medications Prior to Admission medications   Medication Sig Start Date End Date Taking? Authorizing Provider  doxycycline (VIBRAMYCIN) 100 MG capsule Take 1 capsule (100 mg total) by mouth 2 (two) times daily for 7 days. 09/16/21 09/23/21 Yes Medora Roorda, PA-C  acetaminophen (TYLENOL) 500 MG tablet Take 1,000 mg by mouth every 6 (six) hours as needed (pain).    [provider]  buPROPion (  WELLBUTRIN XL) 150 MG 24 hr tablet Take 1 tablet (150 mg total) by mouth every morning. Patient not taking: No sig reported 04/04/14   Rankin, Shuvon B, NP  clotrimazole (LOTRIMIN) 1 % cream Apply to affected area of your thighs 2 times daily Patient not taking: No sig reported 11/27/14   Janne Napoleon, NP  dicyclomine (BENTYL) 10 MG capsule TAKE 1 CAPSULE BY MOUTH 3 TIMES DAILY WITH MEALS  As needed for abdominal cramping and loose stool. Patient not taking: No sig reported 12/08/14   Gelene Mink, NP  divalproex (DEPAKOTE) 500 MG DR tablet Take 2 tablets (1,000 mg total) by mouth at bedtime. Patient not taking: No sig reported 04/04/14    Rankin, Shuvon B, NP    Allergies    Acetaminophen, Bee pollen, and Shellfish allergy  Review of Systems   Review of Systems  Constitutional:  Negative for chills and fever.  Gastrointestinal:  Negative for abdominal pain, nausea and vomiting.  Genitourinary:  Positive for penile discharge. Negative for penile pain.  Psychiatric/Behavioral:  Positive for dysphoric mood and hallucinations. The patient is not nervous/anxious.   All other systems reviewed and are negative.  Physical Exam Updated Vital Signs BP (!) 133/99 (BP Location: Right Arm)   Pulse 66   Temp 98 F (36.7 C) (Oral)   Resp 18   Ht 6\' 1"  (1.854 m)   Wt 88.9 kg   SpO2 100%   BMI 25.86 kg/m   Physical Exam Vitals and nursing note reviewed.  Constitutional:      Appearance: He is not ill-appearing.  HENT:     Head: Normocephalic and atraumatic.  Eyes:     Conjunctiva/sclera: Conjunctivae normal.  Cardiovascular:     Rate and Rhythm: Normal rate and regular rhythm.     Pulses: Normal pulses.  Pulmonary:     Effort: Pulmonary effort is normal.     Breath sounds: Normal breath sounds. No wheezing, rhonchi or rales.  Abdominal:     Palpations: Abdomen is soft.     Tenderness: There is no abdominal tenderness.  Genitourinary:    Comments: Chaperone present for exam Circumcised penis with thin clear discharge without phimosis/paraphimosis, hypospadias, erythema, tenderness. No rashes or lesions. Testes with no masses or tenderness, no swelling, and cremasterics reflex present bilaterally. No abnormal lie. No inguinal hernias or adenopathy present. Musculoskeletal:     Cervical back: Neck supple.  Skin:    General: Skin is warm and dry.  Neurological:     Mental Status: He is alert.  Psychiatric:        Attention and Perception: He perceives auditory hallucinations.        Speech: Speech is rapid and pressured.        Thought Content: Thought content includes suicidal ideation. Thought content does not  include homicidal ideation. Thought content does not include homicidal or suicidal plan.    ED Results / Procedures / Treatments   Labs (all labs ordered are listed, but only abnormal results are displayed) Labs Reviewed  RAPID URINE DRUG SCREEN, HOSP PERFORMED - Abnormal; Notable for the following components:      Result Value   Tetrahydrocannabinol POSITIVE (*)    All other components within normal limits  RESP PANEL BY RT-PCR (FLU A&B, COVID) ARPGX2  COMPREHENSIVE METABOLIC PANEL  ETHANOL  CBC WITH DIFFERENTIAL/PLATELET  URINALYSIS, ROUTINE W REFLEX MICROSCOPIC  RPR  HIV ANTIBODY (ROUTINE TESTING W REFLEX)  GC/CHLAMYDIA PROBE AMP ([place]) NOT AT [facility]  EKG None  Radiology No results found.  Procedures Procedures   Medications Ordered in ED Medications  cefTRIAXone (ROCEPHIN) injection 500 mg (500 mg Intramuscular Given 09/16/21 0501)  metroNIDAZOLE (FLAGYL) tablet 2,000 mg (2,000 mg Oral Given 09/16/21 0539)  lidocaine (XYLOCAINE) 2 % (with pres) injection (  Given 09/16/21 3016)    ED Course  I have reviewed the triage vital signs and the nursing notes.  Pertinent labs & imaging results that were available during my care of the patient were reviewed by me and considered in my medical decision making (see chart for details).    MDM Rules/Calculators/A&P                           35 year old male who presents to the ED today for psychiatric eval secondary to depression and auditory hallucinations.  Also requesting COVID testing and STD testing.  Has been endorsing some penile discharge.  On arrival to the ED today vitals are stable.  Patient appears to be no acute distress.  Chaperone present for GU exam.  Noted to have thin clear discharge to penis.  No testicular pain or swelling.  Normal cremasteric reflex.  We will plan to test for gonorrhea, chlamydia, HIV, syphilis.  Given he is symptomatic we will go ahead and provide IM Rocephin and Flagyl in the ED  today and plan to discharge home with 7-day course of doxycycline.  Will obtain medical screening labs at this time and consult TTS for further evaluation.   CBC without leukocytosis. Hgb stable at 13.0 U/A negative for any signs of infection.  UDS positive for THC EtOH negative CMP without electrolyte abnormalities   Pt is medically cleared at this time. Pending TTS evaluation. Doxycycline has been sent to pharmacy. Oncoming team to discharge accordingly.   This note was prepared using Dragon voice recognition software and may include unintentional dictation errors due to the inherent limitations of voice recognition software.  Final Clinical Impression(s) / ED Diagnoses Final diagnoses:  Depression, unspecified depression type  Penile discharge    Rx / DC Orders ED Discharge Orders          Ordered    doxycycline (VIBRAMYCIN) 100 MG capsule  2 times daily        09/16/21 0605             Tanda Rockers, PA-C 09/16/21 0109    Dione Booze, MD 09/16/21 [phone]

## 2021-09-16 NOTE — Discharge Instructions (Addendum)
Patient to be transferred over to [facility] ED. Report given to Charge Nurse and EDP.

## 2021-09-16 NOTE — ED Notes (Signed)
Belongings were collected. Patient was changed into burgundy scrubs and wanded by security.

## 2021-09-16 NOTE — ED Notes (Signed)
Pt belongings are with security

## 2021-09-16 NOTE — ED Triage Notes (Signed)
Pt sent here from [facility] for overnight obs. Pt has reported SI, pt has been off depakote for 3 weeks. Pt cooperative in triage, talking loudly, fidgety.

## 2021-09-16 NOTE — Discharge Instructions (Signed)
Please pick up antibiotics and take as prescribed to cover for possible sexually transmitted disease. We have tested you for gonorrhea, chlamydia, HIV, and syphilis. Please refrain from intercourse until you receive your results/have finished your entire course of medication.   If you test positive you will need to let all partners know that they need to be tested and treated prior to engaging in intercourse with them again.   Follow up with your PCP for further evaluation   Return to the ED for any new/worsening symptoms

## 2021-09-16 NOTE — ED Provider Notes (Signed)
Behavioral Health Urgent Care Medical Screening Exam  Patient Name: David Blankenship MRN: [id number] Date of Evaluation: 09/17/21 Chief Complaint:   Diagnosis:  Final diagnoses:  Passive suicidal ideations    History of Present illness: David Blankenship is a 35 y.o. male with a past psychiatric history significant for schizoaffective disorder (bipolar type), attention deficit hyperactivity disorder, and depression who presents to [facility] as a walk-in with a chief complaint of suicidal ideations.  Prior to conducting the assessment with the patient, patient was informed by staff that patient was contacted by his significant other David Blankenship) who is currently admitted in the observation unit at this facility.  Provider was informed that David Blankenship contacted patient and advised him to report that he was suicidal so that he could be admitted in the same facility with her.  Per chart review, patient presented to be health roughly 2 months ago, however, patient does not remember the reason for presenting.  Patient has the following medical conditions: Asthma, bronchitis, arthritis, back pain, and epileptic seizures.  Patient reports that his last seizure occurred last night where he was transported to [facility], ED.  Per chart review, patient presented to [facility], ED with multiple chief complaints but wanted to first and foremost to receive a psychiatric evaluation.  Patient reports that he has been having suicidal thoughts for the past 2 days brought upon by stress and depression.  Patient also reports hearing voices characterized by the voices of David Blankenship, and David Blankenship of the [employer].  He states that the voices tell him to kill himself.  Patient endorses the following depressive symptoms: feelings of sadness, lack of motivation, feelings of guilt/worthlessness, and hopelessness.  She denies decreased concentration, irritability, and crying spells.  He  further endorses elevated anxiety.  Patient stressors include financial instability and the pressures of wanting to do right by his girlfriend.  Patient denies suicidal or homicidal ideation.  He endorses auditory and visual hallucinations.  His visual hallucinations are characterized by seeing dead people and spaces.  He endorses poor sleep and receives on average 2 to 3 hours of intermittent sleep.  He endorses fluctuating appetite.  Patient endorses tobacco use and states that he normally smokes [employer].  He denies alcohol consumption.  He further endorses illicit drug use in the form of marijuana and smokes on average 2 blunts per day.  Patient has a past history of hospitalization but is unsure of the last place he was admitted to due to mental health.  Patient endorses past history of suicide attempt stating that he last attempted 2 years ago via cutting his wrists.  Patient denies a past history of self-harm.  Patient states that he is seeing a therapist by the name of Dr. Drucie Opitz in [place].  Psychiatric Specialty Exam  Presentation  General Appearance:Appropriate for Environment; Casual  Eye Contact:Fair  Speech:Clear and Coherent; Normal Rate  Speech Volume:Normal Handedness:Right  Mood and Affect  Mood:Anxious; Irritable; Depressed Affect:Congruent  Thought Process  Thought Processes:Coherent; Goal Directed Descriptions of Associations:Intact Orientation:No data recorded Thought Content:Illogical Diagnosis of Schizophrenia or Schizoaffective disorder in past: Yes  Duration of Psychotic Symptoms: Less than six months  Hallucinations:Auditory; Visual Patient endorses auditory hallucinations characterized by the voices of David Blankenship, and David Blankenship telling him to harm himself Patient endorses visual hallucinations characterized by seeing dead people and spaces Ideas of Reference:Paranoia Suicidal Thoughts:Yes, Passive Without Intent; Without Plan Homicidal  Thoughts:No  Sensorium  Memory:Immediate Good; Recent  Good; Remote Good Judgment:Fair Insight:Fair  Executive Functions  Concentration:Fair Attention Span:Fair Recall:Fair Fund of Knowledge:Fair Language:Good  Psychomotor Activity  Psychomotor Activity:Normal  Assets  Assets:Communication Skills; Desire for Improvement; Housing; Social Support  Sleep  Sleep:Poor Number of hours: No data recorded  No data recorded  Physical Exam: Physical Exam Psychiatric:        Attention and Perception: Attention normal. He perceives auditory and visual hallucinations.        Mood and Affect: Affect normal. Mood is anxious and depressed.        Speech: Speech normal.        Behavior: Behavior normal. Behavior is cooperative.        Thought Content: Thought content is not paranoid. Thought content includes suicidal ideation. Thought content does not include homicidal ideation. Thought content does not include suicidal plan.        Cognition and Memory: Cognition and memory normal.        Judgment: Judgment normal.   Review of Systems  Psychiatric/Behavioral:  Positive for depression, hallucinations and suicidal ideas. Negative for substance abuse. The patient is nervous/anxious. The patient does not have insomnia.   Blood pressure (!) 158/74, pulse 75, temperature 98 F (36.7 C), resp. rate 18, SpO2 99 %. There is no height or weight on file to calculate BMI.  Musculoskeletal: Strength & Muscle Tone: within normal limits Gait & Station: normal Patient leans: N/A   [facility] MSE Discharge Disposition for Follow up and Recommendations: Based on my evaluation the patient appears to have an emergency medical condition for which I recommend the patient be transferred to the emergency department for further evaluation.  As mentioned in the HPI, patient was contacted by his girlfriend in an attempt to have him admitted into the same facility as her.  Provider was informed by staff that patient's  girlfriend advised him to say that he was suicidal as a means to be admitted to this facility.  During the assessment, patient endorsed passive suicidal ideations with no specific plan in place.  Patient was informed that he was unable to be admitted to the facility due to his significant other also being admitted to this facility.  It should also be noted that patient's girlfriend mentioned in the previous day that she had homicidal ideations towards this patient.  Provider to contact [facility], ED to give report to charge nurse and EDP that the patient can be transferred to an appropriate facility.  Report given to Dr. Particia Nearing (EDP) and Elliot Gurney Engineer, manufacturing systems).  Patient accepted to [facility], ED.  EMTALA completed prior to transfer to accepting facility.   Meta Hatchet, PA 09/17/2021, 1:01 AM

## 2021-09-16 NOTE — ED Triage Notes (Signed)
Pt BIB [facility] requesting "STD testing, COVID testing, a full work up, to go the behavioral health hospital and to be discharged to a group home."

## 2021-09-17 NOTE — ED Notes (Signed)
Patient to bed 50. Awaiting TTS for disposition. Patient calm and cooperative at this time.

## 2021-09-17 NOTE — Consult Note (Signed)
Sent message to Jessy Oto, NT requesting to speak with patient for psychiatry reassessment.  Did not receive a response.  Nurse status was listed as, "busy."  Attempted to call the TTS cart as well but no answer.

## 2021-09-17 NOTE — ED Notes (Signed)
Dinner is served

## 2021-09-17 NOTE — ED Notes (Signed)
Patient resting quietly at this time. Will continue to monitor

## 2021-09-17 NOTE — ED Notes (Signed)
Breakfast Ordered 

## 2021-09-17 NOTE — ED Notes (Signed)
Patient resting quietly on stretcher in hallway.  Respirations even and unlabored

## 2021-09-18 ENCOUNTER — Encounter (HOSPITAL_COMMUNITY): Payer: Self-pay | Admitting: Registered Nurse

## 2021-09-18 DIAGNOSIS — R45851 Suicidal ideations: Secondary | ICD-10-CM

## 2021-09-18 DIAGNOSIS — Z765 Malingerer [conscious simulation]: Secondary | ICD-10-CM

## 2021-09-18 DIAGNOSIS — F1210 Cannabis abuse, uncomplicated: Secondary | ICD-10-CM | POA: Diagnosis not present

## 2021-09-18 DIAGNOSIS — F250 Schizoaffective disorder, bipolar type: Secondary | ICD-10-CM

## 2021-09-18 DIAGNOSIS — F1010 Alcohol abuse, uncomplicated: Secondary | ICD-10-CM

## 2021-09-18 NOTE — ED Notes (Signed)
Provided with soda drink

## 2021-09-18 NOTE — Progress Notes (Addendum)
   09/18/21 1243  Legal Guardian  Does Patient Have a Court Appointed Legal Guardian? No    LCSW Note   09/18/2021    1:36 PM   Type of Contact and Topic: Guardian Contact  CSW Contacted Dorris Hairstrom (who is listed as patient's guardian in Epic) who was accompanied by Merlene Morse. Both Dorris and Bjorn Loser deny they have guardianship of the patient. Document list in Epic reviewed, no evidence of guardianship found.  States they simply help him because he is "slow" as they also described his mother and father. Further states that he was left to the care of Bjorn Loser when he was left by his mother as a minor, though they report guardianship did not pursue into adulthood.    Signed:  Corky Crafts, MSW, LCSWA, LCAS 09/18/2021 1:36 PM

## 2021-09-18 NOTE — Discharge Instructions (Addendum)
For your behavioral health needs you are advised to follow up with [facility] at your earliest opportunity:      [facility]      [address]      [place], [place] [address]      [phone] They offer psychiatry/medication management and therapy.  New patients are seen in their walk-in clinic.  Walk-in hours are Monday - Thursday from 8:00 am - 11:00 am for psychiatry, and Friday from 1:00 pm - 4:00 pm for therapy.  Walk-in patients are seen on a first come, first served basis, so try to arrive as early as possible for the best chance of being seen the same day.  Please note that to be eligible for services you must bring an ID or a piece of mail with your name and a [facility] address.  Other options include:  [facility]  [address] [place], [place] [address] Phone: [phone]  [facility]  [address]  [place], [place] [address]  [facility]  [address]  [place], [place] [address] Phone: [phone]

## 2021-09-18 NOTE — ED Notes (Signed)
Pt w/ excessively loud verbalization and pressured speech at this time.

## 2021-09-18 NOTE — ED Provider Notes (Signed)
Emergency Medicine Observation Re-evaluation Note  David Blankenship is a 35 y.o. male, seen on rounds today.  Pt initially presented to the ED for complaints of Suicidal Currently, the patient is stable.  Physical Exam  BP 109/69   Pulse (!) 57   Temp 97.9 F (36.6 C) (Oral)   Resp 17   Ht 6\' 1"  (1.854 m)   Wt 81.6 kg   SpO2 99%   BMI 23.75 kg/m  Physical Exam General: No distress, mild agitation earlier without need for medication Cardiac: Well perfused Lungs: even and unlabored Psych: No current agitation  ED Course / MDM  EKG:EKG Interpretation  Date/Time:  Saturday September 16 2021 23:11:00 EST Ventricular Rate:  58 PR Interval:  156 QRS Duration: 90 QT Interval:  416 QTC Calculation: 408 R Axis:   87 Text Interpretation: Sinus bradycardia ST elevation, consider early repolarization, pericarditis, or injury Abnormal ECG No significant change since last tracing Confirmed by [id number] (Lorre Nick) on 09/17/2021 8:21:37 AM  I have reviewed the labs performed to date as well as medications administered while in observation.  Recent changes in the last 24 hours include patient evaluated by psychiatry and cleared for outpatient management.  Plan  Current plan is for discharge.  David Blankenship is not under involuntary commitment.     [name], MD 09/18/21 1139

## 2021-09-18 NOTE — ED Notes (Signed)
Discharge instructions reviewed with patient . Patient verbalized understanding of instructions. Follow-up care was reviewed. Patient ambulatory with steady gait. VSS upon discharge.  

## 2021-09-18 NOTE — Consult Note (Signed)
Telepsych Consultation   Reason for Consult:  Suicidal ideation Referring Physician:  Godfrey Pick, MD Location of Patient: [facility] ED Location of Provider: [facility]  Patient Identification: David Blankenship MRN:  [id number] Principal Diagnosis: Malingering Diagnosis:  Principal Problem:   Malingering Active Problems:   Schizoaffective disorder, bipolar type ([place])   Alcohol abuse   Cannabis abuse   Suicidal ideation   Total Time spent with patient: 30 minutes  Subjective:   David Blankenship is a 35 y.o. male patient admitted to [facility] ED after he was sent from [facility] where he initially present with complaints that his significant other was admitted there and had called and wanted him to come get admitted to so they would be there together; then stating that he was suicidal..  HPI:  David Blankenship, 35 y.o., male patient seen via tele health by this provider, consulted with Dr. Ernie Hew; and chart reviewed on 09/18/21.  On evaluation David Blankenship reports he came to urgent care because he was feeling suicidal and depressed but he is feeling much better now and is having no thoughts of wanting to hurt or kill himself or anyone else.  When asked if he was having suicidal [place] thoughts patient states "No mam, no mama, no mama."  This is repeated when questioning about homicidal ideation, auditory/visual hallucinations, and paranoia.  Patient reports he is sleeping and eating "good".  Patient reports he gets his prescriptions filled by doctor. At [facility] During evaluation David Blankenship is in no acute distress.  He is alert, oriented x 4, calm and cooperative.  His/mood is euthymic with congruent affect.  He does not appear to be responding to internal/external stimuli or delusional thoughts.  Patient denies suicidal/self-harm/homicidal ideation, psychosis, and paranoia.  Patient answered question appropriately.   Past Psychiatric History:  See below  Risk to Self:   Risk to Others:   Prior Inpatient Therapy:   Prior Outpatient Therapy:    Past Medical History:  Past Medical History:  Diagnosis Date   ADHD (attention deficit hyperactivity disorder)    Asthma    Bipolar 1 disorder ([place])    Depression    Schizophrenia ([place])    Seizures ([place])     Past Surgical History:  Procedure Laterality Date   COLONOSCOPY WITH ESOPHAGOGASTRODUODENOSCOPY (EGD)  10/28/2012   [id number] tiny distal esophageal erosions consistent with mild erosive reflux esophagitis. Hiatal hernia. 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 no h.pylori   NO PAST SURGERIES     none     Family History:  Family History  Problem Relation Age of Onset   Seizures Father    Asthma Other    Seizures Other    Cancer Other        aunt   Family Psychiatric  History: None reported Social History:  Social History   Substance and Sexual Activity  Alcohol Use Yes   Alcohol/week: 35.0 standard drinks   Types: 35 Cans of beer per week   Comment: daily 5-6 beers      Social History   Substance and Sexual Activity  Drug Use Yes   Types: Marijuana, Cocaine    Social History   Socioeconomic History   Marital status: Single    Spouse name: Not on file   Number of children: Not on file   Years of education: Not on file   Highest  education level: Not on file  Occupational History   Not on file  Tobacco Use   Smoking status: Every Day    Packs/day: 0.50    Years: 12.00    Pack years: 6.00    Types: Cigarettes   Smokeless tobacco: Never   Tobacco comments:    5 or 6 cigarettes daily  Vaping Use   Vaping Use: Never used  Substance and Sexual Activity   Alcohol use: Yes    Alcohol/week: 35.0 standard drinks    Types: 35 Cans of beer per week    Comment: daily 5-6 beers    Drug use: Yes    Types:  Marijuana, Cocaine   Sexual activity: Yes    Birth control/protection: Condom  Other Topics Concern   Not on file  Social History Narrative   Not on file   Social Determinants of Health   Financial Resource Strain: Not on file  Food Insecurity: Not on file  Transportation Needs: Not on file  Physical Activity: Not on file  Stress: Not on file  Social Connections: Not on file   Additional Social History:    Allergies:   Allergies  Allergen Reactions   Acetaminophen Hives    Pt reports that he takes tylenol but it was previously reported as an allergy.   Bee Pollen Hives   Shellfish Allergy Hives    Labs: No results found for this or any previous visit (from the past 48 hour(s)).  Medications:  No current facility-administered medications for this encounter.   Current Outpatient Medications  Medication Sig Dispense Refill   divalproex (DEPAKOTE) 500 MG DR tablet Take 1,500 mg by mouth at bedtime.     haloperidol (HALDOL) 10 MG tablet Take 10 mg by mouth at bedtime.     buPROPion (WELLBUTRIN XL) 150 MG 24 hr tablet Take 1 tablet (150 mg total) by mouth every morning. (Patient not taking: Reported on 03/11/2021) 30 tablet 1   clotrimazole (LOTRIMIN) 1 % cream Apply to affected area of your thighs 2 times daily (Patient not taking: Reported on 03/11/2021) 15 g 0   dicyclomine (BENTYL) 10 MG capsule TAKE 1 CAPSULE BY MOUTH 3 TIMES DAILY WITH MEALS  As needed for abdominal cramping and loose stool. (Patient not taking: Reported on 03/11/2021) 120 capsule 3   divalproex (DEPAKOTE) 500 MG DR tablet Take 2 tablets (1,000 mg total) by mouth at bedtime. (Patient not taking: Reported on 03/11/2021) 60 tablet 1   doxycycline (VIBRAMYCIN) 100 MG capsule Take 1 capsule (100 mg total) by mouth 2 (two) times daily for 7 days. 14 capsule 0    Musculoskeletal: Strength & Muscle Tone: within normal limits Gait & Station: normal Patient leans: N/A          Psychiatric Specialty  Exam:  Presentation  General Appearance: Appropriate for Environment  Eye Contact:Good  Speech:Clear and Coherent; Normal Rate  Speech Volume:Normal  Handedness:Right   Mood and Affect  Mood:Euthymic  Affect:Appropriate; Congruent   Thought Process  Thought Processes:Coherent; Goal Directed  Descriptions of Associations:Intact  Orientation:Full (Time, Place and Person)  Thought Content:WDL  History of Schizophrenia/Schizoaffective disorder:Yes  Duration of Psychotic Symptoms:Greater than six months  Hallucinations:Hallucinations: None  Ideas of Reference:None  Suicidal Thoughts:Suicidal Thoughts: No  Homicidal Thoughts:Homicidal Thoughts: No   Sensorium  Memory:Immediate Good; Recent Good; Remote Good  Judgment:Intact  Insight:Present   Executive Functions  Concentration:Good  Attention Span:Good  [place] of Knowledge:Good  Language:Good   Psychomotor Activity  Psychomotor Activity:Psychomotor  Activity: Normal   Assets  Assets:Communication Skills; Desire for Improvement; Social Support; Housing   Sleep  Sleep:Sleep: Good    Physical Exam: Physical Exam Vitals and nursing note reviewed. Exam conducted with a chaperone present.  Constitutional:      General: He is not in acute distress.    Appearance: Normal appearance. He is not ill-appearing.  Cardiovascular:     Rate and Rhythm: Normal rate.  Pulmonary:     Effort: Pulmonary effort is normal.  Neurological:     Mental Status: He is alert and oriented to person, place, and time.  Psychiatric:        Attention and Perception: Attention and perception normal. He does not perceive visual hallucinations.        Mood and Affect: Mood and affect normal.        Speech: Speech normal.        Behavior: Behavior normal. Behavior is cooperative.        Thought Content: Thought content normal. Thought content is not paranoid or delusional. Thought content does not include  homicidal or suicidal ideation.        Cognition and Memory: Cognition and memory normal.        Judgment: Judgment normal.   Review of Systems  Constitutional: Negative.   HENT: Negative.    Eyes: Negative.   Respiratory: Negative.    Cardiovascular: Negative.   Gastrointestinal: Negative.   Genitourinary: Negative.   Musculoskeletal: Negative.   Skin: Negative.   Neurological: Negative.   Endo/Heme/Allergies: Negative.   Psychiatric/Behavioral:  Depression: Stable. Hallucinations: enies. Substance abuse: HC. Suicidal ideas: enies. The patient does not have insomnia. Nervous/anxious: Stable.  Blood pressure 109/69, pulse (!) 57, temperature 97.9 F (36.6 C), temperature source Oral, resp. rate 17, height 6\' 1"  (1.854 m), weight 81.6 kg, SpO2 99 %. Body mass index is 23.75 kg/m.  Treatment Plan Summary: Plan psychiatrically cleared to follow-up with outpatient psychiatric provider.  Disposition: No evidence of imminent risk to self or others at present.   Patient does not meet criteria for psychiatric inpatient admission. Supportive therapy provided about ongoing stressors. Discussed crisis plan, support from social network, calling 911, coming to the Emergency Department, and calling Suicide Hotline.  This service was provided via telemedicine using a 2-way, interactive audio and video technology.  Names of all persons participating in this telemedicine service and their role in this encounter. Name: Role: NP  Name: Dr. Assunta Found Role: Psychiatrist  Name: Earlene Plater Role: Patient  Name:  Role:    Secure message sent to patient's nurse Amedeo Plenty, RN informing:  Patient psychiatric consult completed.  Patient psychiatric cleared to follow up with psychiatric provider.  Please inform MD only default listed.   Evora Schechter, NP 09/18/2021 10:18 AM

## 2021-09-18 NOTE — ED Notes (Signed)
Breakfast orders placed 

## 2021-09-18 NOTE — BH Assessment (Signed)
[facility] Assessment Progress Note   Per Shuvon Rankin,, NP, this pt does not require psychiatric hospitalization at this time.  Pt is psychiatrically cleared.  Discharge instructions include referral information for [facility].  Pt's nurses, Clara and Irving Burton, have been notified.  Doylene Canning, MA Triage Specialist [phone]

## 2021-09-19 ENCOUNTER — Telehealth (HOSPITAL_COMMUNITY): Payer: Self-pay | Admitting: Physician Assistant

## 2021-09-19 NOTE — BH Assessment (Signed)
Care Management - Follow Up Discharges   Writer attempted to make contact with patient today and was unsuccessful.  Writer left a HIPPA compliant voice message.   Per chart review, patient was provided with outpatient resources.   

## 2021-09-20 ENCOUNTER — Other Ambulatory Visit: Payer: Self-pay

## 2021-09-20 ENCOUNTER — Encounter (HOSPITAL_COMMUNITY): Payer: Self-pay

## 2021-09-20 ENCOUNTER — Emergency Department (HOSPITAL_COMMUNITY)
Admission: EM | Admit: 2021-09-20 | Discharge: 2021-09-20 | Disposition: A | Discharge status: Home / Self Care | Payer: Medicaid Other | Attending: Emergency Medicine | Admitting: Emergency Medicine

## 2021-09-20 DIAGNOSIS — J45909 Unspecified asthma, uncomplicated: Secondary | ICD-10-CM | POA: Insufficient documentation

## 2021-09-20 DIAGNOSIS — F17210 Nicotine dependence, cigarettes, uncomplicated: Secondary | ICD-10-CM | POA: Diagnosis not present

## 2021-09-20 DIAGNOSIS — Z79899 Other long term (current) drug therapy: Secondary | ICD-10-CM | POA: Insufficient documentation

## 2021-09-20 DIAGNOSIS — Z20822 Contact with and (suspected) exposure to covid-19: Secondary | ICD-10-CM | POA: Insufficient documentation

## 2021-09-20 DIAGNOSIS — Z711 Person with feared health complaint in whom no diagnosis is made: Secondary | ICD-10-CM | POA: Insufficient documentation

## 2021-09-20 DIAGNOSIS — M791 Myalgia, unspecified site: Secondary | ICD-10-CM | POA: Insufficient documentation

## 2021-09-20 LAB — RESP PANEL BY RT-PCR (FLU A&B, COVID) ARPGX2
Influenza A by PCR: NEGATIVE
Influenza B by PCR: NEGATIVE
SARS Coronavirus 2 by RT PCR: NEGATIVE

## 2021-09-20 NOTE — ED Triage Notes (Signed)
Pt BIB EMS from home. Pt states that he has pain all over and thinks that he has bone cancer.

## 2021-09-20 NOTE — ED Provider Notes (Signed)
[facility]-EMERGENCY DEPT Provider Note   CSN: [id number] Arrival date & time: 09/20/21  0302     History Chief Complaint  Patient presents with   Generalized Body Aches    EVO ADERMAN is a 35 y.o. male.  35 year old male with past medical history of schizophrenia, bipolar, ADHD and asthma presents with multiple medical complaints.  Patient states that he was dating a girl who was sexually active with a homeless man and he is concerned he has contracted an STD.  Patient denies dysuria or urethral discharge, testicular pain or swelling.States that he is tired, does not sleep well and has generalized body aches and is unable to masturbate.      Past Medical History:  Diagnosis Date   ADHD (attention deficit hyperactivity disorder)    Asthma    Bipolar 1 disorder (H[facility])    Depression    Schizophrenia ([facility])    Seizures ([facility])     Patient Active Problem List   Diagnosis Date Noted   Malingering 09/18/2021   Suicidal ideation 09/18/2021   Abdominal pain, right upper quadrant 07/15/2014   IBS (irritable bowel syndrome) 06/11/2014   Unspecified constipation 04/20/2013   Anemia 02/04/2013   Thrombocytopenia, unspecified (HCC) 02/04/2013   Gastro-esophageal reflux disease with esophagitis 02/04/2013   Alcohol abuse 12/26/2011   Cannabis abuse 12/26/2011   Schizoaffective disorder, bipolar type (HCC) 12/25/2011    Past Surgical History:  Procedure Laterality Date   COLONOSCOPY WITH ESOPHAGOGASTRODUODENOSCOPY (EGD)  10/28/2012   [id number] tiny distal esophageal erosions consistent with mild erosive reflux esophagitis. Hiatal hernia. 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 no h.pylori   NO PAST SURGERIES     none         Family History  Problem Relation Age of Onset   Seizures Father    Asthma Other     Seizures Other    Cancer Other        aunt    Social History   Tobacco Use   Smoking status: Every Day    Packs/day: 0.50    Years: 12.00    Pack years: 6.00    Types: Cigarettes   Smokeless tobacco: Never   Tobacco comments:    5 or 6 cigarettes daily  Vaping Use   Vaping Use: Never used  Substance Use Topics   Alcohol use: Yes    Alcohol/week: 35.0 standard drinks    Types: 35 Cans of beer per week    Comment: daily 5-6 beers    Drug use: Yes    Types: Marijuana, Cocaine    Home Medications Prior to Admission medications   Medication Sig Start Date End Date Taking? Authorizing Provider  buPROPion (WELLBUTRIN XL) 150 MG 24 hr tablet Take 1 tablet (150 mg total) by mouth every morning. Patient not taking: Reported on 03/11/2021 04/04/14   Rankin, Shuvon B, NP  clotrimazole (LOTRIMIN) 1 % cream Apply to affected area of your thighs 2 times daily Patient not taking: Reported on 03/11/2021 11/27/14   Janne Napoleon, NP  dicyclomine (BENTYL) 10 MG capsule TAKE 1 CAPSULE BY MOUTH 3 TIMES DAILY WITH MEALS  As needed for abdominal cramping and loose stool. Patient not taking: Reported on 03/11/2021 12/08/14   Gelene Mink, NP  divalproex (DEPAKOTE) 500 MG DR tablet Take 2 tablets (1,000 mg total) by mouth at bedtime. Patient not taking: Reported on 03/11/2021 04/04/14  Rankin, Shuvon B, NP  divalproex (DEPAKOTE) 500 MG DR tablet Take 1,500 mg by mouth at bedtime.    [provider]  doxycycline (VIBRAMYCIN) 100 MG capsule Take 1 capsule (100 mg total) by mouth 2 (two) times daily for 7 days. 09/16/21 09/23/21  Tanda Rockers, PA-C  haloperidol (HALDOL) 10 MG tablet Take 10 mg by mouth at bedtime.    [provider]    Allergies    Acetaminophen, Bee pollen, and Shellfish allergy  Review of Systems   Review of Systems  Constitutional:  Positive for fatigue. Negative for fever.  Respiratory:  Negative for shortness of breath.   Cardiovascular:  Negative for chest  pain.  Gastrointestinal:  Negative for abdominal pain.  Genitourinary:  Negative for penile discharge, penile swelling, scrotal swelling and testicular pain.  Musculoskeletal:  Positive for myalgias.  Skin:  Negative for rash and wound.  Allergic/Immunologic: Negative for immunocompromised state.  Psychiatric/Behavioral:  Positive for sleep disturbance. Negative for suicidal ideas.    Physical Exam Updated Vital Signs BP (!) 137/106   Pulse 63   Temp 98.1 F (36.7 C) (Oral)   Resp 18   SpO2 100%   Physical Exam Vitals and nursing note reviewed.  Constitutional:      General: He is not in acute distress.    Appearance: He is well-developed. He is not diaphoretic.  HENT:     Head: Normocephalic and atraumatic.  Cardiovascular:     Rate and Rhythm: Normal rate and regular rhythm.     Heart sounds: Normal heart sounds.  Pulmonary:     Effort: Pulmonary effort is normal.     Breath sounds: Normal breath sounds.  Abdominal:     Palpations: Abdomen is soft.     Tenderness: There is no abdominal tenderness.  Skin:    General: Skin is warm and dry.     Findings: No erythema or rash.  Neurological:     Mental Status: He is alert and oriented to person, place, and time.  Psychiatric:        Mood and Affect: Mood is anxious. Affect is not angry.        Speech: Speech is rapid and pressured.        Behavior: Behavior is not aggressive or hyperactive.        Thought Content: Thought content is paranoid. Thought content does not include homicidal or suicidal ideation.    ED Results / Procedures / Treatments   Labs (all labs ordered are listed, but only abnormal results are displayed) Labs Reviewed  RESP PANEL BY RT-PCR (FLU A&B, COVID) [id number]    EKG None  Radiology No results found.  Procedures Procedures   Medications Ordered in ED Medications - No data to display  ED Course  I have reviewed the triage vital signs and the nursing notes.  Pertinent labs & imaging  results that were available during my care of the patient were reviewed by me and considered in my medical decision making (see chart for details).  Clinical Course as of 09/20/21 0902  Wed Sep 20, 2021  5555 34 year old male with multiple medical complaints as listed above.  Reviewed labs from his visit to the emergency room 4 days ago with patient.  He is negative for HIV, syphilis, his CBC and CMP are reassuring.  His gonorrhea and Chlamydia test has been sent out and is pending results.  His urinalysis was normal.  Offered reassurance.  Encourage patient to follow with his primary  care provider for his med management.  Also given outpatient resource list for his medication and psychiatric management. [LM]    Clinical Course User Index [LM] Alden Hipp   MDM Rules/Calculators/A&P                           Final Clinical Impression(s) / ED Diagnoses Final diagnoses:  Feared complaint without diagnosis    Rx / DC Orders ED Discharge Orders     None        Alden Hipp 09/20/21 2956    Linwood Dibbles, MD 09/20/21 1827

## 2021-09-20 NOTE — Discharge Instructions (Signed)
It is important to follow-up with your primary care provider for your medication management.  Please call the number listed to schedule an appointment.

## 2021-11-26 ENCOUNTER — Encounter (HOSPITAL_COMMUNITY): Payer: Self-pay | Admitting: Emergency Medicine

## 2021-11-26 ENCOUNTER — Emergency Department (HOSPITAL_COMMUNITY)
Admission: EM | Admit: 2021-11-26 | Discharge: 2021-11-26 | Disposition: A | Discharge status: Home / Self Care | Payer: Medicaid Other | Attending: Emergency Medicine | Admitting: Emergency Medicine

## 2021-11-26 ENCOUNTER — Other Ambulatory Visit: Payer: Self-pay

## 2021-11-26 DIAGNOSIS — F419 Anxiety disorder, unspecified: Secondary | ICD-10-CM | POA: Diagnosis not present

## 2021-11-26 DIAGNOSIS — F1210 Cannabis abuse, uncomplicated: Secondary | ICD-10-CM | POA: Insufficient documentation

## 2021-11-26 DIAGNOSIS — Z9114 Patient's other noncompliance with medication regimen: Secondary | ICD-10-CM | POA: Diagnosis not present

## 2021-11-26 MED ORDER — HALOPERIDOL 5 MG PO TABS
10.0000 mg | ORAL_TABLET | Freq: Every day | ORAL | Status: DC
  Administered 2021-11-26: 10 mg via ORAL
  Filled 2021-11-26: qty 2

## 2021-11-26 MED ORDER — ZIPRASIDONE MESYLATE 20 MG IM SOLR
10.0000 mg | Freq: Once | INTRAMUSCULAR | Status: AC
  Administered 2021-11-26: 10 mg via INTRAMUSCULAR
  Filled 2021-11-26: qty 20

## 2021-11-26 MED ORDER — HALOPERIDOL 10 MG PO TABS: 10.0000 mg | ORAL_TABLET | Freq: Every day | ORAL | 0 refills | Status: DC

## 2021-11-26 MED ORDER — STERILE WATER FOR INJECTION IJ SOLN
INTRAMUSCULAR | Status: AC
  Administered 2021-11-26: 2.1 mL
  Filled 2021-11-26: qty 10

## 2021-11-26 MED ORDER — LORAZEPAM 2 MG/ML IJ SOLN
1.0000 mg | Freq: Once | INTRAMUSCULAR | Status: AC
  Administered 2021-11-26: 1 mg via INTRAMUSCULAR
  Filled 2021-11-26: qty 1

## 2021-11-26 MED ORDER — DIVALPROEX SODIUM 500 MG PO DR TAB: 1500.0000 mg | DELAYED_RELEASE_TABLET | Freq: Every day | ORAL | 0 refills | Status: DC

## 2021-11-26 MED ORDER — DIVALPROEX SODIUM 500 MG PO DR TAB
1500.0000 mg | DELAYED_RELEASE_TABLET | Freq: Every day | ORAL | Status: DC
  Administered 2021-11-26: 1500 mg via ORAL
  Filled 2021-11-26: qty 3

## 2021-11-26 NOTE — ED Triage Notes (Signed)
Patient presents after running out of his medications perhaps 2 days ago. He reports smoking marijuana earlier today and is now manic. He reported to EMS that he feels as if he is having brain seizures.  Hx: Bipolar, Schizophrenia   EMS vitals: 148/100 BP 85 CBG 62 HR 16 RR 98% SPO2 on room air

## 2021-11-26 NOTE — Discharge Instructions (Signed)
Follow-up for further care and treatment with your psychiatrist and medical provider

## 2021-11-26 NOTE — ED Provider Notes (Signed)
[facility]-EMERGENCY DEPT Provider Note   CSN: [id number] Arrival date & time: 11/26/21  1846     History  No chief complaint on file.   David Blankenship is a 36 y.o. male.  HPI Patient states he smokes marijuana little while ago and again [place] and made him anxious.  He also feels that he is having brain seizures after stopping his medications, 2 days ago.  He states he ran out of his medicine.    Home Medications Prior to Admission medications   Medication Sig Start Date End Date Taking? Authorizing Provider  buPROPion (WELLBUTRIN XL) 150 MG 24 hr tablet Take 1 tablet (150 mg total) by mouth every morning. Patient not taking: Reported on 03/11/2021 04/04/14   Rankin, Shuvon B, NP  clotrimazole (LOTRIMIN) 1 % cream Apply to affected area of your thighs 2 times daily Patient not taking: Reported on 03/11/2021 11/27/14   Janne Napoleon, NP  dicyclomine (BENTYL) 10 MG capsule TAKE 1 CAPSULE BY MOUTH 3 TIMES DAILY WITH MEALS  As needed for abdominal cramping and loose stool. Patient not taking: Reported on 03/11/2021 12/08/14   Gelene Mink, NP  divalproex (DEPAKOTE) 500 MG DR tablet Take 3 tablets (1,500 mg total) by mouth at bedtime. 11/26/21   Mancel Bale, MD  haloperidol (HALDOL) 10 MG tablet Take 1 tablet (10 mg total) by mouth at bedtime. 11/26/21   Mancel Bale, MD      Allergies    Acetaminophen, Bee pollen, and Shellfish allergy    Review of Systems   Review of Systems  Physical Exam Updated Vital Signs BP (!) 146/92    Pulse 72    Temp 97.7 F (36.5 C) (Oral)    Resp 17    SpO2 100%  Physical Exam Vitals and nursing note reviewed.  Constitutional:      Appearance: He is well-developed. He is not ill-appearing.  HENT:     Head: Normocephalic and atraumatic.     Right Ear: External ear normal.     Left Ear: External ear normal.  Eyes:     Conjunctiva/sclera: Conjunctivae normal.     Pupils: Pupils are equal, round, and reactive to light.   Neck:     Trachea: Phonation normal.  Cardiovascular:     Rate and Rhythm: Normal rate.  Pulmonary:     Effort: Pulmonary effort is normal.  Abdominal:     General: There is no distension.     Palpations: Abdomen is soft.  Musculoskeletal:        General: Normal range of motion.     Cervical back: Normal range of motion and neck supple.  Skin:    General: Skin is warm and dry.  Neurological:     Mental Status: He is alert and oriented to person, place, and time.     Cranial Nerves: No cranial nerve deficit.     Sensory: No sensory deficit.     Motor: No abnormal muscle tone.     Coordination: Coordination normal.  Psychiatric:        Attention and Perception: He is inattentive.        Mood and Affect: Mood is anxious. Affect is inappropriate.        Speech: He is communicative.        Behavior: Behavior is not agitated, aggressive, withdrawn, hyperactive or combative.        Cognition and Memory: Cognition is impaired.        Judgment: Judgment  is impulsive and inappropriate.    ED Results / Procedures / Treatments   Labs (all labs ordered are listed, but only abnormal results are displayed) Labs Reviewed - No data to display  EKG None  Radiology No results found.  Procedures Procedures    Medications Ordered in ED Medications  ziprasidone (GEODON) injection 10 mg (10 mg Intramuscular Given 11/26/21 1939)  LORazepam (ATIVAN) injection 1 mg (1 mg Intramuscular Given 11/26/21 1938)  sterile water (preservative free) injection (2.1 mLs  Given 11/26/21 1940)    ED Course/ Medical Decision Making/ A&P Clinical Course as of 11/27/21 1352  Sun Nov 26, 2021  0037 He is requesting "shots," for anxiety since he stopped his medicines 2 days ago and has been smoking marijuana [EW]  2043 Patient reports improvement and requests prescription for Haldol and Depakote to go home with. [EW]    Clinical Course User Index [EW] Mancel Bale, MD                           Medical  Decision Making He presents with somatic symptoms aggravated by smoking marijuana.  He also relates that he is out of his medications for psychiatric illness  Problems Addressed: Marijuana abuse: acute illness or injury    Details: It caused him to have psychiatric symptoms Noncompliance with medication regimen: acute illness or injury    Details: He is unable to specify why he is not taking his medicines or out of them.  Amount and/or Complexity of Data Reviewed Independent Historian:     Details: He is able to give a cogent history  Risk Prescription drug management. Decision regarding hospitalization. Risk Details: The patient's symptoms were controlled by treating him with his regularly prescribed medications.  I was able to refill his currently prescribed medications to help treat his symptoms on an ongoing basis.  He is encouraged to follow-up with his PCP or psychiatrist for further management as needed.  He does not require hospitalization for the current symptoms.           Final Clinical Impression(s) / ED Diagnoses Final diagnoses:  Noncompliance with medication regimen  Marijuana abuse    Rx / DC Orders ED Discharge Orders          Ordered    divalproex (DEPAKOTE) 500 MG DR tablet  Daily at bedtime        11/26/21 2043    haloperidol (HALDOL) 10 MG tablet  Daily at bedtime        11/26/21 2043              Mancel Bale, MD 11/27/21 1354

## 2022-01-06 ENCOUNTER — Encounter (HOSPITAL_COMMUNITY): Payer: Self-pay

## 2022-01-06 ENCOUNTER — Other Ambulatory Visit: Payer: Self-pay

## 2022-01-06 ENCOUNTER — Emergency Department (HOSPITAL_COMMUNITY)
Admission: EM | Admit: 2022-01-06 | Discharge: 2022-01-06 | Disposition: A | Discharge status: Home / Self Care | Payer: Medicaid Other | Attending: Emergency Medicine | Admitting: Emergency Medicine

## 2022-01-06 DIAGNOSIS — J45909 Unspecified asthma, uncomplicated: Secondary | ICD-10-CM | POA: Insufficient documentation

## 2022-01-06 DIAGNOSIS — Z28310 Unvaccinated for covid-19: Secondary | ICD-10-CM | POA: Insufficient documentation

## 2022-01-06 DIAGNOSIS — R519 Headache, unspecified: Secondary | ICD-10-CM | POA: Insufficient documentation

## 2022-01-06 DIAGNOSIS — J069 Acute upper respiratory infection, unspecified: Secondary | ICD-10-CM | POA: Insufficient documentation

## 2022-01-06 DIAGNOSIS — Z760 Encounter for issue of repeat prescription: Secondary | ICD-10-CM | POA: Diagnosis not present

## 2022-01-06 DIAGNOSIS — Z79899 Other long term (current) drug therapy: Secondary | ICD-10-CM | POA: Diagnosis not present

## 2022-01-06 DIAGNOSIS — Z20822 Contact with and (suspected) exposure to covid-19: Secondary | ICD-10-CM | POA: Diagnosis not present

## 2022-01-06 DIAGNOSIS — M791 Myalgia, unspecified site: Secondary | ICD-10-CM | POA: Diagnosis not present

## 2022-01-06 DIAGNOSIS — R109 Unspecified abdominal pain: Secondary | ICD-10-CM | POA: Insufficient documentation

## 2022-01-06 DIAGNOSIS — R112 Nausea with vomiting, unspecified: Secondary | ICD-10-CM | POA: Diagnosis not present

## 2022-01-06 DIAGNOSIS — R059 Cough, unspecified: Secondary | ICD-10-CM | POA: Diagnosis present

## 2022-01-06 LAB — URINALYSIS, ROUTINE W REFLEX MICROSCOPIC
Bilirubin Urine: NEGATIVE
Glucose, UA: NEGATIVE mg/dL
Hgb urine dipstick: NEGATIVE
Ketones, ur: NEGATIVE mg/dL
Leukocytes,Ua: NEGATIVE
Nitrite: NEGATIVE
Protein, ur: NEGATIVE mg/dL
Specific Gravity, Urine: 1.012 (ref 1.005–1.030)
pH: 7 (ref 5.0–8.0)

## 2022-01-06 LAB — RESP PANEL BY RT-PCR (FLU A&B, COVID) ARPGX2
Influenza A by PCR: NEGATIVE
Influenza B by PCR: NEGATIVE
SARS Coronavirus 2 by RT PCR: NEGATIVE

## 2022-01-06 MED ORDER — IBUPROFEN 200 MG PO TABS
600.0000 mg | ORAL_TABLET | Freq: Once | ORAL | Status: AC
  Administered 2022-01-06: 600 mg via ORAL
  Filled 2022-01-06: qty 3

## 2022-01-06 NOTE — Discharge Instructions (Addendum)
Likely a viral infection, recommend over-the-counter pain medications like ibuprofen Tylenol for fever and pain control, nasal decongestions like Flonase and Zyrtec, Mucinex for cough.  If not eating recommend supplementing with Gatorade to help with electrolyte supplementation. ? ?Follow-up PCP for further evaluation. ? ?Please follow-up with the [facility] for your Haldol refills. ? ?Come back to the emergency department if you develop chest pain, shortness of breath, severe abdominal pain, uncontrolled nausea, vomiting, diarrhea. ? ?

## 2022-01-06 NOTE — ED Provider Notes (Signed)
?[facility] DEPT ?Provider Note ? ? ?CSN: [id number] ?Arrival date & time: 01/06/22  0106 ? ?  ? ?History ? ?No chief complaint on file. ? ? ?David Blankenship is a 36 y.o. male. ? ?HPI ? ?Patient with medical history including asthma, bipolar, seizures, schizophrenia presents with complaints of medication refill as well as URI-like symptoms.  Patient states URI-like symptoms started about 3 days ago, endorses headaches, fevers, chills, nonproductive cough, stomach pains, nausea vomiting and general body aches.  He is not immunocompromise, denies any recent sick contacts, is not vaccinated against COVID or influenza.  He states that he vomited once yesterday and has not vomited since, states he is tolerating p.o., he currently has no stomach pain at this time.  He denies any chest pain or shortness of breath, no pleuritic chest pain, no leg pain.  He also notes that he is out of his Haldol, states that he was seen here a month ago they refill that and now he is out of it he states that he has been getting this since he got out of prison and does not have a psychiatrist. ? ?I reviewed patient's chart he was seen 1 month ago for medication refill patient was given a months worth of Haldol, it appears patient has not established with a psychiatrist. ? ?Home Medications ?Prior to Admission medications   ?Medication Sig Start Date End Date Taking? Authorizing Provider  ?divalproex (DEPAKOTE) 500 MG DR tablet Take 3 tablets (1,500 mg total) by mouth at bedtime. 11/26/21  Yes Daleen Bo, MD  ?haloperidol (HALDOL) 10 MG tablet Take 1 tablet (10 mg total) by mouth at bedtime. 11/26/21  Yes Daleen Bo, MD  ?   ? ?Allergies    ?Acetaminophen, Bee pollen, and Shellfish allergy   ? ?Review of Systems   ?Review of Systems  ?Constitutional:  Positive for chills and fever.  ?HENT:  Positive for congestion.   ?Respiratory:  Positive for cough. Negative for shortness of breath.   ?Cardiovascular:   Negative for chest pain.  ?Gastrointestinal:  Negative for abdominal pain.  ?Musculoskeletal:  Positive for myalgias.  ?Neurological:  Negative for headaches.  ? ?Physical Exam ?Updated Vital Signs ?BP (!) 142/98   Pulse 60   Temp 98.2 ?F (36.8 ?C) (Oral)   Resp 18   Ht 6\' 1"  (1.854 m)   Wt 97.5 kg   SpO2 100%   BMI 28.37 kg/m?  ?Physical Exam ?Vitals and nursing note reviewed.  ?Constitutional:   ?   General: He is not in acute distress. ?   Appearance: He is not ill-appearing.  ?HENT:  ?   Head: Normocephalic and atraumatic.  ?   Right Ear: Tympanic membrane, ear canal and external ear normal.  ?   Left Ear: Tympanic membrane, ear canal and external ear normal.  ?   Nose: Congestion present.  ?   Mouth/Throat:  ?   Mouth: Mucous membranes are moist.  ?   Pharynx: Oropharynx is clear. No oropharyngeal exudate or posterior oropharyngeal erythema.  ?Eyes:  ?   Conjunctiva/sclera: Conjunctivae normal.  ?Cardiovascular:  ?   Rate and Rhythm: Normal rate and regular rhythm.  ?   Pulses: Normal pulses.  ?   Heart sounds: No murmur heard. ?  No friction rub. No gallop.  ?Pulmonary:  ?   Effort: No respiratory distress.  ?   Breath sounds: No wheezing, rhonchi or rales.  ?Abdominal:  ?   Palpations: Abdomen is soft.  ?  Tenderness: There is no abdominal tenderness. There is no right CVA tenderness or left CVA tenderness.  ?Musculoskeletal:  ?   Right lower leg: No edema.  ?   Left lower leg: No edema.  ?Skin: ?   General: Skin is warm and dry.  ?Neurological:  ?   Mental Status: He is alert.  ?Psychiatric:     ?   Mood and Affect: Mood normal.  ?   Comments: Patient is alert, responding appropriately to questions, not responding to internal stimuli, does not endorse suicidal homicidal ideations.  ? ? ?ED Results / Procedures / Treatments   ?Labs ?(all labs ordered are listed, but only abnormal results are displayed) ?Labs Reviewed  ?RESP PANEL BY RT-PCR (FLU A&B, COVID) ARPGX2  ?URINALYSIS, ROUTINE W REFLEX  MICROSCOPIC  ? ? ?EKG ?None ? ?Radiology ?No results found. ? ?Procedures ?Procedures  ? ? ?Medications Ordered in ED ?Medications  ?ibuprofen (ADVIL) tablet 600 mg (600 mg Oral Given 01/06/22 0334)  ? ? ?ED Course/ Medical Decision Making/ A&P ?  ?                        ?Medical Decision Making ?Amount and/or Complexity of Data Reviewed ?Labs: ordered. ? ?Risk ?OTC drugs. ? ? ?This patient presents to the ED for concern of URI like symptoms, this involves an extensive number of treatment options, and is a complaint that carries with it a high risk of complications and morbidity.  The differential diagnosis includes pneumonia, PE, sepsis ? ? ? ?Additional history obtained: ? ?Additional history obtained from electronic medical record ?External records from outside source obtained and reviewed including N/A ? ? ?Co morbidities that complicate the patient evaluation ? ?Psychiatric disorder ? ?Social Determinants of Health: ? ?N/A ? ? ? ?Lab Tests: ? ?I Ordered, and personally interpreted labs.  The pertinent results include: Respiratory panel pending at this time, UA unremarkable ? ? ?Imaging Studies ordered: ? ?I ordered imaging studies including N/A ?I independently visualized and interpreted imaging which showed N/A ?I agree with the radiologist interpretation ? ? ?Cardiac Monitoring: ? ?The patient was maintained on a cardiac monitor.  I personally viewed and interpreted the cardiac monitored which showed an underlying rhythm of: N/A ? ? ?Medicines ordered and prescription drug management: ? ?I ordered medication including ibuprofen for general body aches ?I have reviewed the patients home medicines and have made adjustments as needed ? ? ?Reevaluation: ? ?On evaluation patient likely has a viral URI, will provide with ibuprofen for muscle pain.  He  also endorses is urinary frequency no dysuria or hematuria no flank tenderness no stomach pain no penile discharge will obtain UA. ? ? ?Test Considered: ? ?Chest  x-ray but will defer as my suspicion for pneumonia are very low at this time lung sounds were clear bilaterally he is nontoxic-appearing presentation more consistent with viral URI. ? ? ? ?Rule out ?Low suspicion for systemic infection as patient is nontoxic-appearing, vital signs reassuring, no obvious source infection noted on exam.  Low suspicion for pneumonia as lung sounds are clear bilaterally, I have low suspicion for PE patient is PERC. low suspicion for strep throat as oropharynx was visualized, no erythema or exudates noted.  Low suspicion patient would need  hospitalized due to viral infection or Covid as vital signs reassuring, patient is not in respiratory distress.  Low suspicion for UTI, pyelo-, kidney stone UA is negative for sign infection or hematuria.  Low suspicion for  STI denies testicular pain penile discharge. ? ? ? ? ?Dispostion and problem list ? ?After consideration of the diagnostic results and the patients response to treatment, I feel that the patent would benefit from discharge. ? ?URI-likely viral nature, will recommend symptom management, will defer on antiviral treatments as he has very mild symptoms he is at low risk factors for adverse outcome. ?Urinary frequency-unclear etiology recommend follow-up PCP for further evaluation. ?Medication refill- will defer on represcribing Haldol as this medication that needs to be closely monitored, will have him follow-up with [facility] behavioral health for further evaluation. ? ? ? ? ? ? ? ? ? ? ? ?Final Clinical Impression(s) / ED Diagnoses ?Final diagnoses:  ?Upper respiratory tract infection, unspecified type  ?Medication refill  ? ? ?Rx / DC Orders ?ED Discharge Orders   ? ? None  ? ?  ? ? ?  ?Marcello Fennel, PA-C ?01/06/22 0425 ? ?  ?Fatima Blank, MD ?01/06/22 0805 ? ?

## 2022-01-06 NOTE — ED Triage Notes (Signed)
BIB EMS from home for weakness, abd, insomnia needs refill on haldol, headache and back pain, was drinking and smoking THC all day ?

## 2022-01-09 ENCOUNTER — Emergency Department (HOSPITAL_COMMUNITY)
Admission: EM | Admit: 2022-01-09 | Discharge: 2022-01-09 | Discharge status: Against Medical Advice | Payer: Medicaid Other | Source: Home / Self Care

## 2022-01-10 ENCOUNTER — Encounter (HOSPITAL_COMMUNITY): Payer: Self-pay

## 2022-01-10 ENCOUNTER — Emergency Department (HOSPITAL_COMMUNITY)
Admission: EM | Admit: 2022-01-10 | Discharge: 2022-01-10 | Disposition: A | Discharge status: Home / Self Care | Payer: Medicaid Other | Attending: Emergency Medicine | Admitting: Emergency Medicine

## 2022-01-10 DIAGNOSIS — Z20822 Contact with and (suspected) exposure to covid-19: Secondary | ICD-10-CM | POA: Insufficient documentation

## 2022-01-10 DIAGNOSIS — M791 Myalgia, unspecified site: Secondary | ICD-10-CM | POA: Insufficient documentation

## 2022-01-10 DIAGNOSIS — R509 Fever, unspecified: Secondary | ICD-10-CM | POA: Diagnosis not present

## 2022-01-10 DIAGNOSIS — R1084 Generalized abdominal pain: Secondary | ICD-10-CM | POA: Diagnosis present

## 2022-01-10 LAB — COMPREHENSIVE METABOLIC PANEL
ALT: 16 U/L (ref 0–44)
AST: 35 U/L (ref 15–41)
Albumin: 4.5 g/dL (ref 3.5–5.0)
Alkaline Phosphatase: 34 U/L — ABNORMAL LOW (ref 38–126)
Anion gap: 8 (ref 5–15)
BUN: 17 mg/dL (ref 6–20)
CO2: 28 mmol/L (ref 22–32)
Calcium: 9.3 mg/dL (ref 8.9–10.3)
Chloride: 100 mmol/L (ref 98–111)
Creatinine, Ser: 0.9 mg/dL (ref 0.61–1.24)
GFR, Estimated: >60 mL/min (ref >60)
Glucose, Bld: 83 mg/dL (ref 70–99)
Potassium: 4.1 mmol/L (ref 3.5–5.1)
Sodium: 136 mmol/L (ref 135–145)
Total Bilirubin: 0.5 mg/dL (ref 0.3–1.2)
Total Protein: 7.7 g/dL (ref 6.5–8.1)

## 2022-01-10 LAB — RESP PANEL BY RT-PCR (FLU A&B, COVID) ARPGX2
Influenza A by PCR: NEGATIVE
Influenza B by PCR: NEGATIVE
SARS Coronavirus 2 by RT PCR: NEGATIVE

## 2022-01-10 LAB — CBC
HCT: 41.5 % (ref 39.0–52.0)
Hemoglobin: 14 g/dL (ref 13.0–17.0)
MCH: 31.3 pg (ref 26.0–34.0)
MCHC: 33.7 g/dL (ref 30.0–36.0)
MCV: 92.8 fL (ref 80.0–100.0)
Platelets: 176 10*3/uL (ref 150–400)
RBC: 4.47 MIL/uL (ref 4.22–5.81)
RDW: 14.5 % (ref 11.5–15.5)
WBC: 5.9 10*3/uL (ref 4.0–10.5)
nRBC: 0 % (ref 0.0–0.2)

## 2022-01-10 LAB — LIPASE, BLOOD: Lipase: 33 U/L (ref 11–51)

## 2022-01-10 MED ORDER — HALOPERIDOL 10 MG PO TABS: 10.0000 mg | ORAL_TABLET | Freq: Every day | ORAL | 0 refills | Status: DC

## 2022-01-10 MED ORDER — DIVALPROEX SODIUM 500 MG PO DR TAB: 1500.0000 mg | DELAYED_RELEASE_TABLET | Freq: Every day | ORAL | 0 refills | Status: DC

## 2022-01-10 NOTE — ED Notes (Signed)
Pt went to restroom for bowel movement.  States he was unable to obtain a urine sample.  Pt back to room. Eating potato chips.  Pt out to desk shortly after to ask for hand lotion.  Pt continues eating and drinking in no distress.  ?

## 2022-01-10 NOTE — Discharge Instructions (Addendum)
You came to the emergency department today to be evaluated for your abdominal pain, generalized body aches and medication refill.  Your lab results were reassuring.  You have a COVID-19 test pending at this time.  Please logon to your [facility] MyChart to review your results. ? ?I have given you a refill on your medications however it is very important that you follow-up with a psychiatrist listed on the attached paperwork.  They will need to prescribe this medication moving forward ? ?Get help right away if: ?Your pain does not go away as soon as your health care provider told you to expect. ?You cannot stop vomiting. ?Your pain is only in areas of the abdomen, such as the right side or the left lower portion of the abdomen. Pain on the right side could be caused by appendicitis. ?You have bloody or black stools, or stools that look like tar. ?You have severe pain, cramping, or bloating in your abdomen. ?You have signs of dehydration, such as: ?Dark urine, very little urine, or no urine. ?Cracked lips. ?Dry mouth. ?Sunken eyes. ?Sleepiness. ?Weakness. ?You have trouble breathing or chest pain. ?

## 2022-01-10 NOTE — ED Triage Notes (Signed)
Pt arrived via POV, c/o abd pain, back pain, and muscle weakness for several weeks. States he also needs a refill for his depakote and haldol.  ?

## 2022-01-10 NOTE — ED Provider Notes (Signed)
?[facility]-EMERGENCY DEPT ?Provider Note ? ? ?CSN: [id number] ?Arrival date & time: 01/10/22  1357 ? ?  ? ?History ? ?Chief Complaint  ?Patient presents with  ? Abdominal Pain  ? ? ?David Blankenship is a 36 y.o. male with a past medical history of, seizures, bipolar 1 disorder, schizophrenia, ADHD, depression.  Presents to the emergency department with a chief complaint of medication refill, abdominal pain, and generalized body aches. ? ?Patient is requesting refill of his Haldol and Depakote.  States that he has been out of this medication for the last 2 days.  Per chart review patient had this medication previously refilled by provider in the emergency department.  Patient is advised to follow-up with psychiatry in outpatient setting for further management.  Patient has not been able to follow-up with psychiatry in the outpatient setting.  Patient saw his primary care provider on 3/20 however she was unable to refill his medications at that time. ? ?Patient reports that abdominal pain has been present for the last 2 to 3 weeks.  Pain is generalized throughout his entire abdomen.  Pain has been intermittent over this time.  Patient denies any aggravating factors.  Has taken Pepto-Bismol with minimal improvement in his symptoms.  Patient endorses associated constipation.  States that his last bowel movement was yesterday.  Endorses subjective fevers.  Denies any nausea, vomiting, blood in stool, melena, dysuria, hematuria, urinary urgency, swelling or tenderness to genitals, penile discharge. ? ?Additionally patient reports generalized body aches intermittently over the last 2 to 3 weeks.  Denies any known sick contacts.  Patient reports that he has been vaccinated for COVID-19 and influenza however has not received any COVID-19 boosters. ? ? ? ? ?Abdominal Pain ?Associated symptoms: fever   ?Associated symptoms: no chest pain, no chills, no constipation, no diarrhea, no dysuria, no hematuria,  no nausea, no shortness of breath and no vomiting   ? ?  ? ?Home Medications ?Prior to Admission medications   ?Medication Sig Start Date End Date Taking? Authorizing Provider  ?divalproex (DEPAKOTE) 500 MG DR tablet Take 3 tablets (1,500 mg total) by mouth at bedtime. 11/26/21   Mancel Bale, MD  ?haloperidol (HALDOL) 10 MG tablet Take 1 tablet (10 mg total) by mouth at bedtime. 11/26/21   Mancel Bale, MD  ?   ? ?Allergies    ?Acetaminophen, Bee pollen, and Shellfish allergy   ? ?Review of Systems   ?Review of Systems  ?Constitutional:  Positive for fever. Negative for chills.  ?Eyes:  Negative for visual disturbance.  ?Respiratory:  Negative for shortness of breath.   ?Cardiovascular:  Negative for chest pain.  ?Gastrointestinal:  Positive for abdominal pain. Negative for abdominal distention, anal bleeding, blood in stool, constipation, diarrhea, nausea, rectal pain and vomiting.  ?Genitourinary:  Positive for frequency. Negative for difficulty urinating, dysuria, flank pain, genital sores, hematuria, penile discharge, penile pain, penile swelling, scrotal swelling, testicular pain and urgency.  ?Musculoskeletal:  Positive for myalgias. Negative for back pain and neck pain.  ?Skin:  Negative for color change and rash.  ?Neurological:  Negative for dizziness, syncope, light-headedness and headaches.  ?Psychiatric/Behavioral:  Negative for confusion and suicidal ideas.   ? ?Physical Exam ?Updated Vital Signs ?BP (!) 146/73 (BP Location: Left Arm)   Pulse 70   Temp 98.5 ?F (36.9 ?C) (Oral)   Resp 18   SpO2 98%  ?Physical Exam ?Vitals and nursing note reviewed.  ?Constitutional:   ?   General: He is not  in acute distress. ?   Appearance: He is not ill-appearing, toxic-appearing or diaphoretic.  ?HENT:  ?   Head: Normocephalic.  ?Eyes:  ?   General: No scleral icterus.    ?   Right eye: No discharge.     ?   Left eye: No discharge.  ?Cardiovascular:  ?   Rate and Rhythm: Normal rate.  ?Pulmonary:  ?   Effort:  Pulmonary effort is normal. No tachypnea, bradypnea or respiratory distress.  ?   Breath sounds: Normal breath sounds. No stridor.  ?Abdominal:  ?   General: Abdomen is flat. Bowel sounds are normal. There is no distension. There are no signs of injury.  ?   Palpations: Abdomen is soft. There is no mass or pulsatile mass.  ?   Tenderness: There is no abdominal tenderness. There is no right CVA tenderness, left CVA tenderness, guarding or rebound.  ?Skin: ?   General: Skin is warm and dry.  ?Neurological:  ?   General: No focal deficit present.  ?   Mental Status: He is alert and oriented to person, place, and time.  ?   GCS: GCS eye subscore is 4. GCS verbal subscore is 5. GCS motor subscore is 6.  ?   Cranial Nerves: Cranial nerves 2-12 are intact. No cranial nerve deficit, dysarthria or facial asymmetry.  ?   Sensory: Sensation is intact.  ?   Motor: No weakness, tremor, seizure activity or pronator drift.  ?   Coordination: Finger-Nose-Finger Test normal.  ?   Gait: Gait is intact. Gait normal.  ?Psychiatric:     ?   Behavior: Behavior is cooperative.  ? ? ?ED Results / Procedures / Treatments   ?Labs ?(all labs ordered are listed, but only abnormal results are displayed) ?Labs Reviewed  ?COMPREHENSIVE METABOLIC PANEL - Abnormal; Notable for the following components:  ?    Result Value  ? Alkaline Phosphatase 34 (*)   ? All other components within normal limits  ?RESP PANEL BY RT-PCR (FLU A&B, COVID) ARPGX2  ?LIPASE, BLOOD  ?CBC  ? ? ?EKG ?None ? ?Radiology ?No results found. ? ?Procedures ?Procedures  ? ? ?Medications Ordered in ED ?Medications - No data to display ? ?ED Course/ Medical Decision Making/ A&P ?  ?                        ?Medical Decision Making ?Amount and/or Complexity of Data Reviewed ?Labs: ordered. ? ?Risk ?Prescription drug management. ? ? ?Alert 36 year old male in no acute stress, nontoxic-appearing.  Presents to the emergency department with a chief complaint of medication refill,  abdominal pain, and generalized body aches. ? ?Information obtained from patient patient's significant other at bedside.  Past medical records reviewed including previous provider notes, labs, and imaging.  Patient has past medical history as outlined in HPI which complicates his care. ? ?With reports of abdominal pain will obtain CMP, CBC, lipase and urinalysis.  We will hold any CT imaging at this time as patient's abdomen is soft, nondistended, nontender with no guarding or rebound tenderness. ? ?With reports of generalized body aches and subjective fever concern for possible viral illness.  We will swab patient for COVID-19 and influenza. ? ?Per chart review patient is prescribed Depakote and Haldol.  Takes these medications nightly.  Patient was last prescribed this medication by provider in the emergency department approximately 1 month prior.  If lab testing is unremarkable will prescribe patient with further medication  refill however patient will need to follow-up with psychiatry in the outpatient setting for further management of this medication. ? ?I personally viewed and interpreted patient's lab results.  Pertinent findings include: ?-CMP, CBC, and lipase unremarkable. ? ?Patient is requesting to leave the emergency department prior to urinalysis being obtained.  Patient was informed that without urinalysis we cannot determine if he has a urinary tract infection which is a concern due to his reports of urinary frequency.  Patient understands this and continues to want to leave the emergency department. ? ?Respiratory panel pending at this time.  Patient will follow-up with test results on his [facility] MyChart.   ? ?As lab results are within normal limits will prescribe patient with refill of his Depakote and Haldol.  Importance of psychiatry follow-up was stressed to the patient.  Patient given resources to follow-up with psychiatry in outpatient setting. ? ?Discussed results, findings, treatment and  follow up. Patient advised of return precautions. Patient verbalized understanding and agreed with plan. ? ? ? ? ? ? ? ? ?Final Clinical Impression(s) / ED Diagnoses ?Final diagnoses:  ?Generalized abdominal pain

## 2022-01-10 NOTE — ED Notes (Signed)
Pt refused discharge vital signs upon leaving.  Anxious to get on the bus ? ?

## 2022-01-21 ENCOUNTER — Encounter (HOSPITAL_COMMUNITY): Payer: Self-pay | Admitting: Emergency Medicine

## 2022-01-21 ENCOUNTER — Emergency Department (HOSPITAL_COMMUNITY)
Admission: EM | Admit: 2022-01-21 | Discharge: 2022-01-22 | Disposition: A | Discharge status: Home / Self Care | Payer: Medicaid Other | Attending: Emergency Medicine | Admitting: Emergency Medicine

## 2022-01-21 ENCOUNTER — Other Ambulatory Visit: Payer: Self-pay

## 2022-01-21 DIAGNOSIS — X509XXA Other and unspecified overexertion or strenuous movements or postures, initial encounter: Secondary | ICD-10-CM | POA: Insufficient documentation

## 2022-01-21 DIAGNOSIS — M79661 Pain in right lower leg: Secondary | ICD-10-CM | POA: Insufficient documentation

## 2022-01-21 DIAGNOSIS — Y9302 Activity, running: Secondary | ICD-10-CM | POA: Diagnosis not present

## 2022-01-21 DIAGNOSIS — Z5321 Procedure and treatment not carried out due to patient leaving prior to being seen by health care provider: Secondary | ICD-10-CM | POA: Insufficient documentation
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
## 2022-01-22 ENCOUNTER — Other Ambulatory Visit: Payer: Self-pay

## 2022-01-22 ENCOUNTER — Emergency Department (HOSPITAL_COMMUNITY)
Admission: EM | Admit: 2022-01-22 | Discharge: 2022-01-22 | Disposition: A | Discharge status: Home / Self Care | Payer: Medicaid Other | Source: Home / Self Care | Attending: Emergency Medicine | Admitting: Emergency Medicine

## 2022-01-22 ENCOUNTER — Encounter (HOSPITAL_COMMUNITY): Payer: Self-pay

## 2022-01-22 DIAGNOSIS — M79604 Pain in right leg: Secondary | ICD-10-CM | POA: Insufficient documentation

## 2022-01-22 DIAGNOSIS — Y9302 Activity, running: Secondary | ICD-10-CM | POA: Insufficient documentation

## 2022-01-22 DIAGNOSIS — X58XXXA Exposure to other specified factors, initial encounter: Secondary | ICD-10-CM | POA: Insufficient documentation

## 2022-01-22 NOTE — ED Triage Notes (Signed)
Patient c/o "leg cramping " of the right posterior thigh. Patient states the pain started yesterday. ?

## 2022-01-22 NOTE — Discharge Instructions (Signed)
Your exam today is reassuring.  There is no sign of muscle tear, or fracture.  You are able to walk without difficulty.  I recommend you increase your hydration given that you report you are having some cramping.  You can also add in Gatorade 0.  If you have any worsening symptoms you can return to the emergency room. ?

## 2022-01-22 NOTE — ED Provider Notes (Signed)
?[facility]-EMERGENCY DEPT ?Provider Note ? ? ?CSN: [id number] ?Arrival date & time: 01/22/22  [name] ? ?  ? ?History ? ?Chief Complaint  ?Patient presents with  ? Leg Pain  ? ? ?David Blankenship is a 36 y.o. male. ? ?36 year old male presents today with his wife for evaluation of right leg pain of 2-day duration.  Patient states he was running yesterday when symptoms came on.  He describes this as a cramp.  He is worried he might have torn his hamstring.  Patient is able to ambulate without difficulty.  Has not taken anything prior to arrival.  Denies any other injuries. ? ?The history is provided by the patient and the spouse. No language interpreter was used.  ? ?  ? ?Home Medications ?Prior to Admission medications   ?Medication Sig Start Date End Date Taking? Authorizing Provider  ?divalproex (DEPAKOTE) 500 MG DR tablet Take 3 tablets (1,500 mg total) by mouth at bedtime. 01/10/22   Haskel Schroeder, PA-C  ?haloperidol (HALDOL) 10 MG tablet Take 1 tablet (10 mg total) by mouth at bedtime. 01/10/22   Haskel Schroeder, PA-C  ?   ? ?Allergies    ?Acetaminophen, Bee pollen, and Shellfish allergy   ? ?Review of Systems   ?Review of Systems  ?Musculoskeletal:  Positive for myalgias. Negative for arthralgias and gait problem.  ?All other systems reviewed and are negative. ? ?Physical Exam ?Updated Vital Signs ?BP (!) 135/91 (BP Location: Right Arm)   Pulse 84   Temp 98.5 ?F (36.9 ?C) (Oral)   Resp 18   Ht 6\' 1"  (1.854 m)   Wt 97.5 kg   SpO2 95%   BMI 28.37 kg/m?  ?Physical Exam ?Vitals and nursing note reviewed.  ?Constitutional:   ?   General: He is not in acute distress. ?   Appearance: Normal appearance. He is not ill-appearing.  ?HENT:  ?   Head: Normocephalic and atraumatic.  ?   Nose: Nose normal.  ?Eyes:  ?   General: No scleral icterus. ?   Extraocular Movements: Extraocular movements intact.  ?   Conjunctiva/sclera: Conjunctivae normal.  ?Cardiovascular:  ?   Rate and Rhythm:  Normal rate and regular rhythm.  ?   Pulses: Normal pulses.  ?   Heart sounds: Normal heart sounds.  ?Pulmonary:  ?   Effort: Pulmonary effort is normal. No respiratory distress.  ?   Breath sounds: Normal breath sounds. No wheezing or rales.  ?Abdominal:  ?   General: There is no distension.  ?   Tenderness: There is no abdominal tenderness.  ?Musculoskeletal:     ?   General: Normal range of motion.  ?   Cervical back: Normal range of motion.  ?   Right lower leg: No edema.  ?   Left lower leg: No edema.  ?   Comments: Bilateral hips without tenderness to palpation.  Right knee without tenderness to palpation.  Lumbar spine without tenderness to palpation.  Hamstring without irregularities and soft.  Without tenderness to palpation of the hamstring.  Patient is able ambulate without difficulty.  5/5 strength in bilateral lower extremities and flexor and extensor muscle groups.  2+ DP pulse bilaterally.  ?Skin: ?   General: Skin is warm and dry.  ?Neurological:  ?   General: No focal deficit present.  ?   Mental Status: He is alert. Mental status is at baseline.  ? ? ?ED Results / Procedures / Treatments   ?Labs ?(all  labs ordered are listed, but only abnormal results are displayed) ?Labs Reviewed - No data to display ? ?EKG ?None ? ?Radiology ?No results found. ? ?Procedures ?Procedures  ? ? ?Medications Ordered in ED ?Medications - No data to display ? ?ED Course/ Medical Decision Making/ A&P ?  ?                        ?Medical Decision Making ? ?36 year old male presents today for evaluation of right neck pain.  Exam is reassuring and without injury or tenderness.  Patient is able to ambulate without difficulty.  Patient reports cramping pain.  Discussed hydration.  Patient voices understanding and is in agreement with plan.  Wife is also at bedside who voices understanding and is in agreement. ? ?Final Clinical Impression(s) / ED Diagnoses ?Final diagnoses:  ?Right leg pain  ? ? ?Rx / DC Orders ?ED Discharge  Orders   ? ? None  ? ?  ? ? ?  ?Marita Kansas, PA-C ?01/22/22 2027 ? ?  ?Wynetta Fines, MD ?01/22/22 2258 ? ?

## 2022-02-20 ENCOUNTER — Other Ambulatory Visit: Payer: Self-pay

## 2022-02-20 ENCOUNTER — Emergency Department (HOSPITAL_COMMUNITY): Payer: Medicaid Other

## 2022-02-20 ENCOUNTER — Emergency Department (HOSPITAL_COMMUNITY)
Admission: EM | Admit: 2022-02-20 | Discharge: 2022-02-20 | Discharge status: Against Medical Advice | Payer: Medicaid Other | Attending: Emergency Medicine | Admitting: Emergency Medicine

## 2022-02-20 ENCOUNTER — Encounter (HOSPITAL_COMMUNITY): Payer: Self-pay

## 2022-02-20 DIAGNOSIS — K5900 Constipation, unspecified: Secondary | ICD-10-CM | POA: Insufficient documentation

## 2022-02-20 DIAGNOSIS — Z760 Encounter for issue of repeat prescription: Secondary | ICD-10-CM | POA: Diagnosis not present

## 2022-02-20 DIAGNOSIS — Z5321 Procedure and treatment not carried out due to patient leaving prior to being seen by health care provider: Secondary | ICD-10-CM | POA: Insufficient documentation

## 2022-02-20 DIAGNOSIS — R109 Unspecified abdominal pain: Secondary | ICD-10-CM | POA: Diagnosis present

## 2022-02-20 NOTE — ED Notes (Addendum)
I called patient for lab collect and no one responded ?

## 2022-02-20 NOTE — ED Triage Notes (Addendum)
Patient constipation x 2 weeks. Patient c/o mid abdominal pain. ? ? ?Patient also reports that he needs his psych medications refilled. ?

## 2022-02-20 NOTE — ED Provider Triage Note (Signed)
Emergency Medicine Provider Triage Evaluation Note ? ?David Blankenship , a 36 y.o. male  was evaluated in triage.  Pt complains of abd pain, constipation and vomiting.  Tried nothing. ? 1 episode of vomiting last night. ?Wants refills on meds ? ?Review of Systems  ?Positive: Constipation  ?Negative: fever ? ?Physical Exam  ?BP 128/88 (BP Location: Right Arm)   Pulse 83   Temp 97.9 ?F (36.6 ?C) (Oral)   Resp 18   Ht 6\' 2"  (1.88 m)   Wt 93 kg   SpO2 98%   BMI 26.32 kg/m?  ?Gen:   Awake, no distress   ?Resp:  Normal effort  ?MSK:   Moves extremities without difficulty  ?Other:  No distension  ? ?Medical Decision Making  ?Medically screening exam initiated at 1:44 PM.  Appropriate orders placed.  David Blankenship was informed that the remainder of the evaluation will be completed by another provider, this initial triage assessment does not replace that evaluation, and the importance of remaining in the ED until their evaluation is complete. ? ?Work up initiated. ?  ?Margarita Mail, PA-C ?02/20/22 1350 ? ?

## 2022-02-23 ENCOUNTER — Encounter (HOSPITAL_COMMUNITY): Payer: Self-pay

## 2022-02-23 ENCOUNTER — Other Ambulatory Visit: Payer: Self-pay

## 2022-02-23 ENCOUNTER — Emergency Department (HOSPITAL_COMMUNITY)
Admission: EM | Admit: 2022-02-23 | Discharge: 2022-02-23 | Disposition: A | Discharge status: Home / Self Care | Payer: Medicaid Other | Attending: Emergency Medicine | Admitting: Emergency Medicine

## 2022-02-23 DIAGNOSIS — Z760 Encounter for issue of repeat prescription: Secondary | ICD-10-CM | POA: Insufficient documentation

## 2022-02-23 MED ORDER — DIVALPROEX SODIUM 500 MG PO DR TAB: 1500.0000 mg | DELAYED_RELEASE_TABLET | Freq: Every day | ORAL | 0 refills | Status: DC

## 2022-02-23 MED ORDER — HALOPERIDOL 10 MG PO TABS
10.0000 mg | ORAL_TABLET | Freq: Every day | ORAL | 0 refills | Status: DC
Start: 2022-02-23 — End: 2022-04-01

## 2022-02-23 MED ORDER — HALOPERIDOL 10 MG PO TABS: 10.0000 mg | ORAL_TABLET | Freq: Every day | ORAL | 0 refills | Status: DC

## 2022-02-23 NOTE — ED Triage Notes (Addendum)
Patient said his girlfriend changed the locks to his house. It has his mental health medications in there. He needs his Haldol and Depakote and Gabapentin. He is increasingly becoming loud and can barely take a breath to get his sentences in but remains cooperative. He wants the police to help him too. ?

## 2022-02-23 NOTE — ED Provider Notes (Signed)
?[facility]-EMERGENCY DEPT ?Provider Note ? ? ?CSN: [id number] ?Arrival date & time: 02/23/22  0356 ? ?  ? ?History ? ?Chief Complaint  ?Patient presents with  ? Medication Refill  ? ? ?David Blankenship is a 36 y.o. male. ? ?The history is provided by the patient.  ?Medication Refill ?David Blankenship is a 36 y.o. male who presents to the Emergency Department complaining of medication refill.  He presents to the emergency department requesting a refill on his Depakote and Haldol.  He states that his significant other changed the locks and he can no longer get to his medications.  His last dose was last night.  No SI, HI or hallucinations.  No additional complaints. ?  ? ?Home Medications ?Prior to Admission medications   ?Medication Sig Start Date End Date Taking? Authorizing Provider  ?divalproex (DEPAKOTE) 500 MG DR tablet Take 3 tablets (1,500 mg total) by mouth at bedtime. 02/23/22   Tilden Fossa, MD  ?haloperidol (HALDOL) 10 MG tablet Take 1 tablet (10 mg total) by mouth at bedtime. 02/23/22   Tilden Fossa, MD  ?   ? ?Allergies    ?Acetaminophen, Bee pollen, and Shellfish allergy   ? ?Review of Systems   ?Review of Systems  ?All other systems reviewed and are negative. ? ?Physical Exam ?Updated Vital Signs ?BP (!) 147/132 (BP Location: Left Arm)   Pulse 71   Temp 98.6 ?F (37 ?C) (Oral)   Resp 20   Wt 95.3 kg   SpO2 100%   BMI 26.96 kg/m?  ?Physical Exam ?Vitals and nursing note reviewed.  ?Constitutional:   ?   Appearance: He is well-developed.  ?HENT:  ?   Head: Normocephalic and atraumatic.  ?Cardiovascular:  ?   Rate and Rhythm: Normal rate and regular rhythm.  ?Pulmonary:  ?   Effort: Pulmonary effort is normal. No respiratory distress.  ?Abdominal:  ?   Palpations: Abdomen is soft.  ?   Tenderness: There is no abdominal tenderness. There is no guarding or rebound.  ?Musculoskeletal:     ?   General: No tenderness.  ?Skin: ?   General: Skin is warm and dry.  ?Neurological:  ?    Mental Status: He is alert and oriented to person, place, and time.  ?Psychiatric:  ?   Comments: No active SI, HI.  Patient with fast, tangential speech.  ? ? ?ED Results / Procedures / Treatments   ?Labs ?(all labs ordered are listed, but only abnormal results are displayed) ?Labs Reviewed - No data to display ? ?EKG ?None ? ?Radiology ?No results found. ? ?Procedures ?Procedures  ? ? ?Medications Ordered in ED ?Medications - No data to display ? ?ED Course/ Medical Decision Making/ A&P ?  ?                        ?Medical Decision Making ?Risk ?Prescription drug management. ? ? ?Patient here requesting medication refill for seizure disorder and psychiatric illness. Patient is not actively psychotic, suicidal or homicidal.  He does have slightly fast and tangential speech.  Records reviewed in epic, normal liver function in March of this year.  Will provide refill at this time.  Discussed with patient that he needs to go to his primary psychiatrist or PCP for further refills on medications.  ?Case management referral placed to assist pt with shelter resources as pt currently does not have a place to live - he was previously living with  his so.  ? ? ? ? ? ? ? ?Final Clinical Impression(s) / ED Diagnoses ?Final diagnoses:  ?Medication refill  ? ? ?Rx / DC Orders ?ED Discharge Orders   ? ?      Ordered  ?  haloperidol (HALDOL) 10 MG tablet  Daily at bedtime       ? 02/23/22 0505  ?  divalproex (DEPAKOTE) 500 MG DR tablet  Daily at bedtime       ? 02/23/22 0505  ? ?  ?  ? ?  ? ? ?  ?Tilden Fossa, MD ?02/23/22 [phone] ? ?

## 2022-02-25 ENCOUNTER — Other Ambulatory Visit: Payer: Self-pay

## 2022-02-25 ENCOUNTER — Encounter (HOSPITAL_COMMUNITY): Payer: Self-pay

## 2022-02-25 ENCOUNTER — Emergency Department (HOSPITAL_COMMUNITY)
Admission: EM | Admit: 2022-02-25 | Discharge: 2022-02-26 | Disposition: A | Discharge status: Home / Self Care | Payer: Medicaid Other | Attending: Emergency Medicine | Admitting: Emergency Medicine

## 2022-02-25 DIAGNOSIS — Z760 Encounter for issue of repeat prescription: Secondary | ICD-10-CM | POA: Insufficient documentation

## 2022-02-25 NOTE — ED Triage Notes (Signed)
Patient said he needs his depakote and haldol refilled. ?

## 2022-02-26 NOTE — Discharge Instructions (Signed)
Please go to the [facility] to pick up your medicine.  ? ?[address] AT [facility] OF [address] ? ?Follow-up with your primary care provider as well as behavioral health.  Return to the ER for new or worsening symptoms or any other concerns. ?

## 2022-02-26 NOTE — ED Provider Notes (Signed)
?[facility] DEPT ?Provider Note ? ? ?CSN: [id number] ?Arrival date & time: 02/25/22  2215 ? ?  ? ?History ? ?Chief Complaint  ?Patient presents with  ? Medication Refill  ? ? ?David Blankenship is a 36 y.o. male with a hx of schizophrenia, bipolar disorder, and malingering who presents to the ED requesting refills of haldol & depakote. No other complaints. Requesting something to eat.  ? ?HPI ? ?  ? ?Home Medications ?Prior to Admission medications   ?Medication Sig Start Date End Date Taking? Authorizing Provider  ?divalproex (DEPAKOTE) 500 MG DR tablet Take 3 tablets (1,500 mg total) by mouth at bedtime. 02/23/22   Quintella Reichert, MD  ?haloperidol (HALDOL) 10 MG tablet Take 1 tablet (10 mg total) by mouth at bedtime. 02/23/22   Quintella Reichert, MD  ?   ? ?Allergies    ?Acetaminophen, Bee pollen, and Shellfish allergy   ? ?Review of Systems   ?Review of Systems  ?Constitutional:  Negative for chills and fever.  ?Respiratory:  Negative for shortness of breath.   ?Cardiovascular:  Negative for chest pain.  ?Gastrointestinal:  Negative for abdominal pain.  ?Psychiatric/Behavioral:  Negative for suicidal ideas.   ?All other systems reviewed and are negative. ? ?Physical Exam ?Updated Vital Signs ?BP 131/89 (BP Location: Left Arm)   Pulse 80   Temp 98.6 ?F (37 ?C) (Oral)   Resp 18   Ht 6\' 2"  (1.88 m)   Wt 95.3 kg   SpO2 100%   BMI 26.96 kg/m?  ?Physical Exam ?Vitals and nursing note reviewed.  ?Constitutional:   ?   General: He is not in acute distress. ?   Appearance: He is well-developed.  ?HENT:  ?   Head: Normocephalic and atraumatic.  ?Eyes:  ?   General:     ?   Right eye: No discharge.     ?   Left eye: No discharge.  ?   Conjunctiva/sclera: Conjunctivae normal.  ?Cardiovascular:  ?   Rate and Rhythm: Normal rate and regular rhythm.  ?Pulmonary:  ?   Effort: Pulmonary effort is normal. No respiratory distress.  ?Abdominal:  ?   General: There is no distension.  ?   Palpations:  Abdomen is soft.  ?Neurological:  ?   Mental Status: He is alert.  ?   Comments: Clear speech.   ?Psychiatric:     ?   Behavior: Behavior is cooperative.  ? ? ?ED Results / Procedures / Treatments   ?Labs ?(all labs ordered are listed, but only abnormal results are displayed) ?Labs Reviewed - No data to display ? ?EKG ?None ? ?Radiology ?No results found. ? ?Procedures ?Procedures  ? ? ?Medications Ordered in ED ?Medications - No data to display ? ?ED Course/ Medical Decision Making/ A&P ?  ?                        ?Medical Decision Making ? ?Patient presented to the emergency department requesting medication refill of his Depakote and his Haldol.  He is nontoxic, resting comfortably, his vitals are within normal limits.He is requesting something to eat but has no other complaints at this time.  ? ?Chart reviewed for additional history, ED visit 02/23/2022 with same complaint, prescriptions were sent to [place]. ? ?I discussed with the patient that the prescriptions were sent to [facility], he has not been there to pick them up, he forgot which pharmacy they were sent to. [facility] was  [facility]. He also was provided something to eat/drink. Overall appears appropriate for discharge.  ? ? ? ? ? ? ? ?Final Clinical Impression(s) / ED Diagnoses ?Final diagnoses:  ?Medication refill  ? ? ?Rx / DC Orders ?ED Discharge Orders   ? ? None  ? ?  ? ? ?  ?Amaryllis Dyke, PA-C ?02/26/22 0448 ? ?  ?Shanon Rosser, MD ?02/26/22 [phone] ? ?

## 2022-03-11 ENCOUNTER — Other Ambulatory Visit: Payer: Self-pay

## 2022-03-11 ENCOUNTER — Emergency Department (HOSPITAL_COMMUNITY): Payer: Medicaid Other

## 2022-03-11 ENCOUNTER — Emergency Department (HOSPITAL_COMMUNITY)
Admission: EM | Admit: 2022-03-11 | Discharge: 2022-03-12 | Disposition: A | Discharge status: Home / Self Care | Payer: Medicaid Other | Attending: Emergency Medicine | Admitting: Emergency Medicine

## 2022-03-11 ENCOUNTER — Encounter (HOSPITAL_COMMUNITY): Payer: Self-pay

## 2022-03-11 ENCOUNTER — Ambulatory Visit (HOSPITAL_COMMUNITY)
Admission: EM | Admit: 2022-03-11 | Discharge: 2022-03-11 | Disposition: A | Discharge status: Home / Self Care | Payer: Medicaid Other | Attending: Physician Assistant | Admitting: Physician Assistant

## 2022-03-11 DIAGNOSIS — M545 Low back pain, unspecified: Secondary | ICD-10-CM | POA: Diagnosis not present

## 2022-03-11 DIAGNOSIS — K5900 Constipation, unspecified: Secondary | ICD-10-CM | POA: Diagnosis present

## 2022-03-11 DIAGNOSIS — F250 Schizoaffective disorder, bipolar type: Secondary | ICD-10-CM | POA: Insufficient documentation

## 2022-03-11 DIAGNOSIS — Z008 Encounter for other general examination: Secondary | ICD-10-CM

## 2022-03-11 DIAGNOSIS — F909 Attention-deficit hyperactivity disorder, unspecified type: Secondary | ICD-10-CM | POA: Insufficient documentation

## 2022-03-11 DIAGNOSIS — F32A Depression, unspecified: Secondary | ICD-10-CM | POA: Insufficient documentation

## 2022-03-11 NOTE — ED Provider Notes (Addendum)
[facility]-EMERGENCY DEPT Provider Note   CSN: [id number] Arrival date & time: 03/11/22  2217     History  Chief Complaint  Patient presents with   Back Pain    David Blankenship is a 36 y.o. male.  HPI  With medical history including asthma, bipolar, seizures, schizophrenia presents with complaints of constipation.  He initially told the nurses that he was having back pain but t he tells me that he is constipation.  He states that he has been constipated for the last 3 days, states that he has been unable to have a bowel movement, he states he has been trying taking coffee drinking milk and eating cheese without much relief.  States he is also tried some MiraLAX.  He denies nausea vomiting, she still passing flatus, no stomach surgeries, states he will have intermittent stomach pain currently has slight pain at this time.  He states sometimes when he has the constipation he will feel slight back pain this back pain is worsened with movement improved with rest denies saddle paresthesias bowel incontinency or urinary incontinency.  No recent traumas.    Home Medications Prior to Admission medications   Medication Sig Start Date End Date Taking? Authorizing Provider  divalproex (DEPAKOTE) 500 MG DR tablet Take 3 tablets (1,500 mg total) by mouth at bedtime. 02/23/22   Tilden Fossa, MD  haloperidol (HALDOL) 10 MG tablet Take 1 tablet (10 mg total) by mouth at bedtime. 02/23/22   Tilden Fossa, MD      Allergies    Acetaminophen, Bee pollen, and Shellfish allergy    Review of Systems   Review of Systems  Constitutional:  Negative for chills and fever.  Respiratory:  Negative for shortness of breath.   Cardiovascular:  Negative for chest pain.  Gastrointestinal:  Positive for abdominal pain and constipation. Negative for nausea and vomiting.  Neurological:  Negative for headaches.   Physical Exam Updated Vital Signs BP (!) 148/92 (BP Location: Left Arm)    Pulse 83   Temp 98 F (36.7 C) (Oral)   Resp 18   Ht 6\' 2"  (1.88 m)   Wt 95.3 kg   SpO2 100%   BMI 26.96 kg/m  Physical Exam Vitals and nursing note reviewed.  Constitutional:      General: He is not in acute distress.    Appearance: He is not ill-appearing.  HENT:     Head: Normocephalic and atraumatic.     Nose: No congestion.  Eyes:     Conjunctiva/sclera: Conjunctivae normal.  Cardiovascular:     Rate and Rhythm: Normal rate and regular rhythm.     Pulses: Normal pulses.     Heart sounds: No murmur heard.   No friction rub. No gallop.  Pulmonary:     Effort: No respiratory distress.     Breath sounds: No wheezing, rhonchi or rales.  Abdominal:     Palpations: Abdomen is soft.     Tenderness: There is no abdominal tenderness. There is no right CVA tenderness or left CVA tenderness.     Comments: Abdomen nondistended no active bowel sounds dull to percussion, there is no guarding rebound tenderness or peritoneal sign abdomen was nontender my exam.  Negative Murphy sign McBurney point no CVA tenderness.  Musculoskeletal:     Comments: Spine palpated was nontender to palpation no step-off deformities noted no overlying skin changes.  He has 5-5 strength neurovascular intact in lower extremities he is ambulate without difficulty.  Skin:  General: Skin is warm and dry.  Neurological:     Mental Status: He is alert.  Psychiatric:        Mood and Affect: Mood normal.    ED Results / Procedures / Treatments   Labs (all labs ordered are listed, but only abnormal results are displayed) Labs Reviewed - No data to display  EKG None  Radiology DG Abdomen 1 View  Result Date: 03/11/2022 CLINICAL DATA:  Constipation. EXAM: ABDOMEN - 1 VIEW COMPARISON:  Abdominal x-ray 02/20/2022 FINDINGS: The bowel gas pattern is normal. No radio-opaque calculi. There are phleboliths in the left hemipelvis, unchanged. There is a moderate amount of stool throughout the colon. No acute  fractures are seen. Increased sclerosis in the femoral heads, right greater than left, appears unchanged. No acute fractures are seen. IMPRESSION: 1. Nonobstructive bowel gas pattern. 2. Moderate stool burden. 3. 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 Electronically Signed   By: Darliss CheneyAmy  Guttmann M.D.   On: 03/11/2022 23:51    Procedures Procedures    Medications Ordered in ED Medications - No data to display  ED Course/ Medical Decision Making/ A&P                           Medical Decision Making Amount and/or Complexity of Data Reviewed Radiology: ordered.   This patient presents to the ED for concern of constipation, this involves an extensive number of treatment options, and is a complaint that carries with it a high risk of complications and morbidity.  The differential diagnosis includes bowel obstruction, volvulus, dissection    Additional history obtained:  Additional history obtained from N/A External records from outside source obtained and reviewed including psychiatry notes, recent ER notes imaging   Co morbidities that complicate the patient evaluation  N/A  Social Determinants of Health:  Psychiatric disorders    Lab Tests:  I Ordered, and personally interpreted labs.  The pertinent results include: N/A   Imaging Studies ordered:  I ordered imaging studies including DG abdomen I independently visualized and interpreted imaging which showed moderate amount of stool burden present. I agree with the radiologist interpretation   Cardiac Monitoring:  The patient was maintained on a cardiac monitor.  I personally viewed and interpreted the cardiac monitored which showed an underlying rhythm of: N/A   Medicines ordered and prescription drug management:  I ordered medication including N/A I have reviewed the patients home medicines and have made adjustments as needed  Critical Interventions:  N/A   Reevaluation:  Presents with constipation  occasional back pain.  He had a benign physical exam, nonsurgical abdomen, back was nontender, he is ambulating without difficulty.  Suspect likely constipation will obtain x-ray to rule out obstruction.  Notified by nursing staff that patient has been disruptive, he has been [employer] Calling the male providers, he also went to the bathroom and was found vaping.  I updated the patient on his imaging he is agreement with discharge at this time.  Consultations Obtained:  N/A    Test Considered:  CT on pelvis-deferred as my suspicion for intra-abdominal abnormality fair at this time nonsurgical abdomen nontoxic-appearing    Rule out low suspicion for lower lobe pneumonia as lung sounds are clear bilaterally, will defer imaging at this time.  I have low suspicion as he has no right upper quadrant tenderness.  Low suspicion for pancreatitis as he has no epigastric tenderness no nausea or vomiting.  Low suspicion for ruptured  stomach ulcer as she has no peritoneal sign present on exam.  Low suspicion for bowel obstruction as abdomen is nondistended normal bowel sounds,  passing gas imaging is negative for this finding..  Low suspicion for complicated diverticulitis as she is nontoxic-appearing, vital signs reassuring no leukocytosis present.  Low suspicion for appendicitis as she has no right lower quadrant tenderness, vital signs reassuring.  I have low suspicion for AAA or dissection presentation atypical of etiology there is no bulging mass my exam abdomen soft nontender he has no back pain.     Dispostion and problem list  After consideration of the diagnostic results and the patients response to treatment, I feel that the patent would benefit from discharge  Constipation-likely this is functional, will recommend he continue with MiraLAX, increase fluid intake, fiber intake, drink plenty fluids.  Follow with PCP as needed. Back pain-since resolved, poss had a strain, will recommend  over-the-counter pain medication as needed follow-up with PCP as needed.            Final Clinical Impression(s) / ED Diagnoses Final diagnoses:  Acute low back pain without sciatica, unspecified back pain laterality  Constipation, unspecified constipation type    Rx / DC Orders ED Discharge Orders     None         Carroll Sage, PA-C 03/11/22 2358    Carroll Sage, PA-C 03/12/22 0010    Nira Conn, MD 03/12/22 [phone]

## 2022-03-11 NOTE — ED Notes (Signed)
Blue, dark green, light green and lavender tubes sent to the lab.

## 2022-03-11 NOTE — ED Notes (Signed)
Pt. Ambulated to the bathroom without difficulty to vape. Pt. Asked to stop vaping. Security notified.

## 2022-03-11 NOTE — ED Provider Notes (Signed)
Behavioral Health Urgent Care Medical Screening Exam  Patient Name: David Blankenship MRN: [id number] Date of Evaluation: 03/11/22 Chief Complaint:   Diagnosis:  Final diagnoses:  Evaluation by psychiatric service required    History of Present illness: David Blankenship is a 36 y.o. male with a past psychiatric history significant for schizoaffective disorder (bipolar type), and attention deficit hyperactivity disorder, and depression who presents to [facility] by way of [employer] after having a brief verbal altercation with his fiance.  Patient states that his girlfriend/fianc, whom also has mental health illnesses, called the police on him after accusing him of talking in "sexting" other women.  Patient denies talking to another woman describing himself as faithful and a good man.  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  He describes his girlfriend/fianc as being a drama queen and states that she is often confused due to her bipolar/schizophrenia diagnosis.  Patient reports that his girlfriend/fianc occasionally hears voices that tell her negative things which often is the cause of their verbal altercations.  Patient is currently living with his girlfriend/fianc and feels safe to go back home to her.  Patient denies suicidal or homicidal ideations.  He further denies auditory or visual hallucinations and does not appear to be responding to internal/external stimuli.  Patient appears to be somewhat paranoid due to thinking that a cab driver that drove his fiance is currently stalking her home.  Patient endorses good sleep and receives on average 8 to 10 hours of sleep each night.  Patient endorses good appetite and eats on average 3 meals per day.  Patient denies alcohol use.  He tobacco use and states that he also engages in vaping.  Patient endorses illicit drug use in the form of marijuana vaping.  Patient is  recommending an ACT Team as well as a therapist for the management of his mental health concerns.  Patient is also interested in receiving a long-acting injectable for the management of his mental health.  Patient denies being a danger to himself and is able to contract for safety.  Patient is eager to leave the facility due to having church in the morning.  Psychiatric Specialty Exam  Presentation  General Appearance:Appropriate for Environment; Casual  Eye Contact:Fair  Speech:Clear and Coherent; Pressured  Speech Volume:Normal  Handedness:Right   Mood and Affect  Mood:Irritable  Affect:Congruent   Thought Process  Thought Processes:Coherent; Goal Directed  Descriptions of Associations:Intact  Orientation:Full (Time, Place and Person)  Thought Content:WDL  Diagnosis of Schizophrenia or Schizoaffective disorder in past: Yes  Duration of Psychotic Symptoms: Greater than six months  Hallucinations:None Patient endorses auditory hallucinations characterized by the voices of David Blankenship, and David Blankenship telling him to harm himself Patient endorses visual hallucinations characterized by seeing dead people and spaces  Ideas of Reference:None  Suicidal Thoughts:No Without Intent; Without Plan  Homicidal Thoughts:No   Sensorium  Memory:Immediate Good; Recent Good; Remote Good  Judgment:Intact  Insight:Present; Fair   Art therapist  Concentration:Good  Attention Span:Fair  Recall:Good  [employer] of Knowledge:Fair  Language:Good   Psychomotor Activity  Psychomotor Activity:Normal   Assets  Assets:Communication Skills; Desire for Improvement; Social Support; Housing   Sleep  Sleep:Good  Number of hours: No data recorded  No data recorded  Physical Exam: @PHYSEXAM @ @ROS @ Blood pressure 132/69, pulse 77, temperature 98.7 F (37.1 C), temperature source Oral, resp. rate 18, SpO2 100 %. There is no height or weight on file  to calculate  BMI.  Musculoskeletal: Strength & Muscle Tone: within normal limits Gait & Station: normal Patient leans: N/A   [facility] MSE Discharge Disposition for Follow up and Recommendations: Based on my evaluation I certify that psychiatric inpatient services furnished can reasonably be expected to improve the patient's condition which I recommend transfer to an appropriate accepting facility.    Meta Hatchet, PA 03/11/2022, 10:26 PM

## 2022-03-11 NOTE — ED Notes (Signed)
Pt discharged in no acute distress. Denied SI/HI/AVH. A&O x4, ambulatory. Verbalized understanding of AVS instructions reviewed by RN and CSW. Belongings returned to pt intact from blue locker. Pt escorted to front lobby by staff and security. Bus pass given. Safety maintained.

## 2022-03-11 NOTE — ED Triage Notes (Signed)
Pt presents to ED with c/o back pain ongoing x 3 weeks. Pt also endorses stomach pain, states he cannot digest foot. Denies N/V/D.

## 2022-03-11 NOTE — ED Provider Notes (Signed)
Behavioral Health Urgent Care Medical Screening Exam  Patient Name: David Blankenship MRN: [id number] Date of Evaluation: 03/11/22 Chief Complaint:   Diagnosis:  Final diagnoses:  Evaluation by psychiatric service required    History of Present illness: David Blankenship is a 36 y.o. male with a past psychiatric history significant for schizoaffective disorder (bipolar type), and attention deficit hyperactivity disorder, and depression who presents to [facility] by way of [facility] after having a brief verbal altercation with his fiance.  Patient states that his girlfriend/fianc, whom also has mental health illnesses, called the police on him after accusing him of talking in "sexting" other women.  Patient denies talking to another woman describing himself as faithful and a good man.  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  He describes his girlfriend/fianc as being a drama queen and states that she is often confused due to her bipolar/schizophrenia diagnosis.  Patient reports that his girlfriend/fianc occasionally hears voices that tell her negative things which often is the cause of their verbal altercations.  Patient is currently living with his girlfriend/fianc and feels safe to go back home to her.  Patient denies suicidal or homicidal ideations.  He further denies auditory or visual hallucinations and does not appear to be responding to internal/external stimuli.  Patient appears to be somewhat paranoid due to thinking that a cab driver that drove his fiance is currently stalking her home.  Patient endorses good sleep and receives on average 8 to 10 hours of sleep each night.  Patient endorses good appetite and eats on average 3 meals per day.  Patient denies alcohol use.  He tobacco use and states that he also engages in vaping.  Patient endorses illicit drug use in the form of marijuana vaping.  Patient is  recommending an ACT Team as well as a therapist for the management of his mental health concerns.  Patient is also interested in receiving a long-acting injectable for the management of his mental health.  Patient denies being a danger to himself and is able to contract for safety.  Patient is eager to leave the facility due to having church in the morning.  Psychiatric Specialty Exam  Presentation  General Appearance:Appropriate for Environment; Casual  Eye Contact:Fair  Speech:Clear and Coherent; Pressured  Speech Volume:Normal  Handedness:Right   Mood and Affect  Mood:Irritable  Affect:Congruent   Thought Process  Thought Processes:Coherent; Goal Directed  Descriptions of Associations:Intact  Orientation:Full (Time, Place and Person)  Thought Content:WDL  Diagnosis of Schizophrenia or Schizoaffective disorder in past: Yes  Duration of Psychotic Symptoms: Greater than six months  Hallucinations:None Patient endorses auditory hallucinations characterized by the voices of David Blankenship, and David Blankenship telling him to harm himself Patient endorses visual hallucinations characterized by seeing dead people and spaces  Ideas of Reference:None  Suicidal Thoughts:No Without Intent; Without Plan  Homicidal Thoughts:No   Sensorium  Memory:Immediate Good; Recent Good; Remote Good  Judgment:Intact  Insight:Present; Fair   Art therapist  Concentration:Good  Attention Span:Fair  Recall:Good  [employer] of Knowledge:Fair  Language:Good   Psychomotor Activity  Psychomotor Activity:Normal   Assets  Assets:Communication Skills; Desire for Improvement; Social Support; Housing   Sleep  Sleep:Good  Number of hours: No data recorded  No data recorded  Physical Exam: Psychiatric:        Attention and Perception: Attention normal. He does not perceive auditory or visual hallucinations.        Mood  and Affect: Affect normal. Mood normal.        Behavior: Behavior  normal. Behavior is cooperative.        Thought Content: Thought content normal. Thought content does not include homicidal or suicidal ideation. Thought content does not include suicidal plan.        Cognition and Memory: Cognition and memory normal.        Judgment: Judgment normal.    Review of Systems  Psychiatric/Behavioral:  Negative for depression. Positive for substance abuse. Negative for hallucinations and suicidal ideas. The patient is not nervous/anxious. The patient does not have insomnia.    Blood pressure 132/69, pulse 77, temperature 98.7 F (37.1 C), temperature source Oral, resp. rate 18, SpO2 100 %. There is no height or weight on file to calculate BMI.  Musculoskeletal: Strength & Muscle Tone: within normal limits Gait & Station: normal Patient leans: N/A   [facility] MSE Discharge Disposition for Follow up and Recommendations: Based on my evaluation the patient does not appear to have an emergency medical condition and can be discharged with resources and follow up care in outpatient services for Medication Management, Individual Therapy, and ACT Team Services.  During the assessment, patient denied suicidal or homicidal ideations and further denied auditory or visual hallucinations.  Patient did not appear to be responding to internal/external stimuli.  Based on my evaluation, patient does not meet criteria for continuous observation.  Patient to be provided resources for ACT Team and outpatient therapy.  Patient to be discharged from this facility.  Meta Hatchet, PA 03/11/2022, 10:39 PM

## 2022-03-11 NOTE — ED Notes (Signed)
Pt sitting up listening to music and singing along. Freedom of movement noted.

## 2022-03-11 NOTE — Discharge Instructions (Addendum)
Patient to be provided resources for therapy and outpatient psychiatry

## 2022-03-11 NOTE — Progress Notes (Signed)
   03/11/22 0342  [facility] Triage Screening (Walk-ins at [facility] only)  How Did You Hear About [place]? Legal System  What Is the Reason for Your Visit/Call Today? Pt presented to [facility] voluntary transported by [employer]. Pt reports that he and his fianc encountered a verbal altercation because pt's fianc thought that pt was sexting another woman. Pt reports that he is faithful and tires his best to be a good man and provide for his partner. 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 Pt reports that both him and his fianc are Bipolar and schizophrenic. Pt reports that fianc hears voices that tells her negative things which leads them to having a verbal altercation that happens often. Pt states that he lives with fianc and that they have been together for 5 years. CSW asked pt if he felt safe to return home with fianc and pt stated "I got to go back, or she will think that I am cheating." Pt demands that care team call his fianc and explain "she either wants a real nigga or a bum ass nigga." CSW and provider used active listening skills and explained to pt that our main concern is to ensure pt is safe.  Pt sates that he feels safe but reports he is very tired. CSW inquired what would best help pt and pt stated that "ACTT Team, therapist, and a shot to help with mental health symptoms." Pt denies SI/HI/AVH/ and paranoia, however pt appears to be somewhat paranoid due to thinking that a cab driving that his fianc used is stalking their home. Pt states that he gets along with fianc's mother. Pt requested a bus pass home, and shares that he needs to leave because he has church at 8:30am and is very tired. Pt also stated that he took Depakote this evening and is now very tired. CSW staffed that case with provider E. Elsie Saas, PA and pt is routine with the recommendation to seek ACTT Team services, therapy, and medication management.  How Long Has This Been  Causing You Problems? 1-6 months  Have You Recently Had Any Thoughts About Hurting Yourself? No  Are You Planning to Commit Suicide/Harm Yourself At This time? No  Have you Recently Had Thoughts About Hurting Someone [name]? No  Are You Planning To Harm Someone At This Time? No  Are you currently experiencing any auditory, visual or other hallucinations? No  Have You Used Any Alcohol or Drugs in the Past 24 Hours? Yes  How long ago did you use Drugs or Alcohol? Marijuana vape  What Did You Use and How Much? Vapes daily  Clinician description of patient physical appearance/behavior: Pt was dressed wearing age appropriate clothing. Pt alert and oriented x4. Pt appeared disheveled and manic.  What Do You Feel Would Help You the Most Today? Treatment for Depression or other mood problem;Support for unsafe relationship  If access to [facility] was not available, would you have sought care in the Emergency Department? Yes  Determination of Need Routine (7 days)  Options For Referral Mobile Crisis;Intensive Outpatient Therapy;Medication Management   Maryjean Ka, MSW, [facility] 03/11/2022 4:58 AM

## 2022-03-11 NOTE — Discharge Instructions (Addendum)
Constipation-I recommend 1 capful of MiraLAX daily  until you have consistent bowel movements, may increase or decrease depending upon your symptoms.  Recommend high-fiber diet which include legumes, vegetables, grains, please drink plenty of fluids as MiraLAX only works if you are hydrated and remember to exercise.  Follow-up with your PCP. Back pain-possible you strained it may use over-the-counter pain medication as needed.  Come back to the emergency department if you develop chest pain, shortness of breath, severe abdominal pain, uncontrolled nausea, vomiting, diarrhea.

## 2022-03-11 NOTE — Progress Notes (Signed)
David Blankenship sitting on the side of the bed. David Blankenship started to vape. David Blankenship quickly gets up and walk to the bathroom puff on the vape, Writer explain to Noank this is a non smoking facility and vape is not allow. David Blankenship pushes the door close with partially open. Writer notify RN Warnell vape in the bathroom.

## 2022-03-12 NOTE — ED Notes (Signed)
Discharge paperwork reviewed with pt. Pt. Showed verbal understanding of paperwork and was escorted out of the ED by Secretary.

## 2022-03-21 ENCOUNTER — Emergency Department (HOSPITAL_COMMUNITY)
Admission: EM | Admit: 2022-03-21 | Discharge: 2022-03-21 | Disposition: A | Discharge status: Home / Self Care | Payer: Medicaid Other | Attending: Emergency Medicine | Admitting: Emergency Medicine

## 2022-03-21 ENCOUNTER — Encounter (HOSPITAL_COMMUNITY): Payer: Self-pay | Admitting: Emergency Medicine

## 2022-03-21 ENCOUNTER — Other Ambulatory Visit: Payer: Self-pay

## 2022-03-21 DIAGNOSIS — Z760 Encounter for issue of repeat prescription: Secondary | ICD-10-CM | POA: Diagnosis present

## 2022-03-21 NOTE — ED Provider Notes (Signed)
[facility]-EMERGENCY DEPT Provider Note   CSN: [id number] Arrival date & time: 03/21/22  1848     History  Chief Complaint  Patient presents with   Medication Refill    David Blankenship is a 36 y.o. male.  36 year old male with a past medical history of schizophrenia along with bipolar presents to the ED requesting medication refill.  According to patient his wife David Blankenship, told him that he needed to get back on his medication along with coming to the ED for a shot.  Patient does not have any previously psychiatry that he sees.  He was seen in the ED in the month of May, had a prescription of 90 tablets of Depakote, 30 tablets of Haldol, which he has yet to pick up from the pharmacy.  He has a subsequent visit to the ED in which he states the same requesting medications although these medications have never been picked up from the pharmacy.  He does report his wife gets upset with him because he is a little "agitated".He denies any SI, HI, visual or auditory hallucinations.   The history is provided by the patient and medical records.  Medication Refill     Home Medications Prior to Admission medications   Medication Sig Start Date End Date Taking? Authorizing Provider  divalproex (DEPAKOTE) 500 MG DR tablet Take 3 tablets (1,500 mg total) by mouth at bedtime. 02/23/22   Tilden Fossa, MD  haloperidol (HALDOL) 10 MG tablet Take 1 tablet (10 mg total) by mouth at bedtime. 02/23/22   Tilden Fossa, MD      Allergies    Acetaminophen, Bee pollen, and Shellfish allergy    Review of Systems   Review of Systems  Constitutional:  Negative for chills and fever.  Psychiatric/Behavioral:  Negative for behavioral problems, sleep disturbance and suicidal ideas. The patient is nervous/anxious.    Physical Exam Updated Vital Signs BP 124/90 (BP Location: Left Arm)   Pulse 98   Temp 98.4 F (36.9 C) (Oral)   Resp 18   SpO2 100%  Physical Exam Vitals and nursing  note reviewed.  Constitutional:      Appearance: Normal appearance.  HENT:     Head: Normocephalic and atraumatic.     Mouth/Throat:     Mouth: Mucous membranes are moist.  Eyes:     Pupils: Pupils are equal, round, and reactive to light.  Cardiovascular:     Rate and Rhythm: Normal rate.  Pulmonary:     Effort: Pulmonary effort is normal.  Abdominal:     General: Abdomen is flat.  Musculoskeletal:     Cervical back: Normal range of motion and neck supple.  Skin:    General: Skin is warm and dry.  Neurological:     Mental Status: He is alert and oriented to person, place, and time.  Psychiatric:        Attention and Perception: Attention normal.        Speech: Speech is rapid and pressured and tangential.        Behavior: Behavior is cooperative.    ED Results / Procedures / Treatments   Labs (all labs ordered are listed, but only abnormal results are displayed) Labs Reviewed - No data to display  EKG None  Radiology No results found.  Procedures Procedures    Medications Ordered in ED Medications - No data to display  ED Course/ Medical Decision Making/ A&P  Medical Decision Making   Patient presents to the ED with a chief complaint of needing a medication refill.  Patient was previously on Depakote, Haldol however has not picked up this medication.  I did review patient's extensive records, he does have a prior visit earlier this month, and on May 5 where he was given a prescription of 90 tablets of Depakote, along with Haldol.  He reports he did not get this medication and wanted to come here for a shot.  He reports his wife David Blankenship is going to kick him out of the house because he has been more restless and agitated.  He is also requesting "pills to help me with sleep ".  During evaluation patient's tangential, rapid speech, cooperative.  Vitals are within normal limits.  Denying SI, HI.  He is currently not homeless does lives with his  wife.  He does not have psychiatric care.  I did discuss with him appropriate use of [facility], will need to have psychiatry further follow him for medication adjustment. He is stable with no SI or HI or visual/auditory hallucinations, patient stable for discharge.     Portions of this note were generated with Scientist, clinical (histocompatibility and immunogenetics). Dictation errors may occur despite best attempts at proofreading.   Final Clinical Impression(s) / ED Diagnoses Final diagnoses:  Medication refill    Rx / DC Orders ED Discharge Orders     None         Claude Manges, Cordelia Poche 03/21/22 1925    Mancel Bale, MD 03/21/22 [phone]

## 2022-03-21 NOTE — Discharge Instructions (Addendum)
Your medications were sent to the [facility] pharmacy at the beginning of this month.  A total of 90 tablets were provided then, you will need to pick up these medications to your pharmacy.  Please follow-up with BDarrel Hoover for your mental health disorders.

## 2022-03-21 NOTE — ED Triage Notes (Signed)
Pt reports wanting bipolar pill

## 2022-04-01 ENCOUNTER — Other Ambulatory Visit: Payer: Self-pay

## 2022-04-01 ENCOUNTER — Emergency Department (HOSPITAL_COMMUNITY)
Admission: EM | Admit: 2022-04-01 | Discharge: 2022-04-01 | Disposition: A | Discharge status: Home / Self Care | Payer: Medicaid Other | Attending: Emergency Medicine | Admitting: Emergency Medicine

## 2022-04-01 ENCOUNTER — Encounter (HOSPITAL_COMMUNITY): Payer: Self-pay

## 2022-04-01 DIAGNOSIS — Z008 Encounter for other general examination: Secondary | ICD-10-CM

## 2022-04-01 DIAGNOSIS — F319 Bipolar disorder, unspecified: Secondary | ICD-10-CM | POA: Insufficient documentation

## 2022-04-01 DIAGNOSIS — J45909 Unspecified asthma, uncomplicated: Secondary | ICD-10-CM | POA: Insufficient documentation

## 2022-04-01 DIAGNOSIS — Z20822 Contact with and (suspected) exposure to covid-19: Secondary | ICD-10-CM | POA: Insufficient documentation

## 2022-04-01 DIAGNOSIS — Z79899 Other long term (current) drug therapy: Secondary | ICD-10-CM | POA: Diagnosis not present

## 2022-04-01 DIAGNOSIS — F209 Schizophrenia, unspecified: Secondary | ICD-10-CM | POA: Diagnosis not present

## 2022-04-01 DIAGNOSIS — F1994 Other psychoactive substance use, unspecified with psychoactive substance-induced mood disorder: Secondary | ICD-10-CM

## 2022-04-01 DIAGNOSIS — R519 Headache, unspecified: Secondary | ICD-10-CM | POA: Insufficient documentation

## 2022-04-01 DIAGNOSIS — R451 Restlessness and agitation: Secondary | ICD-10-CM | POA: Diagnosis present

## 2022-04-01 LAB — CBC
HCT: 33.8 % — ABNORMAL LOW (ref 39.0–52.0)
Hemoglobin: 11.2 g/dL — ABNORMAL LOW (ref 13.0–17.0)
MCH: 31.5 pg (ref 26.0–34.0)
MCHC: 33.1 g/dL (ref 30.0–36.0)
MCV: 95.2 fL (ref 80.0–100.0)
Platelets: 209 10*3/uL (ref 150–400)
RBC: 3.55 MIL/uL — ABNORMAL LOW (ref 4.22–5.81)
RDW: 13.7 % (ref 11.5–15.5)
WBC: 5.7 10*3/uL (ref 4.0–10.5)
nRBC: 0 % (ref 0.0–0.2)

## 2022-04-01 LAB — BASIC METABOLIC PANEL
Anion gap: 8 (ref 5–15)
BUN: 28 mg/dL — ABNORMAL HIGH (ref 6–20)
CO2: 25 mmol/L (ref 22–32)
Calcium: 9 mg/dL (ref 8.9–10.3)
Chloride: 109 mmol/L (ref 98–111)
Creatinine, Ser: 1.04 mg/dL (ref 0.61–1.24)
GFR, Estimated: >60 mL/min (ref >60)
Glucose, Bld: 78 mg/dL (ref 70–99)
Potassium: 3.7 mmol/L (ref 3.5–5.1)
Sodium: 142 mmol/L (ref 135–145)

## 2022-04-01 LAB — ETHANOL: Alcohol, Ethyl (B): <10 mg/dL (ref <10)

## 2022-04-01 LAB — RESP PANEL BY RT-PCR (FLU A&B, COVID) ARPGX2
Influenza A by PCR: NEGATIVE
Influenza B by PCR: NEGATIVE
SARS Coronavirus 2 by RT PCR: NEGATIVE

## 2022-04-01 LAB — SALICYLATE LEVEL: Salicylate Lvl: <7 mg/dL — ABNORMAL LOW (ref 7.0–30.0)

## 2022-04-01 LAB — ACETAMINOPHEN LEVEL: Acetaminophen (Tylenol), Serum: <10 ug/mL — ABNORMAL LOW (ref 10–30)

## 2022-04-01 MED ORDER — HALOPERIDOL LACTATE 5 MG/ML IJ SOLN
5.0000 mg | Freq: Once | INTRAMUSCULAR | Status: AC
  Administered 2022-04-01: 5 mg via INTRAMUSCULAR
  Filled 2022-04-01: qty 1

## 2022-04-01 MED ORDER — HALOPERIDOL 5 MG PO TABS: 5.0000 mg | ORAL_TABLET | Freq: Every day | ORAL | 0 refills | Status: DC

## 2022-04-01 MED ORDER — LORAZEPAM 1 MG PO TABS
1.0000 mg | ORAL_TABLET | Freq: Once | ORAL | Status: AC
  Administered 2022-04-01: 1 mg via ORAL
  Filled 2022-04-01: qty 1

## 2022-04-01 MED ORDER — IBUPROFEN 200 MG PO TABS
600.0000 mg | ORAL_TABLET | Freq: Three times a day (TID) | ORAL | Status: DC | PRN
  Administered 2022-04-01: 600 mg via ORAL
  Filled 2022-04-01: qty 3

## 2022-04-01 MED ORDER — DIVALPROEX SODIUM ER 500 MG PO TB24: 1000.0000 mg | ORAL_TABLET | Freq: Every day | ORAL | 0 refills | Status: DC

## 2022-04-01 NOTE — Progress Notes (Signed)
CSW added resources in pt's AVS as requested by provider.  Benjaman Kindler, MSW, LCSWA 04/01/2022 2:54 PM

## 2022-04-01 NOTE — ED Triage Notes (Addendum)
Pt arrived via EMS stating he smoked a cigarette laced with fentanyl and now he "feels funny." Pt states he takes haldol and Depakote but feels they aren't working and that he needs a change in meds and a "psych eval". Pt denies SI/HI.

## 2022-04-01 NOTE — ED Provider Notes (Signed)
[facility]-EMERGENCY DEPT Provider Note   CSN: [id number] Arrival date & time: 04/01/22  0540     History  Chief Complaint  Patient presents with   Drug Problem   Psychiatric Evaluation    David Blankenship is a 36 y.o. male with medical history of asthma, ADHD, bipolar 1 disorder, schizophrenia.  The patient presents to the ED for psychiatric evaluation.  Patient reports that prior to arrival, he smoked a cigarette that he believes was laced with fentanyl and now the patient "feels funny".  Throughout the duration of the interaction with the patient, he mumbles incoherently at times.  Patient reports that he takes Depakote and Haldol but states that he believes that his dosages need to be increased.  Patient also requesting psychiatric evaluation.  The patient does endorse a headache. Patient denies any SI/HI, AVH.  The patient denies any chest pain, shortness of breath, nausea, vomiting, diarrhea, abdominal pain, fevers, blood in stool.    Drug Problem Pertinent negatives include no chest pain, no abdominal pain and no shortness of breath.       Home Medications Prior to Admission medications   Medication Sig Start Date End Date Taking? Authorizing Provider  divalproex (DEPAKOTE) 500 MG DR tablet Take 3 tablets (1,500 mg total) by mouth at bedtime. 02/23/22   Tilden Fossa, MD  haloperidol (HALDOL) 10 MG tablet Take 1 tablet (10 mg total) by mouth at bedtime. 02/23/22   Tilden Fossa, MD      Allergies    Acetaminophen, Bee pollen, and Shellfish allergy    Review of Systems   Review of Systems  Constitutional:  Negative for fever.  Respiratory:  Negative for shortness of breath.   Cardiovascular:  Negative for chest pain.  Gastrointestinal:  Negative for abdominal pain, diarrhea, nausea and vomiting.  Psychiatric/Behavioral:  Negative for hallucinations and suicidal ideas.   All other systems reviewed and are negative.   Physical Exam Updated  Vital Signs BP (!) 112/93 (BP Location: Left Arm)   Pulse 61   Temp 97.7 F (36.5 C) (Oral)   Resp 16   SpO2 100%  Physical Exam Vitals and nursing note reviewed.  Constitutional:      General: He is not in acute distress.    Appearance: Normal appearance. He is not ill-appearing, toxic-appearing or diaphoretic.  HENT:     Head: Normocephalic and atraumatic.     Nose: No congestion.     Mouth/Throat:     Mouth: Mucous membranes are moist.     Pharynx: Oropharynx is clear.  Eyes:     Extraocular Movements: Extraocular movements intact.     Conjunctiva/sclera: Conjunctivae normal.     Pupils: Pupils are equal, round, and reactive to light.  Cardiovascular:     Rate and Rhythm: Normal rate and regular rhythm.  Pulmonary:     Effort: Pulmonary effort is normal.     Breath sounds: Normal breath sounds. No wheezing.  Abdominal:     General: Abdomen is flat. Bowel sounds are normal.     Palpations: Abdomen is soft.  Musculoskeletal:     Cervical back: Normal range of motion and neck supple. No tenderness.  Skin:    General: Skin is warm and dry.     Capillary Refill: Capillary refill takes less than 2 seconds.  Neurological:     General: No focal deficit present.     Mental Status: He is alert and oriented to person, place, and time.  GCS: GCS eye subscore is 4. GCS verbal subscore is 5. GCS motor subscore is 6.     Cranial Nerves: Cranial nerves 2-12 are intact. No cranial nerve deficit.     Sensory: Sensation is intact. No sensory deficit.     Motor: Motor function is intact.     Coordination: Coordination is intact. Heel to [facility] Test normal.     ED Results / Procedures / Treatments   Labs (all labs ordered are listed, but only abnormal results are displayed) Labs Reviewed  CBC - Abnormal; Notable for the following components:      Result Value   RBC 3.55 (*)    Hemoglobin 11.2 (*)    HCT 33.8 (*)    All other components within normal limits  BASIC METABOLIC  PANEL - Abnormal; Notable for the following components:   BUN 28 (*)    All other components within normal limits  SALICYLATE LEVEL - Abnormal; Notable for the following components:   Salicylate Lvl <7.0 (*)    All other components within normal limits  ACETAMINOPHEN LEVEL - Abnormal; Notable for the following components:   Acetaminophen (Tylenol), Serum <10 (*)    All other components within normal limits  RESP PANEL BY RT-PCR (FLU A&B, COVID) ARPGX2  ETHANOL  RAPID URINE DRUG SCREEN, HOSP PERFORMED    EKG None  Radiology No results found.  Procedures Procedures   Medications Ordered in ED Medications  ibuprofen (ADVIL) tablet 600 mg (600 mg Oral Given 04/01/22 0838)  LORazepam (ATIVAN) tablet 1 mg (1 mg Oral Given 04/01/22 0838)  haloperidol lactate (HALDOL) injection 5 mg (5 mg Intramuscular Given 04/01/22 1005)    ED Course/ Medical Decision Making/ A&P                           Medical Decision Making Amount and/or Complexity of Data Reviewed Labs: ordered.  Risk OTC drugs. Prescription drug management.   36 year old male presents to the ED for psychiatric evaluation.  Please see HPI for further details.  On examination, patient is afebrile and nontachycardic.  Patient lung sounds clear bilaterally.  Patient abdomen soft compressible all 4 quadrants.  Patient follows commands appropriately, moves all extremities spontaneously.  Patient denies SI, HI, AVH.  Patient worked up utilizing the following labs and imaging studies interpreted by me personally: - Salicylate level unremarkable - Acetaminophen level unremarkable - Ethanol undetectable - CBC shows decrease hemoglobin 11.2 however the patient denies any blood in stool, lightheadedness, dizziness, weakness - BMP has increased BUN to 28, nonspecific  Patient treated with 1 mg Ativan, 5mg  haldol for agitation at nursing staff request.  Patient also treated with 600 mg ibuprofen for headache.  Patient placed in  psych hold, TTS consult pending.  Per TTS provider NP, patient is stable for discharge home. Patient will be discharged home and advised to follow up with [facility]. Patient aware of plan and agreeable to plan. Patient stable at discharge.    Final Clinical Impression(s) / ED Diagnoses Final diagnoses:  Encounter for psychological evaluation    Rx / DC Orders ED Discharge Orders     None         [facility], PA-C 04/01/22 1531    06/01/22, DO 04/03/22 0825

## 2022-04-01 NOTE — ED Notes (Addendum)
Pt talking to self, with minor agitation. Pt walked down hallway, but was easily redirected back into their bed. Pt had a verbal altercation with other hall pt. Security called and standing by in case of escalation.
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
## 2022-04-01 NOTE — ED Notes (Signed)
Pts kitchen knife given to security, pt aware of this.

## 2022-04-01 NOTE — ED Notes (Signed)
Pt brought in a knife, that was given to security to be locked away for pt's duration in the ED.

## 2022-04-01 NOTE — ED Notes (Signed)
Pt's knife returned to them from security locker

## 2022-04-07 ENCOUNTER — Encounter (HOSPITAL_COMMUNITY): Payer: Self-pay

## 2022-04-07 ENCOUNTER — Emergency Department (HOSPITAL_COMMUNITY)
Admission: EM | Admit: 2022-04-07 | Discharge: 2022-04-08 | Discharge status: Against Medical Advice | Payer: Medicaid Other | Attending: Emergency Medicine | Admitting: Emergency Medicine

## 2022-04-07 ENCOUNTER — Other Ambulatory Visit: Payer: Self-pay

## 2022-04-07 DIAGNOSIS — M549 Dorsalgia, unspecified: Secondary | ICD-10-CM | POA: Diagnosis not present

## 2022-04-07 DIAGNOSIS — Z5321 Procedure and treatment not carried out due to patient leaving prior to being seen by health care provider: Secondary | ICD-10-CM | POA: Insufficient documentation

## 2022-04-07 DIAGNOSIS — Z760 Encounter for issue of repeat prescription: Secondary | ICD-10-CM | POA: Insufficient documentation
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
## 2022-07-19 ENCOUNTER — Emergency Department (HOSPITAL_COMMUNITY): Payer: Medicaid Other

## 2022-07-19 ENCOUNTER — Emergency Department (HOSPITAL_COMMUNITY)
Admission: EM | Admit: 2022-07-19 | Discharge: 2022-07-20 | Disposition: A | Discharge status: Other Acute Inpatient Hospital | Payer: Medicaid Other | Attending: Emergency Medicine | Admitting: Emergency Medicine

## 2022-07-19 DIAGNOSIS — Z1339 Encounter for screening examination for other mental health and behavioral disorders: Secondary | ICD-10-CM | POA: Diagnosis not present

## 2022-07-19 DIAGNOSIS — Z20822 Contact with and (suspected) exposure to covid-19: Secondary | ICD-10-CM | POA: Diagnosis not present

## 2022-07-19 DIAGNOSIS — F201 Disorganized schizophrenia: Secondary | ICD-10-CM | POA: Diagnosis not present

## 2022-07-19 DIAGNOSIS — R4182 Altered mental status, unspecified: Secondary | ICD-10-CM

## 2022-07-19 LAB — COMPREHENSIVE METABOLIC PANEL
ALT: 33 U/L (ref 0–44)
AST: 43 U/L — ABNORMAL HIGH (ref 15–41)
Albumin: 4 g/dL (ref 3.5–5.0)
Alkaline Phosphatase: 42 U/L (ref 38–126)
Anion gap: 15 (ref 5–15)
BUN: 18 mg/dL (ref 6–20)
CO2: 21 mmol/L — ABNORMAL LOW (ref 22–32)
Calcium: 9.6 mg/dL (ref 8.9–10.3)
Chloride: 106 mmol/L (ref 98–111)
Creatinine, Ser: 0.82 mg/dL (ref 0.61–1.24)
GFR, Estimated: 59 mL/min — ABNORMAL LOW (ref >60)
Glucose, Bld: 79 mg/dL (ref 70–99)
Potassium: 4.1 mmol/L (ref 3.5–5.1)
Sodium: 142 mmol/L (ref 135–145)
Total Bilirubin: 0.7 mg/dL (ref 0.3–1.2)
Total Protein: 6.8 g/dL (ref 6.5–8.1)

## 2022-07-19 LAB — RAPID URINE DRUG SCREEN, HOSP PERFORMED
Amphetamines: NOT DETECTED
Barbiturates: NOT DETECTED
Benzodiazepines: NOT DETECTED
Cocaine: NOT DETECTED
Opiates: NOT DETECTED
Tetrahydrocannabinol: NOT DETECTED

## 2022-07-19 LAB — CBC WITH DIFFERENTIAL/PLATELET
Abs Immature Granulocytes: 0.05 10*3/uL (ref 0.00–0.07)
Basophils Absolute: 0 10*3/uL (ref 0.0–0.1)
Basophils Relative: 0 %
Eosinophils Absolute: 0 10*3/uL (ref 0.0–0.5)
Eosinophils Relative: 0 %
HCT: 37.1 % — ABNORMAL LOW (ref 39.0–52.0)
Hemoglobin: 12.2 g/dL — ABNORMAL LOW (ref 13.0–17.0)
Immature Granulocytes: 1 %
Lymphocytes Relative: 18 %
Lymphs Abs: 1.7 10*3/uL (ref 0.7–4.0)
MCH: 29.1 pg (ref 26.0–34.0)
MCHC: 32.9 g/dL (ref 30.0–36.0)
MCV: 88.5 fL (ref 80.0–100.0)
Monocytes Absolute: 0.7 10*3/uL (ref 0.1–1.0)
Monocytes Relative: 8 %
Neutro Abs: 6.8 10*3/uL (ref 1.7–7.7)
Neutrophils Relative %: 73 %
Platelets: 245 10*3/uL (ref 150–400)
RBC: 4.19 MIL/uL — ABNORMAL LOW (ref 4.22–5.81)
RDW: 13.6 % (ref 11.5–15.5)
WBC: 9.3 10*3/uL (ref 4.0–10.5)
nRBC: 0 % (ref 0.0–0.2)

## 2022-07-19 LAB — URINALYSIS, ROUTINE W REFLEX MICROSCOPIC
Bilirubin Urine: NEGATIVE
Glucose, UA: NEGATIVE mg/dL
Hgb urine dipstick: NEGATIVE
Ketones, ur: NEGATIVE mg/dL
Leukocytes,Ua: NEGATIVE
Nitrite: NEGATIVE
Protein, ur: NEGATIVE mg/dL
Specific Gravity, Urine: 1.008 (ref 1.005–1.030)
pH: 5 (ref 5.0–8.0)

## 2022-07-19 LAB — ETHANOL: Alcohol, Ethyl (B): <10 mg/dL (ref <10)

## 2022-07-19 MED ORDER — DIPHENHYDRAMINE HCL 50 MG/ML IJ SOLN
50.0000 mg | Freq: Three times a day (TID) | INTRAMUSCULAR | Status: DC | PRN
  Administered 2022-07-19 – 2022-07-20 (×3): 50 mg via INTRAMUSCULAR
  Filled 2022-07-19 (×3): qty 1

## 2022-07-19 MED ORDER — LORAZEPAM 1 MG PO TABS
1.0000 mg | ORAL_TABLET | Freq: Three times a day (TID) | ORAL | Status: DC | PRN
  Filled 2022-07-19: qty 1

## 2022-07-19 MED ORDER — LORAZEPAM 2 MG/ML IJ SOLN: 1.0000 mg | Freq: Three times a day (TID) | INTRAMUSCULAR | Status: DC | PRN

## 2022-07-19 MED ORDER — ZIPRASIDONE MESYLATE 20 MG IM SOLR
10.0000 mg | Freq: Once | INTRAMUSCULAR | Status: DC
  Filled 2022-07-19: qty 20

## 2022-07-19 MED ORDER — HALOPERIDOL 5 MG PO TABS: 5.0000 mg | ORAL_TABLET | Freq: Four times a day (QID) | ORAL | Status: DC | PRN

## 2022-07-19 MED ORDER — STERILE WATER FOR INJECTION IJ SOLN
INTRAMUSCULAR | Status: AC
  Filled 2022-07-19: qty 10

## 2022-07-19 MED ORDER — HALOPERIDOL LACTATE 5 MG/ML IJ SOLN
5.0000 mg | Freq: Three times a day (TID) | INTRAMUSCULAR | Status: DC | PRN
  Administered 2022-07-19 – 2022-07-20 (×3): 5 mg via INTRAMUSCULAR
  Filled 2022-07-19 (×3): qty 1

## 2022-07-19 MED ORDER — ZIPRASIDONE MESYLATE 20 MG IM SOLR
10.0000 mg | Freq: Once | INTRAMUSCULAR | Status: AC
  Administered 2022-07-19: 10 mg via INTRAMUSCULAR
  Filled 2022-07-19: qty 20

## 2022-07-19 MED ORDER — DIVALPROEX SODIUM 500 MG PO DR TAB
500.0000 mg | DELAYED_RELEASE_TABLET | Freq: Two times a day (BID) | ORAL | Status: DC
  Administered 2022-07-19 – 2022-07-20 (×2): 500 mg via ORAL
  Filled 2022-07-19 (×2): qty 1

## 2022-07-19 NOTE — Progress Notes (Signed)
Inpatient Behavioral Health Placement  Pt meets inpatient criteria per Merlyn Lot, NP.There are no available beds at [facility] per [facility].  Referral was sent to the following facilities;   Destination Service Provider Address Phone Fax  [facility]  [place], [place] [address] [address]  [facility]  [address] [place] [address] [phone]  [facility]  [address] [place] [address] [phone] [facility]  [address] [place] [address] [phone]  [facility]  [address] [place] [address] [phone] [place]  [address], [place] [address] [phone]  [place] [address] [place] [address] [phone]  [facility]  [address], [place] [address] [phone] [place]  [address] [place] [address] [phone]  [facility]  [address] [place] [address] [facility]  [address] [place] [address] [phone]    Situation ongoing,  CSW will follow up.   Benjaman Kindler, MSW, [facility] 07/19/2022  @ 11:12 PM
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
## 2022-07-19 NOTE — ED Notes (Signed)
Patient sleeping, due to prior behavior, will not wake for vitals.

## 2022-07-19 NOTE — ED Notes (Signed)
PT IS YELLING AND CUSSING AND NOT LISTING, PT KEEP PUSHING THE CODE BUTTON, NURSE IS GETTING READY TO ADMINISTER IV MEDICATION .

## 2022-07-19 NOTE — ED Notes (Signed)
Unable to obtain VS or any further information during triage d/t agitation and psychotic state

## 2022-07-19 NOTE — ED Notes (Signed)
Patient sleeping

## 2022-07-19 NOTE — ED Notes (Signed)
PT was IVCed with a [place] name, once IVC'ed pt name was updated, called magistrate office, was told to write pts name beside the DOE account name. A second officer signed off on the paperwork. The original copy is on red folder, and 3 copies on purple clip board. 1 copy in medical records.

## 2022-07-19 NOTE — ED Notes (Signed)
Patient brought from room 14. 2 patient' belongings bag of inventoried items placed in locker 2

## 2022-07-19 NOTE — Progress Notes (Addendum)
Pt was accepted to [facility] 07/20/22; BED ASSIGNMENT Althia Forts A  Pt meets inpatient criteria per   Attending Physician will be Dr. Reece Levy  Report can be called to: [phone]  Pt can arrive after 7:00am  Care Team notified: Delora Fuel, MD, Wright, RN, and [facility] AC Lynnda Shields, RN,Frances [place], RN, and Merlyn Lot, NP   Nadara Mode, [place] 07/19/2022 @ 11:21 PM

## 2022-07-19 NOTE — ED Notes (Signed)
Patient keeps attempting to take things off he bed, pull the plugs off the bed. Bed removed from room.

## 2022-07-19 NOTE — ED Notes (Signed)
Patient awoke from sleep around 2330 and has become increasingly agitated, yelling and cursing loudly. [employer] and security present; patient continues to walk into restroom flushing toilet over and over again and pressing the CODE blue button in room. Patient is not listening when asked not to do this and continues to do so; this RN preparing to administer prn medications for agitation.

## 2022-07-19 NOTE — ED Provider Notes (Signed)
Patient has been accepted at [facility], accepting physician is Dr. Reece Levy.   Delora Fuel, MD 22/33/61 [phone]

## 2022-07-19 NOTE — ED Notes (Signed)
Pt appears to be resting more comfortably. [employer] remains at bedside, sleeping soundly in bed. Equal chest rise and fall noted

## 2022-07-19 NOTE — ED Notes (Signed)
Patient on TTS, speaking rapidly, not responding to NP. Patient is very obtrusive with other patients. In and out of their rooms. Very loud speech. The other patients reactive to his behavior.

## 2022-07-19 NOTE — ED Provider Notes (Signed)
[facility] EMERGENCY DEPARTMENT Provider Note   CSN: [id number] Arrival date & time: 07/19/22  0846     History  Chief Complaint  Patient presents with   Psychiatric Evaluation    David Blankenship is a 36 y.o. male.  Patient was in the neighborhood trying to break into houses and destroyed mailboxes.  The police arrived and he was aggressive with them but he was not making any sense while he was talking.  We do not know the patient's name and do not know of any medical problems or psychiatric history  The history is provided by the police. No language interpreter was used.  Altered Mental Status Presenting symptoms: behavior changes   Severity:  Severe Most recent episode: Unknown how long. Episode history:  Single Timing:  Constant Progression:  Unchanged Chronicity: Unknown. Context: not alcohol use   Associated symptoms: no abdominal pain        Home Medications Prior to Admission medications   Not on File      Allergies    Patient has no allergy information on record.    Review of Systems   Review of Systems  Unable to perform ROS: Mental status change  Gastrointestinal:  Negative for abdominal pain.    Physical Exam Updated Vital Signs BP 136/78   Pulse 62   Temp 98.5 F (36.9 C) (Oral)   Resp 18   SpO2 100%  Physical Exam Vitals and nursing note reviewed.  Constitutional:      Appearance: He is well-developed.  HENT:     Head: Normocephalic.     Nose: Nose normal.  Eyes:     General: No scleral icterus.    Conjunctiva/sclera: Conjunctivae normal.  Neck:     Thyroid: No thyromegaly.  Cardiovascular:     Rate and Rhythm: Normal rate and regular rhythm.     Heart sounds: No murmur heard.    No friction rub. No gallop.  Pulmonary:     Breath sounds: No stridor. No wheezing or rales.  Chest:     Chest wall: No tenderness.  Abdominal:     General: There is no distension.     Tenderness: There is no abdominal tenderness.  There is no rebound.  Musculoskeletal:        General: Normal range of motion.     Cervical back: Neck supple.  Lymphadenopathy:     Cervical: No cervical adenopathy.  Skin:    Findings: No erythema or rash.  Neurological:     Mental Status: He is alert.     Motor: No abnormal muscle tone.     Coordination: Coordination normal.     Comments: Patient alert.  He will not answer questions except he states he does not want anything and he is okay.  Patient moves all extremities without problems.  Psychiatric:     Comments: Patient was aggressive towards the police and staff and had to be given some Geodon to get labs and x-rays done.  He will not communicate except for the answering he does not need anything     ED Results / Procedures / Treatments   Labs (all labs ordered are listed, but only abnormal results are displayed) Labs Reviewed  CBC WITH DIFFERENTIAL/PLATELET - Abnormal; Notable for the following components:      Result Value   RBC 4.19 (*)    Hemoglobin 12.2 (*)    HCT 37.1 (*)    All other components within normal limits  COMPREHENSIVE  METABOLIC PANEL - Abnormal; Notable for the following components:   CO2 21 (*)    AST 43 (*)    GFR, Estimated 59 (*)    All other components within normal limits  RESP PANEL BY RT-PCR (FLU A&B, COVID) ARPGX2  ETHANOL  URINALYSIS, ROUTINE W REFLEX MICROSCOPIC  RAPID URINE DRUG SCREEN, HOSP PERFORMED    EKG None  Radiology CT Head Wo Contrast  Result Date: 07/19/2022 CLINICAL DATA:  Mental status change, unknown cause EXAM: CT HEAD WITHOUT CONTRAST TECHNIQUE: Contiguous axial images were obtained from the base of the skull through the vertex without intravenous contrast. RADIATION DOSE REDUCTION: This exam was performed according to the departmental dose-optimization program which includes automated exposure control, adjustment of the mA and/or kV according to patient size and/or use of iterative reconstruction technique.  COMPARISON:  None Available. FINDINGS: Brain: No evidence of acute infarction, hemorrhage, hydrocephalus, extra-axial collection or mass lesion/mass effect. Vascular: No hyperdense vessel. Skull: No acute fracture. Sinuses/Orbits: No acute findings. Other: No mastoid effusions. IMPRESSION: No evidence of acute intracranial abnormality. Electronically Signed   By: Feliberto Harts M.D.   On: 07/19/2022 10:48    Procedures Procedures    Medications Ordered in ED Medications  sterile water (preservative free) injection (has no administration in time range)  ziprasidone (GEODON) injection 10 mg (has no administration in time range)  ziprasidone (GEODON) injection 10 mg (10 mg Intramuscular Given 07/19/22 0913)    ED Course/ Medical Decision Making/ A&P                           Medical Decision Making Amount and/or Complexity of Data Reviewed Labs: ordered. Radiology: ordered.  Risk Prescription drug management.  This patient presents to the ED for concern of bizarre behavior, this involves an extensive number of treatment options, and is a complaint that carries with it a high risk of complications and morbidity.  The differential diagnosis includes sepsis, schizophrenia   Co morbidities that complicate the patient evaluation  Unknown   Additional history obtained:  Additional history obtained from police External records from outside source obtained and reviewed including hospital records   Lab Tests:  I Ordered, and personally interpreted labs.  The pertinent results include: CBC and chemistries unremarkable   Imaging Studies ordered:  I ordered imaging studies including CT head I independently visualized and interpreted imaging which showed negative I agree with the radiologist interpretation   Cardiac Monitoring: / EKG:  The patient was maintained on a cardiac monitor.  I personally viewed and interpreted the cardiac monitored which showed an underlying rhythm of:  Normal sinus rhythm   Consultations Obtained:  I requested consultation with the psychiatry,  and discussed lab and imaging findings as well as pertinent plan - they recommend: Admission to psychiatry   Problem List / ED Course / Critical interventions / Medication management  Psychosis I ordered medication including Geodon, patient has Reevaluation of the patient after these medicines showed that the patient improved I have reviewed the patients home medicines and have made adjustments as needed   Social Determinants of Health:  None   Test / Admission - Considered:  None  Patient's blood work and CT scan are unremarkable.  Patient is medically cleared and will be referred to psychiatry for evaluation.        Final Clinical Impression(s) / ED Diagnoses Final diagnoses:  None    Rx / DC Orders ED Discharge Orders  None         Milton Ferguson, MD 07/20/22 [phone]

## 2022-07-19 NOTE — ED Triage Notes (Signed)
Pt BIB [employer] for psychiatric evaluation. [employer] received several calls for pt creating a nuisance in the community. Pt was apparently attempting to destroy mailboxes, break into houses. Arrives in manic state w/ pressured speech and gang perseveration. Pt is non cooperative w/ [employer], in handcuffs.

## 2022-07-20 MED ORDER — STERILE WATER FOR INJECTION IJ SOLN
INTRAMUSCULAR | Status: AC
  Filled 2022-07-20: qty 10

## 2022-07-20 MED ORDER — STERILE WATER FOR INJECTION IJ SOLN
10.0000 mL | INTRAMUSCULAR | Status: AC
  Administered 2022-07-20: 1.2 mL via INTRAMUSCULAR

## 2022-07-20 MED ORDER — ZIPRASIDONE MESYLATE 20 MG IM SOLR
20.0000 mg | Freq: Once | INTRAMUSCULAR | Status: AC
  Administered 2022-07-20: 20 mg via INTRAMUSCULAR
  Filled 2022-07-20: qty 20

## 2022-07-20 NOTE — ED Notes (Addendum)
Pt was accepted to [facility] 07/20/22; BED ASSIGNMENT Althia Forts A  Pt meets inpatient criteria per   Attending Physician will be Dr. Reece Levy  Report can be called to: [phone]  Pt can arrive after 7:00am  Per Shelton Silvas, LCSW

## 2022-07-20 NOTE — ED Notes (Signed)
Report given to [place], Therapist, sports at [facility].

## 2022-07-20 NOTE — ED Notes (Signed)
Patient continues to press CODE blue button despite my request for him not to. Charge RN is aware.

## 2022-07-20 NOTE — ED Notes (Signed)
Pt agreeable to take depakote. Pt refused ativan " I ain't taking that makes me sleepy. I don't want that." Pt pacing in room and mumbling.

## 2022-07-20 NOTE — ED Notes (Signed)
Discussed transfer with RN Percell Locus; then called Verita Lamb for transport to [facility].

## 2022-07-20 NOTE — ED Notes (Signed)
Pt left with all paperwork and belongings with the Sheriff's dept.

## 2022-07-20 NOTE — ED Notes (Signed)
Pt pushing code button in room. Pt redirected. Pt aware that is for emergencies. Requested pt to turn code alarm off. Pt turned off alarm.

## 2022-07-20 NOTE — ED Notes (Signed)
Called [facility] For transport to [facility] and left a voicemail.

## 2022-07-20 NOTE — ED Notes (Addendum)
Pt at desk. Pt requesting to call grandmother. Pt punching buttons randomly on phone. Pt spoke into phone having sporadic conversations with various individuals. Pt cooperative at this time.

## 2022-07-20 NOTE — ED Notes (Signed)
Patient grabbed food from trash can that was disposed earlier this evening and is eating it and eating food off the floor. This RN attempted multiple times to redirect patient; patient appears to be responding to internal stimuli. Patient is "mumbling under his breath"; speech is incomprehensible at times.

## 2022-07-20 NOTE — ED Notes (Signed)
Patient sleeping upright in chair.

## 2022-07-20 NOTE — ED Notes (Signed)
[employer] again.  Deputy stated that they "got busy" and it will "probably be around 10:30-10:45". Informed RN

## 2022-07-20 NOTE — ED Notes (Signed)
Patient continues to be agitated and belligerent; this RN tried multiple attempts to redirect and calm patient down with no success. Notified Dr. Roxanne Mins.

## 2022-09-26 ENCOUNTER — Other Ambulatory Visit: Payer: Self-pay

## 2022-09-26 ENCOUNTER — Emergency Department (HOSPITAL_COMMUNITY)
Admission: EM | Admit: 2022-09-26 | Discharge: 2022-09-27 | Payer: Medicaid Other | Attending: Emergency Medicine | Admitting: Emergency Medicine

## 2022-09-26 DIAGNOSIS — M7989 Other specified soft tissue disorders: Secondary | ICD-10-CM

## 2022-09-26 DIAGNOSIS — L03115 Cellulitis of right lower limb: Secondary | ICD-10-CM | POA: Insufficient documentation

## 2022-09-26 DIAGNOSIS — R2241 Localized swelling, mass and lump, right lower limb: Secondary | ICD-10-CM | POA: Diagnosis present

## 2022-09-26 NOTE — ED Triage Notes (Signed)
Patient coming to ED for evaluation of R foot swelling.  Reports symptoms started yesterday.  No reports of injury or fall.  Patient reports swelling is getting worse and "going up his leg."

## 2022-09-26 NOTE — ED Notes (Signed)
Pt. Refused labs, "pt. thinks we are going to kill him."

## 2022-09-26 NOTE — ED Provider Triage Note (Addendum)
Emergency Medicine Provider Triage Evaluation Note  David Blankenship , a 36 y.o. male  was evaluated in triage.  Pt complains of right lower extremity pain and swelling.  Started yesterday from what we can tell.  Patient has a history of disorganized schiophrenia, in custody of [employer]. Denies CP or SOB.  Review of Systems  Per HPI  Physical Exam  BP 128/82 (BP Location: Left Arm)   Pulse 60   Temp 97.9 F (36.6 C) (Oral)   Resp 16   Ht 5\' 10"  (1.778 m)   Wt 77.1 kg   SpO2 100%   BMI 24.39 kg/m  Gen:   Awake, no distress   Resp:  Normal effort  MSK:   Moves extremities without difficulty  Other:  Edema to right ankle with some erythema and warmth.  Tender to palpation to the posterior calf.  Pulses are palpable bilaterally.  Medical Decision Making  Medically screening exam initiated at 9:00 PM.  Appropriate orders placed.  was informed that the remainder of the evaluation will be completed by another provider, this initial triage assessment does not replace that evaluation, and the importance of remaining in the ED until their evaluation is complete.  Physical exam is suspicious for DVT.  Will check basic labs, ultrasound is left for the evening so likely will treat empirically and have him return in the morning.  [employer] sure if they can bring the patient back tomorrow for ultrasound when they are available.   [name], PA-C    Theron Arista, Theron Arista 09/26/22 2106

## 2022-09-27 LAB — CBC WITH DIFFERENTIAL/PLATELET
Abs Immature Granulocytes: 0.01 10*3/uL (ref 0.00–0.07)
Basophils Absolute: 0 10*3/uL (ref 0.0–0.1)
Basophils Relative: 0 %
Eosinophils Absolute: 0 10*3/uL (ref 0.0–0.5)
Eosinophils Relative: 1 %
HCT: 35.2 % — ABNORMAL LOW (ref 39.0–52.0)
Hemoglobin: 11.5 g/dL — ABNORMAL LOW (ref 13.0–17.0)
Immature Granulocytes: 0 %
Lymphocytes Relative: 37 %
Lymphs Abs: 1.3 10*3/uL (ref 0.7–4.0)
MCH: 28.8 pg (ref 26.0–34.0)
MCHC: 32.7 g/dL (ref 30.0–36.0)
MCV: 88 fL (ref 80.0–100.0)
Monocytes Absolute: 0.3 10*3/uL (ref 0.1–1.0)
Monocytes Relative: 10 %
Neutro Abs: 1.8 10*3/uL (ref 1.7–7.7)
Neutrophils Relative %: 52 %
Platelets: 208 10*3/uL (ref 150–400)
RBC: 4 MIL/uL — ABNORMAL LOW (ref 4.22–5.81)
RDW: 14.5 % (ref 11.5–15.5)
WBC: 3.4 10*3/uL — ABNORMAL LOW (ref 4.0–10.5)
nRBC: 0 % (ref 0.0–0.2)

## 2022-09-27 LAB — BASIC METABOLIC PANEL
Anion gap: 8 (ref 5–15)
BUN: 23 mg/dL — ABNORMAL HIGH (ref 6–20)
CO2: 24 mmol/L (ref 22–32)
Calcium: 8.9 mg/dL (ref 8.9–10.3)
Chloride: 104 mmol/L (ref 98–111)
Creatinine, Ser: 0.65 mg/dL (ref 0.61–1.24)
GFR, Estimated: >60 mL/min (ref >60)
Glucose, Bld: 123 mg/dL — ABNORMAL HIGH (ref 70–99)
Potassium: 3.4 mmol/L — ABNORMAL LOW (ref 3.5–5.1)
Sodium: 136 mmol/L (ref 135–145)

## 2022-09-27 LAB — D-DIMER, QUANTITATIVE: D-Dimer, Quant: 0.33 ug/mL-FEU (ref 0.00–0.50)

## 2022-09-27 MED ORDER — CEPHALEXIN 500 MG PO CAPS: 500.0000 mg | ORAL_CAPSULE | Freq: Four times a day (QID) | ORAL | 0 refills | Status: DC

## 2022-09-27 MED ORDER — CEPHALEXIN 500 MG PO CAPS: 500.0000 mg | ORAL_CAPSULE | Freq: Four times a day (QID) | ORAL | 0 refills | Status: AC

## 2022-09-27 MED ORDER — LORAZEPAM 2 MG/ML IJ SOLN
1.0000 mg | Freq: Once | INTRAMUSCULAR | Status: AC
  Administered 2022-09-27: 1 mg via INTRAMUSCULAR
  Filled 2022-09-27: qty 1

## 2022-09-27 MED ORDER — ZIPRASIDONE MESYLATE 20 MG IM SOLR
20.0000 mg | Freq: Once | INTRAMUSCULAR | Status: AC
  Administered 2022-09-27: 20 mg via INTRAMUSCULAR
  Filled 2022-09-27: qty 20

## 2022-09-27 MED ORDER — STERILE WATER FOR INJECTION IJ SOLN
INTRAMUSCULAR | Status: AC
  Administered 2022-09-27: 10 mL
  Filled 2022-09-27: qty 10

## 2022-09-27 NOTE — Discharge Instructions (Addendum)
Concerned you might have the beginning of cellulitis of your right leg, starting you on antibiotics please take as prescribed.  I would like you to follow-up with your primary care provider for further evaluation    Come back to the emergency department if you develop chest pain, shortness of breath, severe abdominal pain, uncontrolled nausea, vomiting, diarrhea.

## 2022-09-27 NOTE — ED Provider Notes (Signed)
[name]-EMERGENCY DEPT Provider Note   CSN: [id number] Arrival date & time: 09/26/22  2019     History  Chief Complaint  Patient presents with   Foot Pain    David Blankenship is a 36 y.o. male.  HPI   Medical history including bipolar, seizures, schizophrenia presents from jail in police custody due to worsening right leg swelling, patient was unable to provide any HPI, per police who are at bedside states that over the last couple days patient had worsening leg swelling, nursing staff at the jail was concerned for possible cellulitis and/or DVT.  They state that he is at his normal baseline, will usually get agitated, and will not answer any questions.  They are not aware of any known injury to that leg. When  I tried speak with the patient,  he would not answer any my questions nor would he allow me to full yevaluate him.  Reviewed patient's chart patient is violent, previous visits patient had to be sedated with Geodon for a proper evaluation.     Home Medications Prior to Admission medications   Medication Sig Start Date End Date Taking? Authorizing Provider  cephALEXin (KEFLEX) 500 MG capsule Take 1 capsule (500 mg total) by mouth 4 (four) times daily for 6 days. 09/27/22 10/03/22  Carroll Sage, PA-C  divalproex (DEPAKOTE ER) 500 MG 24 hr tablet Take 2 tablets (1,000 mg total) by mouth at bedtime. 04/01/22 05/01/22  Earney Navy, NP  haloperidol (HALDOL) 5 MG tablet Take 1 tablet (5 mg total) by mouth at bedtime. 04/01/22 05/01/22  Earney Navy, NP      Allergies    Acetaminophen, Bee pollen, and Shellfish allergy    Review of Systems   Review of Systems  Unable to perform ROS: Psychiatric disorder    Physical Exam Updated Vital Signs BP 116/79   Pulse 73   Temp 97.9 F (36.6 C) (Oral)   Resp 17   Ht 5\' 10"  (1.778 m)   Wt 77.1 kg   SpO2 100%   BMI 24.39 kg/m  Physical Exam Vitals and nursing note reviewed.   Constitutional:      General: He is not in acute distress.    Appearance: He is not ill-appearing.  HENT:     Head: Normocephalic and atraumatic.     Comments: No deformity of the head present no raccoon eyes or Battle sign noted.    Nose: No congestion.  Eyes:     Conjunctiva/sclera: Conjunctivae normal.  Cardiovascular:     Rate and Rhythm: Normal rate and regular rhythm.  Pulmonary:     Effort: Pulmonary effort is normal.  Musculoskeletal:     Comments: Very limited exam but there is notable right leg swelling, with 2+ pitting edema, unable to palpate for pulses as patient would not allow me to.  Skin:    General: Skin is warm and dry.  Neurological:     Mental Status: He is alert.     Comments: No facial asymmetry no difficulty with word finding following two-step commands there is no unilateral weakness present.  Psychiatric:        Mood and Affect: Mood normal.     Comments: Patient is agitated, appears to be responding to internal stimuli,     ED Results / Procedures / Treatments   Labs (all labs ordered are listed, but only abnormal results are displayed) Labs Reviewed  CBC WITH DIFFERENTIAL/PLATELET - Abnormal; Notable for the following  components:      Result Value   WBC 3.4 (*)    RBC 4.00 (*)    Hemoglobin 11.5 (*)    HCT 35.2 (*)    All other components within normal limits  BASIC METABOLIC PANEL - Abnormal; Notable for the following components:   Potassium 3.4 (*)    Glucose, Bld 123 (*)    BUN 23 (*)    All other components within normal limits  D-DIMER, QUANTITATIVE    EKG EKG Interpretation  Date/Time:  Thursday September 27 2022 02:07:13 EST Ventricular Rate:  56 PR Interval:  146 QRS Duration: 89 QT Interval:  430 QTC Calculation: 415 R Axis:   87 Text Interpretation: Sinus rhythm Consider left ventricular hypertrophy Confirmed by Gilda Crease [phone]) on 09/27/2022 2:10:52 AM  Radiology No results found.  Procedures Procedures     Medications Ordered in ED Medications  ziprasidone (GEODON) injection 20 mg (20 mg Intramuscular Given 09/27/22 0206)  LORazepam (ATIVAN) injection 1 mg (1 mg Intramuscular Given 09/27/22 0206)  sterile water (preservative free) injection (10 mLs  Given 09/27/22 0207)    ED Course/ Medical Decision Making/ A&P                           Medical Decision Making Amount and/or Complexity of Data Reviewed Labs: ordered.  Risk Prescription drug management.   This patient presents to the ED for concern of leg swelling, this involves an extensive number of treatment options, and is a complaint that carries with it a high risk of complications and morbidity.  The differential diagnosis includes DVT, cellulitis, septic arthritis    Additional history obtained:  Additional history obtained from police External records from outside source obtained and reviewed including recent ER notes   Co morbidities that complicate the patient evaluation  Psychiatric disorder  Social Determinants of Health:  Homeless    Lab Tests:  I Ordered, and personally interpreted labs.  The pertinent results include: CBC shows normocytic anemia hemoglobin 11.5, BMP shows potassium 3.4, glucose 123, BUN 23, D-dimer is negative   Imaging Studies ordered:  I ordered imaging studies including N/A I independently visualized and interpreted imaging which showed N/A I agree with the radiologist interpretation   Cardiac Monitoring:  The patient was maintained on a cardiac monitor.  I personally viewed and interpreted the cardiac monitored which showed an underlying rhythm of: Without signs of ischemia  Medicines ordered and prescription drug management:  I ordered medication including Geodon, Ativan I have reviewed the patients home medicines and have made adjustments as needed  Critical Interventions:  N/A   Reevaluation:  Patient is very agitated, and will not follow any my commands, he is  placing staff as well as myself in danger, patient is not alert and oriented, cannot make clear medical decisions, will provide patient with Ativan as well as Geodon to fully assess the patient.  After patient was given Ativan and Geodon, he was more calm, and we were able to collect lab work, which was unremarkable, I was able to fully assess patient's right leg, he has 2+ pitting edema up to the knee, there is slight erythema noted around the ankle, no open wounds, he has no palpable cords, calf is nontender, he has 2+ dorsal pedal pulses bilaterally, neurologically intact in the lower extremities.      Consultations Obtained:  N/a    Test Considered:  N/a    Rule out  Low suspicion for compartment syndrome as area was palpated it was soft to the touch, neurovascular fully intact.  Suspicion for limb ischemia is low at this time as he has 2+ dorsal pedal pulses, sensation intact to light touch, 2-second capillary refill, no evidence of ischemia present my examination.  My suspicion for DVT as D-dimer is negative.  My suspicion for fracture or dislocation is also low as there is no traumatic injury associate with this, he has no tenderness during my examination.  My suspicion for CHF exacerbation is also low at this time as he has no new oxygen requirements, lung sounds are clear, would SPECT swelling bilaterally not just to the right lower extremity.     Dispostion and problem list  After consideration of the diagnostic results and the patients response to treatment, I feel that the patent would benefit from discharge.  Leg swelling-patient does have swelling on my examination, suspect this is multifactorial, there is some erythema present during my exam I am concerned for possible early cellulitis, will start him on antibiotics, will have him follow-up with PCP for reassessment.            Final Clinical Impression(s) / ED Diagnoses Final diagnoses:  Leg swelling   Cellulitis of right lower extremity    Rx / DC Orders ED Discharge Orders          Ordered    cephALEXin (KEFLEX) 500 MG capsule  4 times daily,   Status:  Discontinued        09/27/22 0548    cephALEXin (KEFLEX) 500 MG capsule  4 times daily        09/27/22 0549              Carroll Sage, PA-C 09/27/22 0549    Gilda Crease, MD 09/27/22 [phone]

## 2022-10-10 ENCOUNTER — Emergency Department (HOSPITAL_COMMUNITY)
Admission: EM | Admit: 2022-10-10 | Discharge: 2022-10-10 | Discharge status: Against Medical Advice | Payer: Medicaid Other

## 2023-02-07 ENCOUNTER — Encounter (HOSPITAL_COMMUNITY): Payer: Self-pay | Admitting: Psychiatry

## 2023-02-07 ENCOUNTER — Ambulatory Visit (INDEPENDENT_AMBULATORY_CARE_PROVIDER_SITE_OTHER): Payer: Medicaid Other | Admitting: Psychiatry

## 2023-02-07 VITALS — BP 114/66 | HR 105 | Ht 70.0 in | Wt 170.0 lb

## 2023-02-07 DIAGNOSIS — F209 Schizophrenia, unspecified: Secondary | ICD-10-CM

## 2023-02-07 DIAGNOSIS — F1220 Cannabis dependence, uncomplicated: Secondary | ICD-10-CM

## 2023-02-07 MED ORDER — HALOPERIDOL 10 MG PO TABS: 15.0000 mg | ORAL_TABLET | Freq: Every evening | ORAL | 1 refills | Status: DC

## 2023-02-07 MED ORDER — HALOPERIDOL DECANOATE 100 MG/ML IM SOLN: 100.0000 mg | INTRAMUSCULAR | 11 refills | Status: DC

## 2023-02-07 MED ORDER — DIVALPROEX SODIUM ER 500 MG PO TB24: 1500.0000 mg | ORAL_TABLET | Freq: Every day | ORAL | 3 refills | Status: DC

## 2023-02-07 MED ORDER — HALOPERIDOL DECANOATE 100 MG/ML IM SOLN
100.0000 mg | Freq: Once | INTRAMUSCULAR | Status: AC
  Administered 2023-02-14: 100 mg via INTRAMUSCULAR

## 2023-02-07 NOTE — Progress Notes (Signed)
Psychiatric Initial Adult Assessment   Patient Identification: David Blankenship MRN:  [id number] Date of Evaluation:  02/07/2023 Referral Source: [facility] Chief Complaint:   Chief Complaint  Patient presents with   New Patient (Initial Visit)   Visit Diagnosis:    ICD-10-CM   1. Marijuana dependence  F12.20     2. Schizophrenia, unspecified type  F20.9 haloperidol decanoate (HALDOL DECANOATE) 100 MG/ML injection 100 mg    haloperidol (HALDOL) 10 MG tablet    haloperidol decanoate (HALDOL DECANOATE) 100 MG/ML injection    divalproex (DEPAKOTE ER) 500 MG 24 hr tablet    DISCONTINUED: divalproex (DEPAKOTE) 500 MG DR tablet    DISCONTINUED: divalproex (DEPAKOTE ER) 500 MG 24 hr tablet      History of Present Illness: 37 year old male seen today for initial psychiatric evaluation.  He walked into the clinic for medication management.  He has a psychiatric history of ADHD, bipolar disorder, schizoaffective disorder, depression, and schizophrenia.  Patient informed Clinical research associate that currently he is taking Thorazine 100 mg 3 times daily, Haldol 10 mg nightly, and Depakote 500 mg twice daily.  Patient brought his bottles to confirm what he was taking.  Patient prescription bottles are from 2023.  Patient informed Clinical research associate that he feels stable on his current medication regimen but notes that he would like to receive his haldol injuection which he reports he last received at [facility] 2 months ago. Provider unable to locate this in the medical record    Today patient was well-groomed, pleasant, cooperative, engaged in conversation.  He informed Clinical research associate that he would like to receive his Haldol injection.  He notes that he last received it at [facility] 2 months ago.  Provider unable to locate this in the record.  Provider called [facility] as he was seen there on on 01/18/2023 for hallucinations.  Writer was informed that he did not receive an injection and was not discharged with medications.   Patient then informed writer that he was seen at [facility].  Provider called [facility] and was informed that patient has not been there in over 180 days.  Patient confused during exam but was accurately able to identify the current month, date, current location, and the current president.  Provider reviewed patient medicine bottles and he is taking prescriptions from October 2023.  Patient is uncertain who filled the prescriptions last (My Pharmacy on patient medicine bottles).  Patient reports that he is taking Thorazine 100 mg 3 times daily, Haldol 10 mg, and Depakote 500 mg three times daily.  Provider encouraged patient not to take old prescriptions as recommended tapering off of Thorazine.  He notes that he is not taking any of his medications in 2 days because he ran out of them.    Patient informed Clinical research associate that his mood is stable.  He denies symptoms of psychosis, paranoia, or mania.  Today provider conducted a GAD-7 and patient scored a 0.  Provider also conducted PHQ-9 the patient scored a 0.  He endorses adequate sleep and appetite.  Today he denies SI/HI.    Patient reports that he smokes marijuana daily.  He also notes that he smokes 2 packs of cigarettes a day.  Provider informed patient that marijuana can exacerbate his mental health.  He denies alcohol or other illegal drug use.  Provider conducted an aims assessment the patient scored a 0.  Patient agreeable to not restarting Thorazine.  Patient given oral Haldol 15 mg to be taken nightly.  He will follow-up  in 1 week to receive Haldol decanoate 100 mg injection.  Depakote 500 mg 3 times daily prescribed for bedtime. Potential side effects of medication and risks vs benefits of treatment vs non-treatment were explained and discussed. All questions were answered.  No other concerns at this time.   Associated Signs/Symptoms: Depression Symptoms:   Denies (Hypo) Manic Symptoms:   Denies Anxiety Symptoms:   Denies Psychotic Symptoms:    Denies PTSD Symptoms: Denies  Past Psychiatric History: ADHD, Bipolar 1, Depression, Schizophrenia  Previous Psychotropic Medications: Yes Thorazine, Depakote, Haldol,  Substance Abuse History in the last 12 months:  Yes.    Consequences of Substance Abuse: NA  Past Medical History:  Past Medical History:  Diagnosis Date   ADHD (attention deficit hyperactivity disorder)    Asthma    Bipolar 1 disorder    Depression    Schizophrenia    Seizures     Past Surgical History:  Procedure Laterality Date   COLONOSCOPY WITH ESOPHAGOGASTRODUODENOSCOPY (EGD)  10/28/2012   WUJ:WJXBJY tiny distal esophageal erosions consistent with mild erosive reflux esophagitis. Hiatal hernia. 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 no h.pylori   NO PAST SURGERIES     none      Family Psychiatric History: Denies Family History:  Family History  Problem Relation Age of Onset   Seizures Father    Asthma Other    Seizures Other    Cancer Other        aunt    Social History:   Social History   Socioeconomic History   Marital status: Single    Spouse name: Not on file   Number of children: Not on file   Years of education: Not on file   Highest education level: Not on file  Occupational History   Not on file  Tobacco Use   Smoking status: Every Day    Packs/day: 0.50    Years: 12.00    Additional pack years: 0.00    Total pack years: 6.00    Types: Cigarettes, Cigars   Smokeless tobacco: Never   Tobacco comments:    5 or 6 cigarettes daily  Vaping Use   Vaping Use: Never used  Substance and Sexual Activity   Alcohol use: Yes    Alcohol/week: 35.0 standard drinks of alcohol    Types: 35 Cans of beer per week   Drug use: Yes    Types: Marijuana   Sexual activity: Yes    Birth control/protection: Condom  Other Topics Concern   Not on  file  Social History Narrative   Not on file   Social Determinants of Health   Financial Resource Strain: Not on file  Food Insecurity: Not on file  Transportation Needs: Not on file  Physical Activity: Not on file  Stress: Not on file  Social Connections: Not on file    Additional Social History: Patient resides in [place] with his wife and 3 children.  He is currently unemployed and on disability.  He notes that he smokes marijuana daily.  He denies alcohol use.  He reports that he smokes 2 packs of cigarettes a day.  Allergies:   Allergies  Allergen Reactions   Acetaminophen Hives    Pt reports that he takes tylenol but it was previously reported as an allergy.   Bee Pollen Hives   Shellfish Allergy Hives    Metabolic Disorder Labs: No results found for: "  HGBA1C", "MPG" No results found for: "PROLACTIN" No results found for: "CHOL", "TRIG", "HDL", "CHOLHDL", "VLDL", "LDLCALC" Lab Results  Component Value Date   TSH 1.124 03/18/2014    Therapeutic Level Labs: No results found for: "LITHIUM" No results found for: "CBMZ" Lab Results  Component Value Date   VALPROATE <10 (L) 08/26/2021    Current Medications: Current Outpatient Medications  Medication Sig Dispense Refill   haloperidol (HALDOL) 10 MG tablet Take 1.5 tablets (15 mg total) by mouth at bedtime. 14 tablet 1   haloperidol decanoate (HALDOL DECANOATE) 100 MG/ML injection Inject 1 mL (100 mg total) into the muscle every 28 (twenty-eight) days. 1 mL 11   divalproex (DEPAKOTE ER) 500 MG 24 hr tablet Take 3 tablets (1,500 mg total) by mouth at bedtime. 90 tablet 3   Current Facility-Administered Medications  Medication Dose Route Frequency Provider Last Rate Last Admin   haloperidol decanoate (HALDOL DECANOATE) 100 MG/ML injection 100 mg  100 mg Intramuscular Once [name] E, NP        Musculoskeletal: Strength & Muscle Tone: within normal limits Gait & Station: normal Patient leans:  N/A  Psychiatric Specialty Exam: Review of Systems  Blood pressure 114/66, pulse (!) 105, height  (1.778 m), weight 170 lb (77.1 kg).Body mass index is 24.39 kg/m.  General Appearance: Well Groomed  Eye Contact:  Good  Speech:  Clear and Coherent and Normal Rate  Volume:  Normal  Mood:  Euthymic  Affect:  WDL and Logical  Thought Process:  Coherent, Goal Directed, and Linear  Orientation:  Full (Time, Place, and Person)  Thought Content:  WDL and Logical  Suicidal Thoughts:  No  Homicidal Thoughts:  No  Memory:  Immediate;   Good Recent;   Good Remote;   Good  Judgement:  Good  Insight:  Good  Psychomotor Activity:  Normal  Concentration:  Concentration: Good and Attention Span: Good  Recall:  Good  Fund of Knowledge:Good  Language: Good  Akathisia:  No  Handed:  Left  AIMS (if indicated):  done, 0  Assets:  Communication Skills Desire for Improvement Financial Resources/Insurance Housing Intimacy Leisure Time Physical Health Social Support  ADL's:  Intact  Cognition: WNL  Sleep:  Good   Screenings: AIMS    Flowsheet Row Office Visit from 02/07/2023 in [facility]  AIMS Total Score 0      GAD-7    Flowsheet Row Office Visit from 02/07/2023 in [facility]  Total GAD-7 Score 0      PHQ2-9    Flowsheet Row Office Visit from 02/07/2023 in [facility]  PHQ-2 Total Score 0  PHQ-9 Total Score 0      Flowsheet Row ED from 09/26/2022 in [facility] Emergency Department at [facility] ED from 04/07/2022 in [facility] Emergency Department at [facility] ED from 04/01/2022 in [facility] Emergency Department at [facility]  C-SSRS RISK CATEGORY No Risk No Risk No Risk       Assessment and Plan: Patient slightly confused during exam.  He does note that his anxiety and depression are well-managed.  He denies symptoms of psychosis, paranoia, or  mania.  Patient taking old prescriptions of medications.  Provider recommended patient taking medications as prescribed.Patient agreeable to not restarting Thorazine.  Patient given oral Haldol 15 mg to be taken nightly.  He will follow-up in 1 week to receive Haldol decanoate 100 mg injection.  Depakote 500  mg 3 times daily prescribed for bedtime.  1. Marijuana dependence   2. Schizophrenia, unspecified type  Start- haloperidol decanoate (HALDOL DECANOATE) 100 MG/ML injection 100 mg Continue for 7 days and then discontinue- haloperidol (HALDOL) 10 MG tablet; Take 1.5 tablets (15 mg total) by mouth at bedtime.  Dispense: 14 tablet; Refill: 1 Start- haloperidol decanoate (HALDOL DECANOATE) 100 MG/ML injection; Inject 1 mL (100 mg total) into the muscle every 28 (twenty-eight) days.  Dispense: 1 mL; Refill: 11 Continue- divalproex (DEPAKOTE ER) 500 MG 24 hr tablet; Take 3 tablets (1,500 mg total) by mouth at bedtime.  Dispense: 90 tablet; Refill: 3   Collaboration of Care: Other provider involved in patient's care AEB shot clinic staff  Patient/Guardian was advised Release of Information must be obtained prior to any record release in order to collaborate their care with an outside provider. Patient/Guardian was advised if they have not already done so to contact the registration department to sign all necessary forms in order for [place] to release information regarding their care.   Consent: Patient/Guardian gives verbal consent for treatment and assignment of benefits for services provided during this visit. Patient/Guardian expressed understanding and agreed to proceed.   Follow-up in 2.5 months for medication management Follow-up in 1 week with injection clinic Shanna Cisco, NP 4/18/202412:12 PM

## 2023-02-14 ENCOUNTER — Encounter (HOSPITAL_COMMUNITY): Payer: Self-pay

## 2023-02-14 ENCOUNTER — Ambulatory Visit (INDEPENDENT_AMBULATORY_CARE_PROVIDER_SITE_OTHER): Payer: Medicaid Other

## 2023-02-14 VITALS — BP 103/80 | HR 88 | Ht 70.0 in | Wt 181.0 lb

## 2023-02-14 DIAGNOSIS — G4700 Insomnia, unspecified: Secondary | ICD-10-CM | POA: Diagnosis not present

## 2023-02-14 DIAGNOSIS — F209 Schizophrenia, unspecified: Secondary | ICD-10-CM

## 2023-02-14 DIAGNOSIS — F1220 Cannabis dependence, uncomplicated: Secondary | ICD-10-CM

## 2023-02-14 DIAGNOSIS — F411 Generalized anxiety disorder: Secondary | ICD-10-CM

## 2023-02-14 NOTE — Progress Notes (Cosign Needed)
PATIENT PRESENTS TO THE OFFICE FOR HALOPERIDOL DECANOATE INJECTION 100 MG INJECTION WAS GIVEN BY Kester Stimpson IN RIGHT DELTOID WITH NO ISSUE OR COMPLAINT. PT WILL RETURN IN 28 DAYS

## 2023-03-14 ENCOUNTER — Encounter (HOSPITAL_COMMUNITY): Payer: Self-pay

## 2023-03-14 ENCOUNTER — Other Ambulatory Visit (HOSPITAL_COMMUNITY): Payer: Self-pay | Admitting: Psychiatry

## 2023-03-14 ENCOUNTER — Ambulatory Visit (INDEPENDENT_AMBULATORY_CARE_PROVIDER_SITE_OTHER): Payer: Medicaid Other

## 2023-03-14 VITALS — BP 142/71 | HR 71 | Ht 70.0 in | Wt 191.2 lb

## 2023-03-14 DIAGNOSIS — F200 Paranoid schizophrenia: Secondary | ICD-10-CM

## 2023-03-14 DIAGNOSIS — G479 Sleep disorder, unspecified: Secondary | ICD-10-CM

## 2023-03-14 DIAGNOSIS — F411 Generalized anxiety disorder: Secondary | ICD-10-CM

## 2023-03-14 DIAGNOSIS — G4700 Insomnia, unspecified: Secondary | ICD-10-CM

## 2023-03-14 MED ORDER — HALOPERIDOL DECANOATE 100 MG/ML IM SOLN
100.0000 mg | Freq: Once | INTRAMUSCULAR | Status: AC
  Administered 2023-03-14: 100 mg via INTRAMUSCULAR

## 2023-03-14 MED ORDER — TRAZODONE HCL 50 MG PO TABS: 50.0000 mg | ORAL_TABLET | Freq: Every day | ORAL | 3 refills | Status: DC

## 2023-03-14 NOTE — Progress Notes (Signed)
PATIENT PRESENTS TO THE OFFICE FOR HALOPERIDOL DECANOATE INJECTION 100 MG INJECTION WAS GIVEN BY Skarlette Lattner IN LEFT DELTOID WITH NO ISSUE OR COMPLAINT. PT WILL RETURN IN 28 DAYS

## 2023-03-14 NOTE — Telephone Encounter (Signed)
Patient came into shock clinic today and informed nursing staff that he was having issues sleeping.  Provider spoke with patient and his wife and was informed that he is sleeping on and off throughout the night.  Patient's wife notes that he tosses and turns and wakes up frequently.  Patient notes that he is sleeping approximately 3 to 5 hours nightly.  Patient notes that he has heard trazodone was effective and wanted to trial this medication.  Provider was agreeable to feeling trazodone 50 mg nightly.  Provider informed patient that if 50 mg is ineffective he can increase his dose to 100 mg.  He endorsed understanding and agreed.  No other concerns noted at this time.Potential side effects of medication and risks vs benefits of treatment vs non-treatment were explained and discussed. All questions were answered.

## 2023-04-09 ENCOUNTER — Other Ambulatory Visit (HOSPITAL_COMMUNITY): Payer: Self-pay | Admitting: Psychiatry

## 2023-04-09 DIAGNOSIS — F209 Schizophrenia, unspecified: Secondary | ICD-10-CM

## 2023-04-11 ENCOUNTER — Ambulatory Visit (INDEPENDENT_AMBULATORY_CARE_PROVIDER_SITE_OTHER): Payer: Medicaid Other | Admitting: *Deleted

## 2023-04-11 ENCOUNTER — Encounter (HOSPITAL_COMMUNITY): Payer: Self-pay

## 2023-04-11 VITALS — BP 104/63 | HR 79 | Resp 16 | Ht 70.0 in | Wt 199.4 lb

## 2023-04-11 DIAGNOSIS — F209 Schizophrenia, unspecified: Secondary | ICD-10-CM

## 2023-04-11 MED ORDER — HALOPERIDOL DECANOATE 100 MG/ML IM SOLN
100.0000 mg | Freq: Once | INTRAMUSCULAR | Status: AC
  Administered 2023-04-11: 100 mg via INTRAMUSCULAR

## 2023-04-11 NOTE — Progress Notes (Cosign Needed)
Patient arrived with his Haldol 100mg . Given in Right Deltoid without issue or complaint. Did states his arm was sore last injection, numbing spray was used for this injection. States he is scared of needles. Had his wife holding his hand for support. Pleasant cooperative, bright affect. Denies SI/HI.

## 2023-04-18 ENCOUNTER — Ambulatory Visit (HOSPITAL_COMMUNITY): Payer: Medicaid Other

## 2023-05-06 ENCOUNTER — Emergency Department (HOSPITAL_COMMUNITY)
Admission: EM | Admit: 2023-05-06 | Discharge: 2023-05-06 | Disposition: A | Discharge status: Home / Self Care | Payer: MEDICAID | Attending: Emergency Medicine | Admitting: Emergency Medicine

## 2023-05-06 ENCOUNTER — Other Ambulatory Visit: Payer: Self-pay

## 2023-05-06 ENCOUNTER — Encounter (HOSPITAL_COMMUNITY): Payer: Self-pay

## 2023-05-06 DIAGNOSIS — H9203 Otalgia, bilateral: Secondary | ICD-10-CM | POA: Diagnosis present

## 2023-05-06 DIAGNOSIS — H6123 Impacted cerumen, bilateral: Secondary | ICD-10-CM | POA: Diagnosis not present

## 2023-05-06 MED ORDER — CARBAMIDE PEROXIDE 6.5 % OT SOLN: 5.0000 [drp] | Freq: Two times a day (BID) | OTIC | 0 refills | Status: DC

## 2023-05-06 MED ORDER — HYDROGEN PEROXIDE 3 % EX SOLN
CUTANEOUS | Status: AC
  Filled 2023-05-06: qty 473

## 2023-05-06 NOTE — ED Triage Notes (Signed)
Patient has bilateral ear pain for 3 months. Still able to hear.

## 2023-05-06 NOTE — Discharge Instructions (Addendum)
You were seen in the ER for ear pain. Your ears had significant wax buildup in them. We cleaned your ears out here in the ER. I have a sent a prescription for Debrox to your pharmacy to use at home for continued management of wax buildup in the ears. If your symptoms return, you may follow up in the ER or urgent care.

## 2023-05-06 NOTE — ED Provider Notes (Signed)
EMERGENCY DEPARTMENT AT [facility] Provider Note   CSN: [id number] Arrival date & time: 05/06/23  1452     History Chief Complaint  Patient presents with   Otalgia    David Blankenship is a 37 y.o. male.  Patient presents to the emergency department complaints of bilateral ear pain.  Reports that been ongoing for the last 3 months or so.  Denies any hearing loss or any ear drainage.  No fevers, sore throat.  Was seen by primary care provider several months ago to a year ago who advised patient he needs to clean his ears more often due to amount of wax buildup in his ears.   Otalgia      Home Medications Prior to Admission medications   Medication Sig Start Date End Date Taking? Authorizing Provider  carbamide peroxide (DEBROX) 6.5 % OTIC solution Place 5 drops into both ears 2 (two) times daily. 05/06/23  Yes Smitty Knudsen, PA-C  divalproex (DEPAKOTE ER) 500 MG 24 hr tablet Take 3 tablets (1,500 mg total) by mouth at bedtime. 02/07/23   Shanna Cisco, NP  haloperidol (HALDOL) 10 MG tablet TAKE 1 AND 1/2 TABLETS(15 MG) BY MOUTH AT BEDTIME 04/09/23   [name] E, NP  haloperidol decanoate (HALDOL DECANOATE) 100 MG/ML injection Inject 1 mL (100 mg total) into the muscle every 28 (twenty-eight) days. 02/07/23   Shanna Cisco, NP  traZODone (DESYREL) 50 MG tablet Take 1-2 tablets (50-100 mg total) by mouth at bedtime. 03/14/23   Shanna Cisco, NP      Allergies    Acetaminophen, Bee pollen, and Shellfish allergy    Review of Systems   Review of Systems  HENT:  Positive for ear pain.   All other systems reviewed and are negative.   Physical Exam Updated Vital Signs BP 134/76   Pulse 75   Temp 97.8 F (36.6 C) (Oral)   Resp 18   Ht 5\' 10"  (1.778 m)   Wt 91 kg   SpO2 98%   BMI 28.79 kg/m  Physical Exam Vitals and nursing note reviewed.  Constitutional:      General: He is not in acute distress.    Appearance: He is  well-developed.  HENT:     Head: Normocephalic and atraumatic.     Right Ear: There is impacted cerumen.     Left Ear: There is impacted cerumen.  Eyes:     Conjunctiva/sclera: Conjunctivae normal.  Cardiovascular:     Rate and Rhythm: Normal rate and regular rhythm.     Heart sounds: No murmur heard. Pulmonary:     Effort: Pulmonary effort is normal. No respiratory distress.     Breath sounds: Normal breath sounds.  Abdominal:     Palpations: Abdomen is soft.     Tenderness: There is no abdominal tenderness.  Musculoskeletal:        General: No swelling.     Cervical back: Neck supple.  Skin:    General: Skin is warm and dry.     Capillary Refill: Capillary refill takes less than 2 seconds.  Neurological:     Mental Status: He is alert.  Psychiatric:        Mood and Affect: Mood normal.     ED Results / Procedures / Treatments   Labs (all labs ordered are listed, but only abnormal results are displayed) Labs Reviewed - No data to display  EKG None  Radiology No results found.  Procedures .  Ear Cerumen Removal  Date/Time: 05/06/2023 5:48 PM  Performed by: Smitty Knudsen, PA-C Authorized by: Smitty Knudsen, PA-C   Consent:    Consent obtained:  Verbal   Consent given by:  Patient   Risks discussed:  Bleeding, infection and dizziness   Alternatives discussed:  Alternative treatment Universal protocol:    Patient identity confirmed:  Verbally with patient Procedure details:    Location:  L ear and R ear   Procedure type: irrigation     Procedure outcomes: cerumen removed   Post-procedure details:    Inspection:  No bleeding and TM intact   Hearing quality:  Normal   Procedure completion:  Tolerated    Medications Ordered in ED Medications  hydrogen peroxide 3 % external solution (  Given 05/06/23 1649)    ED Course/ Medical Decision Making/ A&P                           Medical Decision Making Risk OTC drugs.   This patient presents to the ED  for concern of ear pain.  Differential diagnosis includes cerumen impaction, acute otitis media, otitis externa, mastoiditis   Problem List / ED Course:  Patient presents emergency department complaints of ear pain.  Reports has been ongoing for the last 3 months and was previously told by primary care provider that he has significant wax buildup in bilateral ears.  He has not been managing the wax buildup in his ears as recommended.  Examination today was significant wax buildup and bilateral ear canals.  Ears were irrigated and wax was removed.  No signs of acute otitis media patient reports significant symptomatic improvement with cerumen removal.  A prescription for acute proximal send to patient's pharmacy for use at home.  Advised patient to follow-up with primary care provider for further evaluation of symptoms.  Did also discuss strict return precautions and when to return for any worsening symptoms.  Patient is agreeable to treatment plan verbalized understanding all return precautions.  All questions answered prior to patient discharge.  Final Clinical Impression(s) / ED Diagnoses Final diagnoses:  Bilateral impacted cerumen    Rx / DC Orders ED Discharge Orders          Ordered    carbamide peroxide (DEBROX) 6.5 % OTIC solution  2 times daily        05/06/23 1745              Smitty Knudsen, PA-C 05/06/23 1749    Wynetta Fines, MD 05/06/23 2250

## 2023-05-06 NOTE — ED Notes (Signed)
Flushed ears with half hydrogen peroxide and half water mixture. Patient states no more pain and he can hear better.

## 2023-05-09 ENCOUNTER — Ambulatory Visit (HOSPITAL_COMMUNITY): Payer: Medicaid Other | Admitting: Student

## 2023-05-09 ENCOUNTER — Ambulatory Visit (HOSPITAL_COMMUNITY): Payer: Medicaid Other

## 2023-05-12 ENCOUNTER — Encounter (HOSPITAL_COMMUNITY): Payer: Self-pay

## 2023-05-12 ENCOUNTER — Emergency Department (HOSPITAL_COMMUNITY)
Admission: EM | Admit: 2023-05-12 | Discharge: 2023-05-12 | Disposition: A | Discharge status: Home / Self Care | Payer: MEDICAID | Attending: Emergency Medicine | Admitting: Emergency Medicine

## 2023-05-12 DIAGNOSIS — Z7689 Persons encountering health services in other specified circumstances: Secondary | ICD-10-CM | POA: Insufficient documentation

## 2023-05-12 NOTE — ED Provider Notes (Signed)
[place] EMERGENCY DEPARTMENT AT [facility] Provider Note   CSN: [id number] Arrival date & time: 05/12/23  1718     History  Chief Complaint  Patient presents with   Medication Assistance    David Blankenship is a 37 y.o. male.  HPI  Patient arrived in emergency department requesting Haldol administration.  His last administration of this medication was 31 days ago which is prescribed for every 28 days.  He has no other complaints.  He understands that we can administer this medication today however he will need to have this administered elsewhere in the future.    Home Medications Prior to Admission medications   Medication Sig Start Date End Date Taking? Authorizing Provider  carbamide peroxide (DEBROX) 6.5 % OTIC solution Place 5 drops into both ears 2 (two) times daily. 05/06/23   Smitty Knudsen, PA-C  divalproex (DEPAKOTE ER) 500 MG 24 hr tablet Take 3 tablets (1,500 mg total) by mouth at bedtime. 02/07/23   Shanna Cisco, NP  haloperidol (HALDOL) 10 MG tablet TAKE 1 AND 1/2 TABLETS(15 MG) BY MOUTH AT BEDTIME 04/09/23   [name] E, NP  haloperidol decanoate (HALDOL DECANOATE) 100 MG/ML injection Inject 1 mL (100 mg total) into the muscle every 28 (twenty-eight) days. 02/07/23   Shanna Cisco, NP  traZODone (DESYREL) 50 MG tablet Take 1-2 tablets (50-100 mg total) by mouth at bedtime. 03/14/23   Shanna Cisco, NP      Allergies    Acetaminophen, Bee pollen, and Shellfish allergy    Review of Systems   Review of Systems  Physical Exam Updated Vital Signs BP 112/87 (BP Location: Left Arm)   Pulse 80   Temp 98.1 F (36.7 C) (Oral)   Resp 18   SpO2 99%  Physical Exam Vitals and nursing note reviewed.  Constitutional:      General: He is not in acute distress.    Appearance: Normal appearance. He is not ill-appearing.  HENT:     Head: Normocephalic and atraumatic.  Eyes:     General: No scleral icterus.       Right eye: No  discharge.        Left eye: No discharge.     Conjunctiva/sclera: Conjunctivae normal.  Pulmonary:     Effort: Pulmonary effort is normal.     Breath sounds: No stridor.  Neurological:     Mental Status: He is alert and oriented to person, place, and time. Mental status is at baseline.  Psychiatric:        Mood and Affect: Mood normal.        Behavior: Behavior normal.     ED Results / Procedures / Treatments   Labs (all labs ordered are listed, but only abnormal results are displayed) Labs Reviewed - No data to display  EKG None  Radiology No results found.  Procedures Procedures    Medications Ordered in ED Medications - No data to display  ED Course/ Medical Decision Making/ A&P                             Medical Decision Making  Patient arrived in emergency department requesting Haldol administration.  His last administration of this medication was 31 days ago which is prescribed for every 28 days.  He has no other complaints.  He understands that we can administer this medication today however he will need to have this administered elsewhere  in the future.  Physical exam unremarkable pleasant with normal vital signs  Patient's wife is with him  He was last given this medication at [facility].  Final Clinical Impression(s) / ED Diagnoses Final diagnoses:  Encounter for medication administration    Rx / DC Orders ED Discharge Orders     None         Gailen Shelter, [place] 05/12/23 1953    Lonell Grandchild, MD 05/12/23 2217

## 2023-05-12 NOTE — Discharge Instructions (Addendum)
Please follow-up at [facility] or at primary care office

## 2023-05-12 NOTE — ED Triage Notes (Signed)
Pt arrived via POV, requesting haldol injection. Has medication with him.

## 2023-06-13 ENCOUNTER — Encounter (HOSPITAL_COMMUNITY): Payer: Self-pay | Admitting: Student

## 2023-06-13 ENCOUNTER — Ambulatory Visit (INDEPENDENT_AMBULATORY_CARE_PROVIDER_SITE_OTHER): Payer: Medicaid Other | Admitting: Student

## 2023-06-13 DIAGNOSIS — F250 Schizoaffective disorder, bipolar type: Secondary | ICD-10-CM

## 2023-06-13 DIAGNOSIS — F209 Schizophrenia, unspecified: Secondary | ICD-10-CM | POA: Diagnosis not present

## 2023-06-13 DIAGNOSIS — G479 Sleep disorder, unspecified: Secondary | ICD-10-CM

## 2023-06-13 MED ORDER — TRAZODONE HCL 50 MG PO TABS: 150.0000 mg | ORAL_TABLET | Freq: Every day | ORAL | 0 refills | Status: DC

## 2023-06-13 MED ORDER — DIVALPROEX SODIUM ER 500 MG PO TB24: 1500.0000 mg | ORAL_TABLET | Freq: Every day | ORAL | 0 refills | Status: DC

## 2023-06-13 MED ORDER — BUSPIRONE HCL 10 MG PO TABS: 10.0000 mg | ORAL_TABLET | Freq: Two times a day (BID) | ORAL | 0 refills | Status: AC

## 2023-06-13 NOTE — Progress Notes (Signed)
BH MD Outpatient Progress Note  06/13/2023 4:17 PM David Blankenship  MRN:  [id number]  Assessment:  David Blankenship presents for follow-up evaluation in-person. Today, 06/13/23, patient reports presenting as walk-in due to missing his last appointment and needing refills of medications. He has hx of schizophrenia, cannabis abuse, alcohol abuse, malingering. He is on haldol LAI and was on a 14 day oral bridge. He last received his LAI in the ED on 05/12/23. He plans to pick up prescription and bring to shot clinic next Tuesday. Patient is also due for labs related to being on depakote. It appears he is regularly subtherapeutic based on chart review due to not filling medications and missing a few days. He had previously gone to the ED frequently but appears to be less frequent than in 2023 since starting haldol LAI.  Identifying Information: David Blankenship is a 37 y.o. male with a history of cannabis use disorder, nicotine use disorder, schizophrenia vs schizoaffective disorder, IBS, GERD, malingering who is an established patient with [facility] for medication management.   Plan:  # Schizophrenia vs Schizoaffective Disorder Past medication trials:  Status of problem: stable Interventions: -- continue haloperidol 100 mg LAI -- continue depakote ER 1500 mg at bedtime  -- INCREASE Trazodone to 150 mg at bedtime  -- START buspar 10 mg bid  # Nicotine use disorder # Cannabis use disorder # Hx of alcohol abuse Past medication trials:  Status of problem: active Interventions: -- advise cessation of all substance use   Return to care in 4 weeks. Plan to go to shot clinic next Tuesday and get blood draw next Wednesday   Patient was given contact information for behavioral health clinic and was instructed to call 911 for emergencies.    Patient and plan of care will be discussed with the Attending MD, Dr. Josephina Blankenship, who agrees with the above statement and  plan.   Subjective:  Chief Complaint: Medication Management   Interval History:  Patient presents with wife as a walk-in stating he would like medication refills. Patient ran out of medications approximately 3 days ago. He states he went to the ED last month to get his haloperidol LAI. He states his mood has been stable on depakote 1500 mg at bedtime, trazodone 50-100 mg, and haloperidol decanoate. He reports not having psychosis recently and states he has been getting the haloperidol injection once a month since April of this year. He does report some generalized anxiety for which he was amenable to starting buspirone. I explained that he would need to fill his prescription of haloperidol LAI and bring to shot clinic next Tuesday. He will also need to return Wednesday as he had not had labs for depakote in some time now. He reports sleep has been fair but reports not sleeping sufficiently. He reports problem is waking up very early and being unable to go back to bed. He reports appetite has been fair. Denies SI/HI/AVH. Reports mood has been "good". Wife voices similar concerns as patient.   Patient admits to smoking 1 ppd of [place] and 1 blunt per day of marijuana. Reports it aids him with anxiety. He denies alcohol use or illicit substance use.  Visit Diagnosis:    ICD-10-CM   1. Schizophrenia, unspecified type (HCC)  F20.9 divalproex (DEPAKOTE ER) 500 MG 24 hr tablet    CBC w/Diff/Platelet    Comprehensive Metabolic Panel (CMET)    HgB A1c    Lipid Profile    TSH  VITAMIN D 25 Hydroxy (Vit-D Deficiency, Fractures)    CANCELED: CBC w/Diff/Platelet    CANCELED: Comprehensive Metabolic Panel (CMET)    CANCELED: HgB A1c    CANCELED: Lipid Profile    CANCELED: TSH    CANCELED: VITAMIN D 25 Hydroxy (Vit-D Deficiency, Fractures)    2. Schizoaffective disorder, bipolar type (HCC)  F25.0 busPIRone (BUSPAR) 10 MG tablet    3. Sleep disturbance  G47.9 traZODone (DESYREL) 50 MG tablet       Past Psychiatric History:  Diagnoses: Schizophrenia, Substance induced mood disorder, substance induced psychosis,  Previous psychiatrist/therapist: Toy Blankenship Hospitalizations: 1 per chart review Suicide attempts: denies SIB: denies Hx of violence towards others: denies Current access to guns: denies Hx of trauma/abuse: denies Substance use: cannabis and nicotine  Past Medical History:  Past Medical History:  Diagnosis Date   ADHD (attention deficit hyperactivity disorder)    Asthma    Bipolar 1 disorder (HCC)    Depression    Schizophrenia (HCC)    Seizures (HCC)     Past Surgical History:  Procedure Laterality Date   COLONOSCOPY WITH ESOPHAGOGASTRODUODENOSCOPY (EGD)  10/28/2012   BJY:NWGNFA tiny distal esophageal erosions consistent with mild erosive reflux esophagitis. Hiatal hernia. 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 no h.pylori   NO PAST SURGERIES     none       Family History:  Family History  Problem Relation Age of Onset   Seizures Father    Asthma Other    Seizures Other    Cancer Other        aunt    Social History:   Social History   Socioeconomic History   Marital status: Significant Other    Spouse name: Not on file   Number of children: Not on file   Years of education: Not on file   Highest education level: Not on file  Occupational History   Not on file  Tobacco Use   Smoking status: Every Day    Current packs/day: 0.50    Average packs/day: 0.5 packs/day for 12.0 years (6.0 ttl pk-yrs)    Types: Cigarettes, Cigars   Smokeless tobacco: Never   Tobacco comments:    5 or 6 cigarettes daily  Vaping Use   Vaping status: Never Used  Substance and Sexual Activity   Alcohol use: Yes    Alcohol/week: 35.0 standard drinks of alcohol    Types: 35 Cans of beer per week   Drug use: Yes     Types: Marijuana   Sexual activity: Yes    Birth control/protection: Condom  Other Topics Concern   Not on file  Social History Narrative   Not on file   Social Determinants of Health   Financial Resource Strain: Unknown (09/27/2021)   Received from [facility]   Overall Financial Resource Strain (CARDIA)    Difficulty of Paying Living Expenses: Patient declined  Food Insecurity: Not on file  Transportation Needs: Not on file  Physical Activity: Not on file  Stress: Unknown (09/27/2021)   Received from [facility] - Occupational Stress Questionnaire    Feeling of Stress : Patient declined  Social Connections: Unknown (02/20/2022)   Received from [facility]   Social Network    Social Network: Not on file    Allergies:  Allergies  Allergen Reactions   Acetaminophen Hives    Pt reports that  he takes tylenol but it was previously reported as an allergy.   Bee Pollen Hives   Shellfish Allergy Hives    Current Medications: Current Outpatient Medications  Medication Sig Dispense Refill   busPIRone (BUSPAR) 10 MG tablet Take 1 tablet (10 mg total) by mouth 2 (two) times daily. 60 tablet 0   carbamide peroxide (DEBROX) 6.5 % OTIC solution Place 5 drops into both ears 2 (two) times daily. 15 mL 0   divalproex (DEPAKOTE ER) 500 MG 24 hr tablet Take 3 tablets (1,500 mg total) by mouth at bedtime. 90 tablet 0   haloperidol decanoate (HALDOL DECANOATE) 100 MG/ML injection Inject 1 mL (100 mg total) into the muscle every 28 (twenty-eight) days. 1 mL 11   traZODone (DESYREL) 50 MG tablet Take 3 tablets (150 mg total) by mouth at bedtime. 90 tablet 0   No current facility-administered medications for this visit.    ROS: Review of Systems   Objective:  Psychiatric Specialty Exam: There were no vitals taken for this visit.There is no height or weight on file to calculate BMI.  Blankenship Appearance: Disheveled and Guarded  Eye Contact:  Fair  Speech:  Clear and Coherent and Normal Rate  Volume:  Normal  Mood:  Euthymic  Affect:  Appropriate and Congruent  Thought Content: Logical   Suicidal Thoughts:  No  Homicidal Thoughts:  No  Thought Process:  Coherent and Goal Directed occasionally circumstantial  Orientation:  Full (Time, Place, and Person)    Memory: Remote;   Fair  Judgment:  Intact  Insight:  Fair  Concentration:  Concentration: Fair  Recall: not formally assessed   Fund of Knowledge: Fair  Language: Fair  Psychomotor Activity:  Normal  Akathisia:  No  AIMS (if indicated): done, 0  Assets:  Communication Skills Desire for Improvement Financial Resources/Insurance Housing Intimacy Leisure Time Physical Health Resilience Social Support Talents/Skills  ADL's:  Intact  Cognition: WNL  Sleep:  Fair   PE: Blankenship: well-appearing; no acute distress  Pulm: no increased work of breathing on room air  Strength & Muscle Tone: within normal limits Neuro: no focal neurological deficits observed  Gait & Station: normal  Metabolic Disorder Labs: No results found for: "HGBA1C", "MPG" No results found for: "PROLACTIN" No results found for: "CHOL", "TRIG", "HDL", "CHOLHDL", "VLDL", "LDLCALC" Lab Results  Component Value Date   TSH 1.124 03/18/2014   TSH 6.017 (H) 11/26/2012    Therapeutic Level Labs: No results found for: "LITHIUM" Lab Results  Component Value Date   VALPROATE <10 (L) 08/26/2021   VALPROATE <10 (L) 01/21/2021   No results found for: "CBMZ"  Screenings: AIMS    Flowsheet Row Office Visit from 02/07/2023 in [facility]  AIMS Total Score 0      GAD-7    Flowsheet Row Office Visit from 02/07/2023 in [facility]  Total GAD-7 Score 0      PHQ2-9    Flowsheet Row Office Visit from 02/07/2023 in [facility]  PHQ-2 Total Score 0  PHQ-9 Total Score 0      Flowsheet Row ED from  05/12/2023 in [facility] Emergency Department at [facility] ED from 05[facility]06/2023 in [facility] Emergency Department at [facility] ED from 04/07/2022 in [facility] Emergency Department at [facility]  C-SSRS RISK CATEGORY No Risk No Risk No Risk       Collaboration of Care: Collaboration of Care:  Patient/Guardian was advised  Release of Information must be obtained prior to any record release in order to collaborate their care with an outside provider. Patient/Guardian was advised if they have not already done so to contact the registration department to sign all necessary forms in order for [place] to release information regarding their care.   Consent: Patient/Guardian gives verbal consent for treatment and assignment of benefits for services provided during this visit. Patient/Guardian expressed understanding and agreed to proceed.   Park Pope, MD 06/13/2023, 4:17 PM

## 2023-06-19 ENCOUNTER — Other Ambulatory Visit (HOSPITAL_COMMUNITY): Payer: MEDICAID

## 2023-06-26 ENCOUNTER — Other Ambulatory Visit (HOSPITAL_COMMUNITY): Payer: Medicaid Other

## 2023-07-18 ENCOUNTER — Encounter (HOSPITAL_COMMUNITY): Payer: Medicaid Other | Admitting: Student

## 2023-08-06 ENCOUNTER — Encounter (HOSPITAL_COMMUNITY): Payer: Self-pay

## 2023-08-06 ENCOUNTER — Other Ambulatory Visit: Payer: Self-pay

## 2023-08-06 ENCOUNTER — Emergency Department (HOSPITAL_COMMUNITY)
Admission: EM | Admit: 2023-08-06 | Discharge: 2023-08-06 | Discharge status: Against Medical Advice | Payer: MEDICAID | Attending: Emergency Medicine | Admitting: Emergency Medicine

## 2023-08-06 DIAGNOSIS — R561 Post traumatic seizures: Secondary | ICD-10-CM | POA: Diagnosis present

## 2023-08-06 DIAGNOSIS — Z5321 Procedure and treatment not carried out due to patient leaving prior to being seen by health care provider: Secondary | ICD-10-CM | POA: Diagnosis not present

## 2023-08-06 NOTE — ED Notes (Signed)
Pt stated he felt better and was going home, pt ambulated out of lobby without difficulty.

## 2023-08-08 ENCOUNTER — Ambulatory Visit (INDEPENDENT_AMBULATORY_CARE_PROVIDER_SITE_OTHER): Payer: MEDICAID | Admitting: Student

## 2023-08-08 DIAGNOSIS — F209 Schizophrenia, unspecified: Secondary | ICD-10-CM

## 2023-08-08 DIAGNOSIS — G479 Sleep disorder, unspecified: Secondary | ICD-10-CM | POA: Diagnosis not present

## 2023-08-08 DIAGNOSIS — F411 Generalized anxiety disorder: Secondary | ICD-10-CM | POA: Diagnosis not present

## 2023-08-08 MED ORDER — TRAZODONE HCL 50 MG PO TABS
150.0000 mg | ORAL_TABLET | Freq: Every day | ORAL | 1 refills | Status: DC
Start: 2023-08-08 — End: 2024-02-20

## 2023-08-08 MED ORDER — HALOPERIDOL 10 MG PO TABS
10.0000 mg | ORAL_TABLET | Freq: Every day | ORAL | 1 refills | Status: DC
Start: 2023-08-08 — End: 2024-02-20

## 2023-08-08 MED ORDER — BUSPIRONE HCL 10 MG PO TABS: 10.0000 mg | ORAL_TABLET | Freq: Two times a day (BID) | ORAL | 1 refills | Status: AC

## 2023-08-08 NOTE — Progress Notes (Signed)
BH MD Outpatient Progress Note  08/08/2023 3:26 PM David Blankenship  MRN:  [id number]  Assessment:  David Blankenship presents for follow-up evaluation in-person. Today, 08/08/23, patient reports presenting for medication management follow up. He reports being without medications for ~1 month.  Given inconsistency with medication compliance, we plan to restart haloperidol, buspirone, and trazodone.  Patient is not a good candidate for Depakote given inconsistency with showing up to appointments and ability to regularly obtain serum levels.  Will further explore patient's diagnosis of schizophrenia as he reports continued stability for approximately 1 month without any medications.  He denies substance use this visit despite admitting this last visit so it is unclear whether he is currently actively taking substances or not.  Identifying Information: David Blankenship is a 37 y.o. male with a history of cannabis use disorder, nicotine use disorder, schizophrenia vs schizoaffective disorder, IBS, GERD, malingering who is an established patient with [facility] for medication management.   Plan:  # Schizophrenia vs Schizoaffective Disorder Past medication trials:  Status of problem: stable Interventions: -- Restart haloperidol 10 mg daily -- Stop Depakote --Continue trazodone to 150 mg at bedtime  -- Continue buspar 10 mg bid  # Nicotine use disorder # Cannabis use disorder # Hx of alcohol abuse Past medication trials:  Interventions: -- advise cessation of all substance use   Return to care in 4 weeks. Plan to go to shot clinic next Tuesday and get blood draw next Wednesday   Patient was given contact information for behavioral health clinic and was instructed to call 911 for emergencies.    Patient and plan of care will be discussed with the Attending MD, Dr. Josephina Blankenship, who agrees with the above statement and plan.   Subjective:  Chief Complaint:  Medication Management   Interval History:  Patient seen and assessed for follow-up.  He reports not having any of his medications for the past month besides buspirone.  He denies SI/HI/AVH.  He reports he is not using any substances at this time.  He reports sleep and appetite have been good.  He reports wanting to be back on his psychotropic medications due to them helping with his irritability.  He was amenable to restarting buspirone, trazodone, and haloperidol.  He had no acute concerns today.  Visit Diagnosis:    ICD-10-CM   1. Generalized anxiety disorder  F41.1     2. Sleep disturbance  G47.9 traZODone (DESYREL) 50 MG tablet    3. Schizophrenia, unspecified type (HCC)  F20.9 busPIRone (BUSPAR) 10 MG tablet    haloperidol (HALDOL) 10 MG tablet      Past Psychiatric History:  Diagnoses: Schizophrenia, Substance induced mood disorder, substance induced psychosis,  Previous psychiatrist/therapist: Toy Blankenship Hospitalizations: 1 per chart review Suicide attempts: denies SIB: denies Hx of violence towards others: denies Current access to guns: denies Hx of trauma/abuse: denies Substance use: cannabis and nicotine  Past Medical History:  Past Medical History:  Diagnosis Date   ADHD (attention deficit hyperactivity disorder)    Asthma    Bipolar 1 disorder (HCC)    Depression    Schizophrenia (HCC)    Seizures (HCC)     Past Surgical History:  Procedure Laterality Date   COLONOSCOPY WITH ESOPHAGOGASTRODUODENOSCOPY (EGD)  10/28/2012   MV[facility]:QIONGE tiny distal esophageal erosions consistent with mild erosive reflux esophagitis. Hiatal hernia. 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 no  h.pylori   NO PAST SURGERIES     none       Family History:  Family History  Problem Relation Age of Onset   Seizures Father    Asthma Other     Seizures Other    Cancer Other        aunt    Social History:   Social History   Socioeconomic History   Marital status: Significant Other    Spouse name: Not on file   Number of children: Not on file   Years of education: Not on file   Highest education level: Not on file  Occupational History   Not on file  Tobacco Use   Smoking status: Every Day    Current packs/day: 0.50    Average packs/day: 0.5 packs/day for 12.0 years (6.0 ttl pk-yrs)    Types: Cigarettes, Cigars   Smokeless tobacco: Never   Tobacco comments:    5 or 6 cigarettes daily  Vaping Use   Vaping status: Never Used  Substance and Sexual Activity   Alcohol use: Yes    Alcohol/week: 35.0 standard drinks of alcohol    Types: 35 Cans of beer per week   Drug use: Yes    Types: Marijuana   Sexual activity: Yes    Birth control/protection: Condom  Other Topics Concern   Not on file  Social History Narrative   Not on file   Social Determinants of Health   Financial Resource Strain: Unknown (09/27/2021)   Received from [employer]   Overall Financial Resource Strain (CARDIA)    Difficulty of Paying Living Expenses: Patient declined  Food Insecurity: Not on file  Transportation Needs: Not on file  Physical Activity: Not on file  Stress: Unknown (09/27/2021)   Received from [facility], [facility]   [employer] [facility] Occupational [facility] - Occupational Stress Questionnaire    Feeling of Stress : Patient declined  Social Connections: Unknown (02/20/2022)   Received from [facility]   Social Network    Social Network: Not on file    Allergies:  Allergies  Allergen Reactions   Acetaminophen Hives    Pt reports that he takes tylenol but it was previously reported as an allergy.   Bee Pollen Hives   Shellfish Allergy Hives    Current Medications: Current Outpatient Medications  Medication Sig Dispense Refill   busPIRone (BUSPAR) 10 MG tablet Take 1 tablet  (10 mg total) by mouth 2 (two) times daily. 60 tablet 1   haloperidol (HALDOL) 10 MG tablet Take 1 tablet (10 mg total) by mouth daily. 30 tablet 1   haloperidol decanoate (HALDOL DECANOATE) 100 MG/ML injection Inject 1 mL (100 mg total) into the muscle every 28 (twenty-eight) days. 1 mL 11   traZODone (DESYREL) 50 MG tablet Take 3 tablets (150 mg total) by mouth at bedtime. 90 tablet 1   No current facility-administered medications for this visit.    ROS: Review of Systems   Objective:  Psychiatric Specialty Exam: There were no vitals taken for this visit.There is no height or weight on file to calculate BMI.  Blankenship Appearance: Disheveled and Guarded  Eye Contact: Fair  Speech:  Clear and Coherent and Normal Rate  Volume:  Normal  Mood:  Euthymic  Affect:  Appropriate and Congruent  Thought Content: Logical   Suicidal Thoughts:  No  Homicidal Thoughts:  No  Thought Process:  Coherent and Goal Directed occasionally circumstantial  Orientation:  Full (Time,  Place, and Person)    Memory: Remote;   Fair  Judgment:  Intact  Insight:  Fair  Concentration:  Concentration: Fair  Recall: not formally assessed   Fund of Knowledge: Fair  Language: Fair  Psychomotor Activity:  Normal  Akathisia:  No  AIMS (if indicated): done, 0  Assets:  Communication Skills Desire for Improvement Financial Resources/Insurance Housing Intimacy Leisure Time Physical Health Resilience Social Support Talents/Skills  ADL's:  Intact  Cognition: WNL  Sleep:  Fair   PE: Blankenship: well-appearing; no acute distress  Pulm: no increased work of breathing on room air  Strength & Muscle Tone: within normal limits Neuro: no focal neurological deficits observed  Gait & Station: normal  Metabolic Disorder Labs: No results found for: "HGBA1C", "MPG" No results found for: "PROLACTIN" No results found for: "CHOL", "TRIG", "HDL", "CHOLHDL", "VLDL", "LDLCALC" Lab Results  Component Value Date   TSH  1.124 03/18/2014   TSH 6.017 (H) 11/26/2012    Therapeutic Level Labs: No results found for: "LITHIUM" Lab Results  Component Value Date   VALPROATE <10 (L) 08/26/2021   VALPROATE <10 (L) 01/21/2021   No results found for: "CBMZ"  Screenings: AIMS    Flowsheet Row Office Visit from 02/07/2023 in [facility]  AIMS Total Score 0      GAD-7    Flowsheet Row Office Visit from 02/07/2023 in [facility]  Total GAD-7 Score 0      PHQ2-9    Flowsheet Row Office Visit from 02/07/2023 in [facility]  PHQ-2 Total Score 0  PHQ-9 Total Score 0      Flowsheet Row ED from 08/06/2023 in [facility] Emergency Department at [facility] ED from 05/12/2023 in [facility] Emergency Department at [facility] ED from 05/06/2023 in [facility] Emergency Department at [facility]  C-SSRS RISK CATEGORY No Risk No Risk No Risk       Collaboration of Care: Collaboration of Care:  Patient/Guardian was advised Release of Information must be obtained prior to any record release in order to collaborate their care with an outside provider. Patient/Guardian was advised if they have not already done so to contact the registration department to sign all necessary forms in order for [place] to release information regarding their care.   Consent: Patient/Guardian gives verbal consent for treatment and assignment of benefits for services provided during this visit. Patient/Guardian expressed understanding and agreed to proceed.   Park Pope, MD 08/08/2023, 3:26 PM

## 2023-10-06 ENCOUNTER — Emergency Department (HOSPITAL_COMMUNITY)
Admission: EM | Admit: 2023-10-06 | Discharge: 2023-10-07 | Disposition: A | Discharge status: Home / Self Care | Payer: MEDICAID | Attending: Emergency Medicine | Admitting: Emergency Medicine

## 2023-10-06 DIAGNOSIS — R109 Unspecified abdominal pain: Secondary | ICD-10-CM | POA: Diagnosis present

## 2023-10-06 DIAGNOSIS — R1010 Upper abdominal pain, unspecified: Secondary | ICD-10-CM | POA: Insufficient documentation

## 2023-10-07 ENCOUNTER — Encounter (HOSPITAL_COMMUNITY): Payer: Self-pay | Admitting: Emergency Medicine

## 2023-10-07 LAB — URINALYSIS, ROUTINE W REFLEX MICROSCOPIC
Bilirubin Urine: NEGATIVE
Glucose, UA: NEGATIVE mg/dL
Hgb urine dipstick: NEGATIVE
Ketones, ur: NEGATIVE mg/dL
Leukocytes,Ua: NEGATIVE
Nitrite: NEGATIVE
Protein, ur: NEGATIVE mg/dL
Specific Gravity, Urine: 1.011 (ref 1.005–1.030)
pH: 6 (ref 5.0–8.0)

## 2023-10-07 LAB — COMPREHENSIVE METABOLIC PANEL
ALT: 12 U/L (ref 0–44)
AST: 17 U/L (ref 15–41)
Albumin: 3.8 g/dL (ref 3.5–5.0)
Alkaline Phosphatase: 41 U/L (ref 38–126)
Anion gap: 6 (ref 5–15)
BUN: 13 mg/dL (ref 6–20)
CO2: 24 mmol/L (ref 22–32)
Calcium: 8.9 mg/dL (ref 8.9–10.3)
Chloride: 106 mmol/L (ref 98–111)
Creatinine, Ser: 0.84 mg/dL (ref 0.61–1.24)
GFR, Estimated: >60 mL/min (ref >60)
Glucose, Bld: 102 mg/dL — ABNORMAL HIGH (ref 70–99)
Potassium: 3.3 mmol/L — ABNORMAL LOW (ref 3.5–5.1)
Sodium: 136 mmol/L (ref 135–145)
Total Bilirubin: 0.6 mg/dL (ref <1.2)
Total Protein: 6.5 g/dL (ref 6.5–8.1)

## 2023-10-07 LAB — CBC WITH DIFFERENTIAL/PLATELET
Abs Immature Granulocytes: 0.03 10*3/uL (ref 0.00–0.07)
Basophils Absolute: 0 10*3/uL (ref 0.0–0.1)
Basophils Relative: 0 %
Eosinophils Absolute: 0.1 10*3/uL (ref 0.0–0.5)
Eosinophils Relative: 2 %
HCT: 40.3 % (ref 39.0–52.0)
Hemoglobin: 13.2 g/dL (ref 13.0–17.0)
Immature Granulocytes: 0 %
Lymphocytes Relative: 28 %
Lymphs Abs: 2.1 10*3/uL (ref 0.7–4.0)
MCH: 29.6 pg (ref 26.0–34.0)
MCHC: 32.8 g/dL (ref 30.0–36.0)
MCV: 90.4 fL (ref 80.0–100.0)
Monocytes Absolute: 0.5 10*3/uL (ref 0.1–1.0)
Monocytes Relative: 7 %
Neutro Abs: 4.6 10*3/uL (ref 1.7–7.7)
Neutrophils Relative %: 63 %
Platelets: 225 10*3/uL (ref 150–400)
RBC: 4.46 MIL/uL (ref 4.22–5.81)
RDW: 13 % (ref 11.5–15.5)
WBC: 7.4 10*3/uL (ref 4.0–10.5)
nRBC: 0 % (ref 0.0–0.2)

## 2023-10-07 MED ORDER — DROPERIDOL 2.5 MG/ML IJ SOLN
1.2500 mg | Freq: Once | INTRAMUSCULAR | Status: AC
  Administered 2023-10-07: 1.25 mg via INTRAVENOUS
  Filled 2023-10-07: qty 2

## 2023-10-07 NOTE — ED Provider Notes (Signed)
[place] EMERGENCY DEPARTMENT AT [facility] Provider Note   CSN: [id number] Arrival date & time: 10/06/23  2340     History  Chief Complaint  Patient presents with   Abdominal Pain    David Blankenship is a 37 y.o. male.  The history is provided by the patient.  Abdominal Pain Pain location:  Generalized Pain quality: aching   Pain radiates to:  Does not radiate Pain severity:  Moderate Onset quality:  Gradual Duration:  2 weeks Timing:  Constant Progression:  Waxing and waning Context: not alcohol use   Relieved by:  Nothing Worsened by:  Nothing Ineffective treatments:  None tried Associated symptoms: no anorexia, no constipation, no diarrhea, no fever, no nausea, no shortness of breath and no vomiting   Risk factors: no alcohol abuse        Home Medications Prior to Admission medications   Medication Sig Start Date End Date Taking? Authorizing Provider  busPIRone (BUSPAR) 10 MG tablet Take 1 tablet (10 mg total) by mouth 2 (two) times daily. 08/08/23 10/07/23 Yes Park Pope, MD  divalproex (DEPAKOTE ER) 500 MG 24 hr tablet Take 1,000 mg by mouth daily. 04/01/22  Yes [provider]  haloperidol (HALDOL) 10 MG tablet Take 1 tablet (10 mg total) by mouth daily. 08/08/23 10/07/23 Yes Park Pope, MD  haloperidol decanoate (HALDOL DECANOATE) 100 MG/ML injection Inject 1 mL (100 mg total) into the muscle every 28 (twenty-eight) days. 02/07/23  Yes [name] E, NP  traZODone (DESYREL) 50 MG tablet Take 3 tablets (150 mg total) by mouth at bedtime. 08/08/23 10/07/23 Yes Park Pope, MD      Allergies    Acetaminophen, Bee pollen, and Shellfish allergy    Review of Systems   Review of Systems  Constitutional:  Negative for fever.  Respiratory:  Negative for shortness of breath, wheezing and stridor.   Gastrointestinal:  Positive for abdominal pain. Negative for anorexia, constipation, diarrhea, nausea and vomiting.  All other systems  reviewed and are negative.   Physical Exam Updated Vital Signs BP (!) 126/115   Pulse 68   Temp 97.9 F (36.6 C) (Oral)   Resp 16   SpO2 100%  Physical Exam Vitals and nursing note reviewed.  Constitutional:      General: He is not in acute distress.    Appearance: Normal appearance. He is well-developed. He is not diaphoretic.  HENT:     Head: Normocephalic and atraumatic.     Nose: Nose normal.  Eyes:     Conjunctiva/sclera: Conjunctivae normal.     Pupils: Pupils are equal, round, and reactive to light.  Cardiovascular:     Rate and Rhythm: Normal rate and regular rhythm.     Pulses: Normal pulses.     Heart sounds: Normal heart sounds.  Pulmonary:     Effort: Pulmonary effort is normal.     Breath sounds: Normal breath sounds. No wheezing or rales.  Abdominal:     General: Bowel sounds are normal.     Palpations: Abdomen is soft.     Tenderness: There is no abdominal tenderness. There is no guarding or rebound.  Musculoskeletal:        General: Normal range of motion.     Cervical back: Normal range of motion and neck supple.  Skin:    General: Skin is warm and dry.     Capillary Refill: Capillary refill takes less than 2 seconds.  Neurological:     General: No  focal deficit present.     Mental Status: He is alert and oriented to person, place, and time.     Deep Tendon Reflexes: Reflexes normal.  Psychiatric:        Mood and Affect: Mood normal.        Behavior: Behavior normal.     ED Results / Procedures / Treatments   Labs (all labs ordered are listed, but only abnormal results are displayed) Results for orders placed or performed during the hospital encounter of 10/06/23  Comprehensive metabolic panel   Collection Time: 10/07/23 12:27 AM  Result Value Ref Range   Sodium 136 135 - 145 mmol/L   Potassium 3.3 (L) 3.5 - 5.1 mmol/L   Chloride 106 98 - 111 mmol/L   CO2 24 22 - 32 mmol/L   Glucose, Bld 102 (H) 70 - 99 mg/dL   BUN 13 6 - 20 mg/dL    Creatinine, Ser 9.60 0.61 - 1.24 mg/dL   Calcium 8.9 8.9 - 45.4 mg/dL   Total Protein 6.5 6.5 - 8.1 g/dL   Albumin 3.8 3.5 - 5.0 g/dL   AST 17 15 - 41 U/L   ALT 12 0 - 44 U/L   Alkaline Phosphatase 41 38 - 126 U/L   Total Bilirubin 0.6 <1.2 mg/dL   GFR, Estimated >09 >81 mL/min   Anion gap 6 5 - 15  CBC with Differential/Platelet   Collection Time: 10/07/23  1:00 AM  Result Value Ref Range   WBC 7.4 4.0 - 10.5 K/uL   RBC 4.46 4.22 - 5.81 MIL/uL   Hemoglobin 13.2 13.0 - 17.0 g/dL   HCT 19.1 47.8 - 29.5 %   MCV 90.4 80.0 - 100.0 fL   MCH 29.6 26.0 - 34.0 pg   MCHC 32.8 30.0 - 36.0 g/dL   RDW 62.1 30.8 - 65.7 %   Platelets 225 150 - 400 K/uL   nRBC 0.0 0.0 - 0.2 %   Neutrophils Relative % 63 %   Neutro Abs 4.6 1.7 - 7.7 K/uL   Lymphocytes Relative 28 %   Lymphs Abs 2.1 0.7 - 4.0 K/uL   Monocytes Relative 7 %   Monocytes Absolute 0.5 0.1 - 1.0 K/uL   Eosinophils Relative 2 %   Eosinophils Absolute 0.1 0.0 - 0.5 K/uL   Basophils Relative 0 %   Basophils Absolute 0.0 0.0 - 0.1 K/uL   Immature Granulocytes 0 %   Abs Immature Granulocytes 0.03 0.00 - 0.07 K/uL  Urinalysis, Routine w reflex microscopic -Urine, Clean Catch   Collection Time: 10/07/23  3:41 AM  Result Value Ref Range   Color, Urine YELLOW YELLOW   APPearance CLEAR CLEAR   Specific Gravity, Urine 1.011 1.005 - 1.030   pH 6.0 5.0 - 8.0   Glucose, UA NEGATIVE NEGATIVE mg/dL   Hgb urine dipstick NEGATIVE NEGATIVE   Bilirubin Urine NEGATIVE NEGATIVE   Ketones, ur NEGATIVE NEGATIVE mg/dL   Protein, ur NEGATIVE NEGATIVE mg/dL   Nitrite NEGATIVE NEGATIVE   Leukocytes,Ua NEGATIVE NEGATIVE   No results found.  Radiology No results found.  Procedures Procedures    Medications Ordered in ED Medications  droperidol (INAPSINE) 2.5 MG/ML injection 1.25 mg (1.25 mg Intravenous Given 10/07/23 0211)    ED Course/ Medical Decision Making/ A&P                                 Medical Decision Making Patient with  2 weeks of abdominal pain   Amount and/or Complexity of Data Reviewed External Data Reviewed: notes.    Details: Previous notes reviewed  Labs: ordered.    Details: Urine is negative for UTI, normal white count 7.4, normal hemoglobin 13.2, normal platelets. Normal sodium 136, potassium slight low 3.3 normal creatinine   Risk Prescription drug management. Risk Details: Patient is well appearing.  Vitals, exam and labs are bening and reassuring, no further pain.  Stable for discharge.      Final Clinical Impression(s) / ED Diagnoses Final diagnoses:  Pain of upper abdomen  Return for intractable cough, coughing up blood, fevers > 100.4 unrelieved by medication, shortness of breath, intractable vomiting, chest pain, shortness of breath, weakness, numbness, changes in speech, facial asymmetry, abdominal pain, passing out, Inability to tolerate liquids or food, cough, altered mental status or any concerns. No signs of systemic illness or infection. The patient is nontoxic-appearing on exam and vital signs are within normal limits.  I have reviewed the triage vital signs and the nursing notes. Pertinent labs & imaging results that were available during my care of the patient were reviewed by me and considered in my medical decision making (see chart for details). After history, exam, and medical workup I feel the patient has been appropriately medically screened and is safe for discharge home. Pertinent diagnoses were discussed with the patient. Patient was given return precautions.     Rx / DC Orders ED Discharge Orders     None         Trynity Skousen, MD 10/07/23 [phone]

## 2023-10-07 NOTE — ED Notes (Signed)
RN to bedside. Attempted to obtain urine sample. Pt stated they did not have to urinate at this time. This RN provided a handheld urinal and advised pt to try to urinate.

## 2023-10-07 NOTE — ED Triage Notes (Signed)
PT reports stomach pain and headache and weakness for 2 weeks. Denies nausea, vomiting, diarrhea, or constipation.

## 2024-01-24 ENCOUNTER — Other Ambulatory Visit: Payer: Self-pay

## 2024-01-24 ENCOUNTER — Emergency Department (HOSPITAL_COMMUNITY)
Admission: EM | Admit: 2024-01-24 | Discharge: 2024-01-25 | Disposition: A | Discharge status: Home / Self Care | Payer: MEDICAID | Attending: Emergency Medicine | Admitting: Emergency Medicine

## 2024-01-24 ENCOUNTER — Encounter (HOSPITAL_COMMUNITY): Payer: Self-pay

## 2024-01-24 DIAGNOSIS — Z711 Person with feared health complaint in whom no diagnosis is made: Secondary | ICD-10-CM

## 2024-01-24 DIAGNOSIS — Z202 Contact with and (suspected) exposure to infections with a predominantly sexual mode of transmission: Secondary | ICD-10-CM | POA: Insufficient documentation

## 2024-01-24 MED ORDER — STERILE WATER FOR INJECTION IJ SOLN
INTRAMUSCULAR | Status: AC
  Administered 2024-01-24: 2.1 mL
  Filled 2024-01-24: qty 10

## 2024-01-24 MED ORDER — CEFTRIAXONE SODIUM 1 G IJ SOLR
500.0000 mg | Freq: Once | INTRAMUSCULAR | Status: AC
  Administered 2024-01-24: 500 mg via INTRAMUSCULAR
  Filled 2024-01-24: qty 10

## 2024-01-24 MED ORDER — DOXYCYCLINE HYCLATE 100 MG PO TABS
100.0000 mg | ORAL_TABLET | Freq: Once | ORAL | Status: AC
  Administered 2024-01-24: 100 mg via ORAL
  Filled 2024-01-24: qty 1

## 2024-01-24 NOTE — ED Triage Notes (Signed)
 Pt reports he had unprotected sex with someone with a possible STD. Pt c/o urinary frequency and genital itching. No discharge

## 2024-01-25 LAB — URINALYSIS, ROUTINE W REFLEX MICROSCOPIC
Bacteria, UA: NONE SEEN
Bilirubin Urine: NEGATIVE
Glucose, UA: NEGATIVE mg/dL
Hgb urine dipstick: NEGATIVE
Ketones, ur: NEGATIVE mg/dL
Nitrite: NEGATIVE
Protein, ur: NEGATIVE mg/dL
Specific Gravity, Urine: 1.003 — ABNORMAL LOW (ref 1.005–1.030)
pH: 6 (ref 5.0–8.0)

## 2024-01-25 LAB — HIV ANTIBODY (ROUTINE TESTING W REFLEX): HIV Screen 4th Generation wRfx: NONREACTIVE

## 2024-01-25 MED ORDER — DOXYCYCLINE HYCLATE 100 MG PO CAPS: 100.0000 mg | ORAL_CAPSULE | Freq: Two times a day (BID) | ORAL | 0 refills | Status: DC

## 2024-01-25 NOTE — ED Provider Notes (Signed)
 [place] EMERGENCY DEPARTMENT AT [facility] Provider Note   CSN: [id number] Arrival date & time: 01/24/24  2146     History  Chief Complaint  Patient presents with   Exposure to STD    David Blankenship is a 38 y.o. male.  HPI     This is a 38 year old male who presents with concern for STDs.  Patient reports that he has had unprotected sex multiple times.  He does not generally use condoms.  He reports urinary frequency and genital itching.  Also states that he noted a sore on his penis.  Home Medications Prior to Admission medications   Medication Sig Start Date End Date Taking? Authorizing Provider  doxycycline (VIBRAMYCIN) 100 MG capsule Take 1 capsule (100 mg total) by mouth 2 (two) times daily. 01/25/24  Yes Emrah Ariola, Mayer Masker, MD  divalproex (DEPAKOTE ER) 500 MG 24 hr tablet Take 1,000 mg by mouth daily. 04/01/22   [provider]  haloperidol (HALDOL) 10 MG tablet Take 1 tablet (10 mg total) by mouth daily. 08/08/23 10/07/23  Park Pope, MD  haloperidol decanoate (HALDOL DECANOATE) 100 MG/ML injection Inject 1 mL (100 mg total) into the muscle every 28 (twenty-eight) days. 02/07/23   Shanna Cisco, NP  traZODone (DESYREL) 50 MG tablet Take 3 tablets (150 mg total) by mouth at bedtime. 08/08/23 10/07/23  Park Pope, MD      Allergies    Acetaminophen, Bee pollen, and Shellfish allergy    Review of Systems   Review of Systems  Constitutional:  Negative for fever.  Genitourinary:  Positive for dysuria and genital sores.  All other systems reviewed and are negative.   Physical Exam Updated Vital Signs BP (!) 140/94   Pulse 84   Temp 98.2 F (36.8 C) (Oral)   Resp 17   Ht 1.778 m (5\' 10" )   Wt 85.3 kg   SpO2 100%   BMI 26.98 kg/m  Physical Exam Vitals and nursing note reviewed. Exam conducted with a chaperone present.  Constitutional:      Appearance: He is well-developed. He is not ill-appearing.  HENT:     Head: Normocephalic  and atraumatic.  Eyes:     Pupils: Pupils are equal, round, and reactive to light.  Cardiovascular:     Rate and Rhythm: Normal rate and regular rhythm.  Pulmonary:     Effort: Pulmonary effort is normal. No respiratory distress.  Abdominal:     Palpations: Abdomen is soft.     Tenderness: There is no abdominal tenderness.  Genitourinary:    Comments: Normal circumcised penis, no obvious ulcers or penile lesions noted, no lymphadenopathy Musculoskeletal:     Cervical back: Neck supple.  Lymphadenopathy:     Cervical: No cervical adenopathy.  Skin:    General: Skin is warm and dry.  Neurological:     Mental Status: He is alert and oriented to person, place, and time.  Psychiatric:        Mood and Affect: Mood normal.     ED Results / Procedures / Treatments   Labs (all labs ordered are listed, but only abnormal results are displayed) Labs Reviewed  URINALYSIS, ROUTINE W REFLEX MICROSCOPIC - Abnormal; Notable for the following components:      Result Value   Color, Urine STRAW (*)    Specific Gravity, Urine 1.003 (*)    Leukocytes,Ua TRACE (*)    All other components within normal limits  HIV ANTIBODY (ROUTINE TESTING W REFLEX)  GC/CHLAMYDIA PROBE AMP ([place]) NOT AT [facility]    EKG None  Radiology No results found.  Procedures Procedures    Medications Ordered in ED Medications  doxycycline (VIBRA-TABS) tablet 100 mg (100 mg Oral Given 01/24/24 2354)  cefTRIAXone (ROCEPHIN) injection 500 mg (500 mg Intramuscular Given 01/24/24 2350)  sterile water (preservative free) injection (2.1 mLs  Given 01/24/24 2354)    ED Course/ Medical Decision Making/ A&P                                 Medical Decision Making Amount and/or Complexity of Data Reviewed Labs: ordered.  Risk Prescription drug management.   This patient presents to the ED for concern of dysuria, STD, this involves an extensive number of treatment options, and is a complaint that carries with it a  high risk of complications and morbidity.  I considered the following differential and admission for this acute, potentially life threatening condition.  The differential diagnosis includes STD, urinary tract infection  MDM:    This is a 38 year old male who presents with concerns for potential STD.  He is nontoxic and vital signs are notable for blood pressure 140/94.  He has no obvious penile lesions or adenopathy.  No discharge noted.  He was tested and treated for gonorrhea and chlamydia.  Urinalysis does not indicate a UTI.  Discussed safe sex practices and using condoms.  Will discharge with doxycycline.  (Labs, imaging, consults)  Labs: I Ordered, and personally interpreted labs.  The pertinent results include: Gonorrhea, chlamydia, HIV, urinalysis  Imaging Studies ordered: I ordered imaging studies including none I independently visualized and interpreted imaging. I agree with the radiologist interpretation  Additional history obtained from chart review.  External records from outside source obtained and reviewed including prior evaluations  Cardiac Monitoring: The patient was not maintained on a cardiac monitor.  If on the cardiac monitor, I personally viewed and interpreted the cardiac monitored which showed an underlying rhythm of: N/A  Reevaluation: After the interventions noted above, I reevaluated the patient and found that they have :stayed the same  Social Determinants of Health:  history of schizophrenia  Disposition: Discharge  Co morbidities that complicate the patient evaluation  Past Medical History:  Diagnosis Date   ADHD (attention deficit hyperactivity disorder)    Asthma    Bipolar 1 disorder (HCC)    Depression    Schizophrenia (HCC)    Seizures (HCC)      Medicines Meds ordered this encounter  Medications   doxycycline (VIBRA-TABS) tablet 100 mg   cefTRIAXone (ROCEPHIN) injection 500 mg    Antibiotic Indication::   STD   sterile water  (preservative free) injection    [place]: cabinet override   doxycycline (VIBRAMYCIN) 100 MG capsule    Sig: Take 1 capsule (100 mg total) by mouth 2 (two) times daily.    Dispense:  20 capsule    Refill:  0    I have reviewed the patients home medicines and have made adjustments as needed  Problem List / ED Course: Problem List Items Addressed This Visit   None Visit Diagnoses       Concern about STD in male without diagnosis    -  Primary                   Final Clinical Impression(s) / ED Diagnoses Final diagnoses:  Concern about STD in male without diagnosis  Rx / DC Orders ED Discharge Orders          Ordered    doxycycline (VIBRAMYCIN) 100 MG capsule  2 times daily        01/25/24 0034              Kabria Hetzer, Mayer Masker, MD 01/25/24 [phone]

## 2024-01-25 NOTE — Discharge Instructions (Signed)
 You were seen today with concerns for potential STDs.  You were tested and treated for gonorrhea and chlamydia.  You should abstain from sexual activity for the next 10 days.  You should always use condoms.  If testing comes back positive you will be notified.  You will then need to have partners treated.  Take antibiotics as prescribed for the next 10 days.

## 2024-01-27 ENCOUNTER — Telehealth (HOSPITAL_COMMUNITY): Payer: Self-pay

## 2024-01-27 LAB — GC/CHLAMYDIA PROBE AMP (~~LOC~~) NOT AT ARMC
Chlamydia: NEGATIVE
Comment: NEGATIVE
Comment: NORMAL
Neisseria Gonorrhea: NEGATIVE

## 2024-01-27 NOTE — Telephone Encounter (Signed)
 Pt/Pharmacy is requesting for a refill of Trazodone to be sent to pharmacy.     JNL

## 2024-01-27 NOTE — Telephone Encounter (Signed)
 Hello,   Pt/Pharmacy is requesting for buspirone to be sent to pharmacy.   Last refill 3/05

## 2024-01-27 NOTE — Telephone Encounter (Signed)
 I have not seen patient in over 6 months and he has no refills. He needs to be seen before prescriptions can be sent.

## 2024-01-30 ENCOUNTER — Telehealth (HOSPITAL_COMMUNITY): Payer: Self-pay | Admitting: *Deleted

## 2024-01-30 NOTE — Telephone Encounter (Signed)
 Pharmacy faxed request for rxs for trazodone and buspar. Per Drs note dated 01/27/24 he will not get rx.s till is seen by a provider as its been over 6 months since doing so.

## 2024-02-20 ENCOUNTER — Emergency Department (HOSPITAL_COMMUNITY)
Admission: EM | Admit: 2024-02-20 | Discharge: 2024-02-20 | Disposition: A | Discharge status: Home / Self Care | Payer: MEDICAID | Attending: Emergency Medicine | Admitting: Emergency Medicine

## 2024-02-20 ENCOUNTER — Ambulatory Visit (HOSPITAL_COMMUNITY): Payer: MEDICAID | Admitting: Physician Assistant

## 2024-02-20 ENCOUNTER — Other Ambulatory Visit: Payer: Self-pay

## 2024-02-20 ENCOUNTER — Encounter (HOSPITAL_COMMUNITY): Payer: Self-pay | Admitting: Physician Assistant

## 2024-02-20 VITALS — BP 106/69 | HR 59 | Temp 97.6°F | Ht 70.0 in | Wt 182.8 lb

## 2024-02-20 DIAGNOSIS — F209 Schizophrenia, unspecified: Secondary | ICD-10-CM

## 2024-02-20 DIAGNOSIS — R443 Hallucinations, unspecified: Secondary | ICD-10-CM | POA: Diagnosis present

## 2024-02-20 DIAGNOSIS — G479 Sleep disorder, unspecified: Secondary | ICD-10-CM

## 2024-02-20 DIAGNOSIS — G4700 Insomnia, unspecified: Secondary | ICD-10-CM | POA: Insufficient documentation

## 2024-02-20 DIAGNOSIS — F411 Generalized anxiety disorder: Secondary | ICD-10-CM

## 2024-02-20 DIAGNOSIS — J45909 Unspecified asthma, uncomplicated: Secondary | ICD-10-CM | POA: Insufficient documentation

## 2024-02-20 LAB — CBC
HCT: 42.8 % (ref 39.0–52.0)
Hemoglobin: 14.4 g/dL (ref 13.0–17.0)
MCH: 29.4 pg (ref 26.0–34.0)
MCHC: 33.6 g/dL (ref 30.0–36.0)
MCV: 87.3 fL (ref 80.0–100.0)
Platelets: 201 10*3/uL (ref 150–400)
RBC: 4.9 MIL/uL (ref 4.22–5.81)
RDW: 14.4 % (ref 11.5–15.5)
WBC: 7 10*3/uL (ref 4.0–10.5)
nRBC: 0 % (ref 0.0–0.2)

## 2024-02-20 LAB — COMPREHENSIVE METABOLIC PANEL WITH GFR
ALT: 16 U/L (ref 0–44)
AST: 20 U/L (ref 15–41)
Albumin: 4 g/dL (ref 3.5–5.0)
Alkaline Phosphatase: 36 U/L — ABNORMAL LOW (ref 38–126)
Anion gap: 9 (ref 5–15)
BUN: 13 mg/dL (ref 6–20)
CO2: 23 mmol/L (ref 22–32)
Calcium: 9 mg/dL (ref 8.9–10.3)
Chloride: 103 mmol/L (ref 98–111)
Creatinine, Ser: 0.98 mg/dL (ref 0.61–1.24)
GFR, Estimated: >60 mL/min (ref >60)
Glucose, Bld: 91 mg/dL (ref 70–99)
Potassium: 3.5 mmol/L (ref 3.5–5.1)
Sodium: 135 mmol/L (ref 135–145)
Total Bilirubin: 0.8 mg/dL (ref 0.0–1.2)
Total Protein: 6.9 g/dL (ref 6.5–8.1)

## 2024-02-20 LAB — RAPID URINE DRUG SCREEN, HOSP PERFORMED
Amphetamines: NOT DETECTED
Barbiturates: NOT DETECTED
Benzodiazepines: NOT DETECTED
Cocaine: NOT DETECTED
Opiates: NOT DETECTED
Tetrahydrocannabinol: POSITIVE — AB

## 2024-02-20 LAB — ETHANOL: Alcohol, Ethyl (B): <15 mg/dL (ref <15)

## 2024-02-20 MED ORDER — BUSPIRONE HCL 10 MG PO TABS
10.0000 mg | ORAL_TABLET | Freq: Two times a day (BID) | ORAL | Status: DC
Start: 2024-02-20 — End: 2024-02-21
  Administered 2024-02-20: 10 mg via ORAL
  Filled 2024-02-20: qty 1

## 2024-02-20 MED ORDER — HALOPERIDOL 10 MG PO TABS: 10.0000 mg | ORAL_TABLET | Freq: Every day | ORAL | 0 refills | Status: DC

## 2024-02-20 MED ORDER — HALOPERIDOL 5 MG PO TABS
10.0000 mg | ORAL_TABLET | Freq: Every day | ORAL | Status: DC
  Administered 2024-02-20: 10 mg via ORAL
  Filled 2024-02-20: qty 2

## 2024-02-20 MED ORDER — BUSPIRONE HCL 10 MG PO TABS: 10.0000 mg | ORAL_TABLET | Freq: Two times a day (BID) | ORAL | 0 refills | Status: DC

## 2024-02-20 MED ORDER — TRAZODONE HCL 150 MG PO TABS: 150.0000 mg | ORAL_TABLET | Freq: Every day | ORAL | 0 refills | Status: DC

## 2024-02-20 MED ORDER — TRAZODONE HCL 50 MG PO TABS
150.0000 mg | ORAL_TABLET | Freq: Every day | ORAL | Status: DC
  Administered 2024-02-20: 150 mg via ORAL
  Filled 2024-02-20: qty 1

## 2024-02-20 NOTE — ED Triage Notes (Signed)
 Pt reports auditory hallucinations. Sts the voices are telling him to commit crimes and kill people. Denies SI. HX bipolar 1 and schizophrenia. Pt sts he's been out of his prescribed medications for 3 months.

## 2024-02-20 NOTE — Progress Notes (Unsigned)
 BH MD/PA/NP OP Progress Note  02/20/2024 3:34 PM David Blankenship  MRN:  [id number]  Chief Complaint:  Chief Complaint  Patient presents with   Follow-up   Medication Refill   HPI: ***  [name] ***  Visit Diagnosis:    ICD-10-CM   1. Generalized anxiety disorder  F41.1 busPIRone  (BUSPAR ) 10 MG tablet    2. Schizophrenia, unspecified type (HCC)  F20.9 haloperidol  (HALDOL ) 10 MG tablet    3. Sleep disturbance  G47.9 traZODone  (DESYREL ) 150 MG tablet      Past Psychiatric History:  Diagnoses: Schizophrenia, Substance induced mood disorder, substance induced psychosis,  Previous psychiatrist/therapist: Ardena Koyanagi Hospitalizations: 1 per chart review Suicide attempts: denies SIB: denies Hx of violence towards others: denies Current access to guns: denies Hx of trauma/abuse: denies Substance use: cannabis and nicotine   Past Medical History:  Past Medical History:  Diagnosis Date   ADHD (attention deficit hyperactivity disorder)    Asthma    Bipolar 1 disorder (HCC)    Depression    Schizophrenia (HCC)    Seizures (HCC)     Past Surgical History:  Procedure Laterality Date   COLONOSCOPY WITH ESOPHAGOGASTRODUODENOSCOPY (EGD)  10/28/2012   WUJ:[id number] tiny distal esophageal erosions consistent with mild erosive reflux esophagitis. Hiatal hernia. 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 no h.pylori   NO PAST SURGERIES     none      Family Psychiatric History:  None reported  Family History:  Family History  Problem Relation Age of Onset   Seizures Father    Asthma Other    Seizures Other    Cancer Other        aunt    Social History:  Social History   Socioeconomic History   Marital status: Significant Other    Spouse name: Not on file   Number of children: Not on file   Years of education: Not  on file   Highest education level: Not on file  Occupational History   Not on file  Tobacco Use   Smoking status: Every Day    Current packs/day: 0.50    Average packs/day: 0.5 packs/day for 12.0 years (6.0 ttl pk-yrs)    Types: Cigarettes, Cigars   Smokeless tobacco: Never   Tobacco comments:    5 or 6 cigarettes daily  Vaping Use   Vaping status: Never Used  Substance and Sexual Activity   Alcohol use: Yes    Alcohol/week: 35.0 standard drinks of alcohol    Types: 35 Cans of beer per week   Drug use: Yes    Types: Marijuana   Sexual activity: Yes    Birth control/protection: Condom  Other Topics Concern   Not on file  Social History Narrative   Not on file   Social Drivers of Health   Financial Resource Strain: Unknown (09/27/2021)   Received from [employer], [facility]   Overall Financial Resource Strain (CARDIA)    Difficulty of Paying Living Expenses: Patient declined  Food Insecurity: Not on file  Transportation Needs: Not on file  Physical Activity: Not on file  Stress: Unknown (09/27/2021)   Received from [facility], [facility]   [employer] of Occupational Health - Occupational Stress Questionnaire    Feeling of Stress : Patient declined  Social Connections: Unknown (02/20/2022)   Received from [facility], [facility]   Social Network    Social Network:  Not on file    Allergies:  Allergies  Allergen Reactions   Acetaminophen  Hives    Pt reports that he takes tylenol  but it was previously reported as an allergy.   Bee Pollen Hives   Shellfish Allergy Hives    Metabolic Disorder Labs: No results found for: "HGBA1C", "MPG" No results found for: "PROLACTIN" No results found for: "CHOL", "TRIG", "HDL", "CHOLHDL", "VLDL", "LDLCALC" Lab Results  Component Value Date   TSH 1.124 03/18/2014   TSH 6.017 (H) 11/26/2012    Therapeutic Level Labs: No results found for: "LITHIUM" Lab Results  Component Value Date   VALPROATE <10 (L)  08/26/2021   VALPROATE <10 (L) 01/21/2021   No results found for: "CBMZ"  Current Medications: Current Outpatient Medications  Medication Sig Dispense Refill   busPIRone  (BUSPAR ) 10 MG tablet Take 1 tablet (10 mg total) by mouth 2 (two) times daily. 180 tablet 0   divalproex  (DEPAKOTE  ER) 500 MG 24 hr tablet Take 1,000 mg by mouth daily.     doxycycline  (VIBRAMYCIN ) 100 MG capsule Take 1 capsule (100 mg total) by mouth 2 (two) times daily. 20 capsule 0   haloperidol  (HALDOL ) 10 MG tablet Take 1 tablet (10 mg total) by mouth daily. 90 tablet 0   haloperidol  decanoate (HALDOL  DECANOATE) 100 MG/ML injection Inject 1 mL (100 mg total) into the muscle every 28 (twenty-eight) days. 1 mL 11   traZODone  (DESYREL ) 150 MG tablet Take 1 tablet (150 mg total) by mouth at bedtime. 90 tablet 0   No current facility-administered medications for this visit.     Musculoskeletal: Strength & Muscle Tone: within normal limits Gait & Station: normal Patient leans: N/A  Psychiatric Specialty Exam: Review of Systems  Psychiatric/Behavioral:  Negative for decreased concentration, dysphoric mood, hallucinations, self-injury, sleep disturbance and suicidal ideas. The patient is not nervous/anxious and is not hyperactive.     Blood pressure 106/69, pulse (!) 59, temperature 97.6 F (36.4 C), temperature source Oral, height 5\' 10"  (1.778 m), weight 182 lb 12.8 oz (82.9 kg), SpO2 100%.Body mass index is 26.23 kg/m.  General Appearance: Casual  Eye Contact:  Good  Speech:  Clear and Coherent and Normal Rate  Volume:  Normal  Mood:  Euthymic  Affect:  Appropriate  Thought Process:  Coherent, Goal Directed, and Descriptions of Associations: Intact  Orientation:  Full (Time, Place, and Person)  Thought Content: WDL   Suicidal Thoughts:  No  Homicidal Thoughts:  No  Memory:  Immediate;   Good Recent;   Good Remote;   Fair  Judgement:  Intact  Insight:  Fair  Psychomotor Activity:  Normal   Concentration:  Concentration: Good and Attention Span: Good  Recall:  Good  Fund of Knowledge: Fair  Language: Good  Akathisia:  No  Handed:  Left  AIMS (if indicated): done, 0  Assets:  Communication Skills Desire for Improvement Financial Resources/Insurance Housing Intimacy Physical Health Resilience Social Support Talents/Skills  ADL's:  Intact  Cognition: WNL  Sleep:  Good   Screenings: AIMS    Flowsheet Row Office Visit from 02/20/2024 in [facility] Office Visit from 02/07/2023 in [facility]  AIMS Total Score 0 0      GAD-7    Flowsheet Row Office Visit from 02/20/2024 in [facility] Office Visit from 02/07/2023 in [facility]  Total GAD-7 Score 0 0      PHQ2-9    Flowsheet Row [facility] Visit from 02/07/2023 in [facility]  PHQ-2 Total Score 0 0  PHQ-9 Total Score -- 0      Flowsheet Row Office Visit from 02/20/2024 in [facility] ED from 01/24/2024 in [facility] Emergency Department at [facility] ED from 10/06/2023 in [facility] Emergency Department at [facility]  C-SSRS RISK CATEGORY No Risk No Risk No Risk        Assessment and Plan: ***  ***  Collaboration of Care: Collaboration of Care: Medication Management AEB provider managing patient's psychiatric medications and Psychiatrist AEB patient being followed by a mental health provider  Patient/Guardian was advised Release of Information must be obtained prior to any record release in order to collaborate their care with an outside provider. Patient/Guardian was advised if they have not already done so to contact the registration department to sign all necessary forms in order for us  to release information regarding their care.   Consent:  Patient/Guardian gives verbal consent for treatment and assignment of benefits for services provided during this visit. Patient/Guardian expressed understanding and agreed to proceed.   1. Schizophrenia, unspecified type (HCC)  - haloperidol  (HALDOL ) 10 MG tablet; Take 1 tablet (10 mg total) by mouth daily.  Dispense: 90 tablet; Refill: 0  2. Sleep disturbance  - traZODone  (DESYREL ) 150 MG tablet; Take 1 tablet (150 mg total) by mouth at bedtime.  Dispense: 90 tablet; Refill: 0  3. Generalized anxiety disorder (Primary)  - busPIRone  (BUSPAR ) 10 MG tablet; Take 1 tablet (10 mg total) by mouth 2 (two) times daily.  Dispense: 180 tablet; Refill: 0  Patient to follow up with Augusta Blizzard, MD on 03/31/2024 Provider spent a total of 18 minutes with the patient/reviewing the patient's chart  Gates Kasal, PA 02/20/2024, 3:34 PM

## 2024-02-20 NOTE — ED Provider Notes (Addendum)
 [place] EMERGENCY DEPARTMENT AT [facility] Provider Note   CSN: [id number] Arrival date & time: 02/20/24  2045     History  Chief Complaint  Patient presents with   Hallucinations    David Blankenship is a 38 y.o. male.  HPI Patient reportedly came in for hallucinations.  Had told nursing he was hearing voices to commit crimes to kill people.  States he been off his medicines for 3 months.  States he got them refilled today after seeing the behavioral health urgent care.   Past Medical History:  Diagnosis Date   ADHD (attention deficit hyperactivity disorder)    Asthma    Bipolar 1 disorder (HCC)    Depression    Schizophrenia (HCC)    Seizures (HCC)     Home Medications Prior to Admission medications   Medication Sig Start Date End Date Taking? Authorizing Provider  busPIRone  (BUSPAR ) 10 MG tablet Take 1 tablet (10 mg total) by mouth 2 (two) times daily. 02/20/24   Nwoko, Uchenna E, PA  divalproex  (DEPAKOTE  ER) 500 MG 24 hr tablet Take 1,000 mg by mouth daily. 04/01/22   [provider]  doxycycline  (VIBRAMYCIN ) 100 MG capsule Take 1 capsule (100 mg total) by mouth 2 (two) times daily. 01/25/24   Horton, Vonzella Guernsey, MD  haloperidol  (HALDOL ) 10 MG tablet Take 1 tablet (10 mg total) by mouth daily. 02/20/24 02/19/25  Nwoko, Uchenna E, PA  haloperidol  decanoate (HALDOL  DECANOATE) 100 MG/ML injection Inject 1 mL (100 mg total) into the muscle every 28 (twenty-eight) days. 02/07/23   Arlyne Bering, NP  traZODone  (DESYREL ) 150 MG tablet Take 1 tablet (150 mg total) by mouth at bedtime. 02/20/24 02/19/25  Nwoko, Uchenna E, PA      Allergies    Acetaminophen , Bee pollen, and Shellfish allergy    Review of Systems   Review of Systems  Physical Exam Updated Vital Signs BP 113/74 (BP Location: Right Arm)   Pulse 83   Temp 98.2 F (36.8 C) (Oral)   Resp 19   SpO2 99%  Physical Exam Vitals and nursing note reviewed.  HENT:     Head: Atraumatic.   Cardiovascular:     Rate and Rhythm: Regular rhythm.  Pulmonary:     Breath sounds: No wheezing.  Abdominal:     Tenderness: There is no abdominal tenderness.  Skin:    Capillary Refill: Capillary refill takes less than 2 seconds.  Neurological:     Mental Status: He is alert.  Psychiatric:     Comments:  mildly pressured.  Does not appear to be responding to internal     ED Results / Procedures / Treatments   Labs (all labs ordered are listed, but only abnormal results are displayed) Labs Reviewed  COMPREHENSIVE METABOLIC PANEL WITH GFR - Abnormal; Notable for the following components:      Result Value   Alkaline Phosphatase 36 (*)    All other components within normal limits  RAPID URINE DRUG SCREEN, HOSP PERFORMED - Abnormal; Notable for the following components:   Tetrahydrocannabinol POSITIVE (*)    All other components within normal limits  ETHANOL  CBC    EKG None  Radiology No results found.  Procedures Procedures    Medications Ordered in ED Medications  busPIRone  (BUSPAR ) tablet 10 mg (10 mg Oral Given 02/20/24 2216)  haloperidol  (HALDOL ) tablet 10 mg (10 mg Oral Given 02/20/24 2216)  traZODone  (DESYREL ) tablet 150 mg (150 mg Oral Given 02/20/24 2216)  ED Course/ Medical Decision Making/ A&P                                 Medical Decision Making Amount and/or Complexity of Data Reviewed Labs: ordered.  Risk Prescription drug management.   Patient with schizophrenia and bipolar.  Reportedly poorly controlled.  Recently started back on medicines.  Questionable hallucinations.  Will get basic blood work and discuss with TTS.  Blood work reassuring.  Patient is medically cleared.  Patient has been transferred back to the psych ED.  However not willing to stay.  At this point I do not think he is enough risk to himself to IVC.  Wants to leave.  Discharge put in.  Can follow-up with behavioral health urgent care again.  Reviewed the note but is not  done yet from earlier today.  States will take his evening medicines here however.          Final Clinical Impression(s) / ED Diagnoses Final diagnoses:  Schizophrenia, unspecified type [facility])    Rx / DC Orders ED Discharge Orders     None         Mozell Arias, MD 02/20/24 2147    Mozell Arias, MD 02/20/24 2228

## 2024-02-20 NOTE — ED Notes (Signed)
Pt. Unable to give urine at this time. 

## 2024-02-20 NOTE — ED Notes (Signed)
 Backpack and 2 belonging bags placed in locker #34

## 2024-02-20 NOTE — ED Notes (Signed)
 Pt. In burgundy scrubs and wanded by security. Pt. Has 2 belongings bags and 1 black bookbag. Pt. Has 1 cell phone and wallet with $2 dollars. Pt. Belongings locked up in triage behind the nurses station.

## 2024-03-28 ENCOUNTER — Emergency Department (HOSPITAL_COMMUNITY)
Admission: EM | Admit: 2024-03-28 | Discharge: 2024-03-28 | Discharge status: Against Medical Advice | Payer: MEDICAID | Attending: Emergency Medicine | Admitting: Emergency Medicine

## 2024-03-28 ENCOUNTER — Other Ambulatory Visit: Payer: Self-pay

## 2024-03-28 DIAGNOSIS — W57XXXA Bitten or stung by nonvenomous insect and other nonvenomous arthropods, initial encounter: Secondary | ICD-10-CM | POA: Insufficient documentation

## 2024-03-28 DIAGNOSIS — Z5321 Procedure and treatment not carried out due to patient leaving prior to being seen by health care provider: Secondary | ICD-10-CM | POA: Diagnosis not present

## 2024-03-28 DIAGNOSIS — S60561A Insect bite (nonvenomous) of right hand, initial encounter: Secondary | ICD-10-CM | POA: Insufficient documentation

## 2024-03-28 NOTE — ED Triage Notes (Signed)
 Pt states he was bitten by an insect on his arm, now has right hand swelling and pain

## 2024-03-30 NOTE — Progress Notes (Signed)
 BH MD Outpatient Progress Note  03/30/2024 10:28 AM David Blankenship  MRN:  [id number]  Assessment:  David Blankenship presents for follow-up evaluation in-person. Today, 03/30/24, patient reports presenting for medication management follow up. He reports being without medications for ~1 month.  Given inconsistency with medication compliance, we plan to restart haloperidol , buspirone , and trazodone .  Patient is not a good candidate for Depakote  given inconsistency with showing up to appointments and ability to regularly obtain serum levels.  Will further explore patient's diagnosis of schizophrenia as he reports continued stability for approximately 1 month without any medications.  He denies substance use this visit despite admitting this last visit so it is unclear whether he is currently actively taking substances or not.  Identifying Information: David Blankenship is a 38 y.o. male with a history of cannabis use disorder, nicotine  use disorder, schizophrenia vs schizoaffective disorder, IBS, GERD, malingering who is an established patient with [facility] for medication management.   Plan:  # Schizophrenia vs Schizoaffective Disorder Past medication trials:  Status of problem: stable Interventions: -- Restart haloperidol  10 mg daily -- Stop Depakote  --Continue trazodone  to 150 mg at bedtime  -- Continue buspar  10 mg bid  # Nicotine  use disorder # Cannabis use disorder # Hx of alcohol abuse Past medication trials:  Interventions: -- advise cessation of all substance use   Return to care in 4 weeks. Plan to go to shot clinic next Tuesday and get blood draw next Wednesday   Patient was given contact information for behavioral health clinic and was instructed to call 911 for emergencies.    Patient and plan of care will be discussed with the Attending MD, Dr. Eligio Grumbling, who agrees with the above statement and plan.   Subjective:  Chief Complaint:  Medication Management   Interval History:  Patient seen and assessed for follow-up.  He reports not having any of his medications for the past month besides buspirone .  He denies SI/HI/AVH.  He reports he is not using any substances at this time.  He reports sleep and appetite have been good.  He reports wanting to be back on his psychotropic medications due to them helping with his irritability.  He was amenable to restarting buspirone , trazodone , and haloperidol .  He had no acute concerns today.  Visit Diagnosis:  No diagnosis found.   Past Psychiatric History:  Diagnoses: Schizophrenia, Substance induced mood disorder, substance induced psychosis,  Previous psychiatrist/therapist: Ardena Koyanagi Hospitalizations: 1 per chart review Suicide attempts: denies SIB: denies Hx of violence towards others: denies Current access to guns: denies Hx of trauma/abuse: denies Substance use: cannabis and nicotine   Past Medical History:  Past Medical History:  Diagnosis Date   ADHD (attention deficit hyperactivity disorder)    Asthma    Bipolar 1 disorder (HCC)    Depression    Schizophrenia (HCC)    Seizures (HCC)     Past Surgical History:  Procedure Laterality Date   COLONOSCOPY WITH ESOPHAGOGASTRODUODENOSCOPY (EGD)  10/28/2012   QMV:[id number] tiny distal esophageal erosions consistent with mild erosive reflux esophagitis. Hiatal hernia. 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 no h.pylori   NO PAST SURGERIES     none       Family History:  Family History  Problem Relation Age of Onset   Seizures Father    Asthma Other    Seizures Other    Cancer Other  aunt    Social History:   Social History   Socioeconomic History   Marital status: Significant Other    Spouse name: Not on file   Number of children: Not on file   Years of  education: Not on file   Highest education level: Not on file  Occupational History   Not on file  Tobacco Use   Smoking status: Every Day    Current packs/day: 0.50    Average packs/day: 0.5 packs/day for 12.0 years (6.0 ttl pk-yrs)    Types: Cigarettes, Cigars   Smokeless tobacco: Never   Tobacco comments:    5 or 6 cigarettes daily  Vaping Use   Vaping status: Never Used  Substance and Sexual Activity   Alcohol use: Yes    Alcohol/week: 35.0 standard drinks of alcohol    Types: 35 Cans of beer per week   Drug use: Yes    Types: Marijuana   Sexual activity: Yes    Birth control/protection: Condom  Other Topics Concern   Not on file  Social History Narrative   Not on file   Social Drivers of Health   Financial Resource Strain: Unknown (09/27/2021)   Received from [employer], [facility]   Overall Financial Resource Strain (CARDIA)    Difficulty of Paying Living Expenses: Patient declined  Food Insecurity: Not on file  Transportation Needs: Not on file  Physical Activity: Not on file  Stress: Unknown (09/27/2021)   Received from [facility]   [employer] of Occupational Health - Occupational Stress Questionnaire    Feeling of Stress : Patient declined  Social Connections: Unknown (02/20/2022)   Received from [facility], [facility]   Social Network    Social Network: Not on file    Allergies:  Allergies  Allergen Reactions   Acetaminophen  Hives    Pt reports that he takes tylenol  but it was previously reported as an allergy.   Bee Pollen Hives   Shellfish Allergy Hives    Current Medications: Current Outpatient Medications  Medication Sig Dispense Refill   busPIRone  (BUSPAR ) 10 MG tablet Take 1 tablet (10 mg total) by mouth 2 (two) times daily. 180 tablet 0   divalproex  (DEPAKOTE  ER) 500 MG 24 hr tablet Take 1,000 mg by mouth daily.     doxycycline  (VIBRAMYCIN ) 100 MG capsule Take 1 capsule (100 mg total) by mouth 2 (two) times  daily. 20 capsule 0   haloperidol  (HALDOL ) 10 MG tablet Take 1 tablet (10 mg total) by mouth daily. 90 tablet 0   haloperidol  decanoate (HALDOL  DECANOATE) 100 MG/ML injection Inject 1 mL (100 mg total) into the muscle every 28 (twenty-eight) days. 1 mL 11   traZODone  (DESYREL ) 150 MG tablet Take 1 tablet (150 mg total) by mouth at bedtime. 90 tablet 0   No current facility-administered medications for this visit.    ROS: Review of Systems   Objective:  Psychiatric Specialty Exam: There were no vitals taken for this visit.There is no height or weight on file to calculate BMI.  General Appearance: Disheveled and Guarded  Eye Contact: Fair  Speech:  Clear and Coherent and Normal Rate  Volume:  Normal  Mood:  Euthymic  Affect:  Appropriate and Congruent  Thought Content: Logical   Suicidal Thoughts:  No  Homicidal Thoughts:  No  Thought Process:  Coherent and Goal Directed occasionally circumstantial  Orientation:  Full (Time, Place, and Person)    Memory: Remote;   Fair  Judgment:  Intact  Insight:  Fair  Concentration:  Concentration: Fair  Recall: not formally assessed   Fund of Knowledge: Fair  Language: Fair  Psychomotor Activity:  Normal  Akathisia:  No  AIMS (if indicated): done, 0  Assets:  Communication Skills Desire for Improvement Financial Resources/Insurance Housing Intimacy Leisure Time Physical Health Resilience Social Support Talents/Skills  ADL's:  Intact  Cognition: WNL  Sleep:  Fair   PE: General: well-appearing; no acute distress  Pulm: no increased work of breathing on room air  Strength & Muscle Tone: within normal limits Neuro: no focal neurological deficits observed  Gait & Station: normal  Metabolic Disorder Labs: No results found for: "HGBA1C", "MPG" No results found for: "PROLACTIN" No results found for: "CHOL", "TRIG", "HDL", "CHOLHDL", "VLDL", "LDLCALC" Lab Results  Component Value Date   TSH 1.124 03/18/2014   TSH 6.017 (H)  11/26/2012    Therapeutic Level Labs: No results found for: "LITHIUM" Lab Results  Component Value Date   VALPROATE <10 (L) 08/26/2021   VALPROATE <10 (L) 01/21/2021   No results found for: "CBMZ"  Screenings: AIMS    Flowsheet Row Office Visit from 02/20/2024 in [facility] Office Visit from 02/07/2023 in [facility]  AIMS Total Score 0 0      GAD-7    Flowsheet Row Office Visit from 02/20/2024 in [facility] Office Visit from 02/07/2023 in [facility]  Total GAD-7 Score 0 0      PHQ2-9    Flowsheet Row Office Visit from 02/20/2024 in [facility] Office Visit from 02/07/2023 in [facility]  PHQ-2 Total Score 0 0  PHQ-9 Total Score -- 0      Flowsheet Row ED from 03/28/2024 in [facility] Emergency Department at [facility] Most recent reading at 03/28/2024  1:30 PM ED from 02/20/2024 in [facility] Emergency Department at [facility] Most recent reading at 02/20/2024  8:51 PM Office Visit from 02/20/2024 in [facility] Most recent reading at 02/20/2024  8:27 AM  C-SSRS RISK CATEGORY No Risk No Risk No Risk       Collaboration of Care: Collaboration of Care:  Patient/Guardian was advised Release of Information must be obtained prior to any record release in order to collaborate their care with an outside provider. Patient/Guardian was advised if they have not already done so to contact the registration department to sign all necessary forms in order for us  to release information regarding their care.   Consent: Patient/Guardian gives verbal consent for treatment and assignment of benefits for services provided during this visit. Patient/Guardian expressed understanding and agreed to proceed.   Augusta Blizzard, MD 03/30/2024, 10:28 AM

## 2024-03-31 ENCOUNTER — Ambulatory Visit (INDEPENDENT_AMBULATORY_CARE_PROVIDER_SITE_OTHER): Payer: MEDICAID | Admitting: Student

## 2024-03-31 DIAGNOSIS — G479 Sleep disorder, unspecified: Secondary | ICD-10-CM | POA: Diagnosis not present

## 2024-03-31 DIAGNOSIS — F209 Schizophrenia, unspecified: Secondary | ICD-10-CM | POA: Diagnosis not present

## 2024-03-31 DIAGNOSIS — F411 Generalized anxiety disorder: Secondary | ICD-10-CM | POA: Diagnosis not present

## 2024-03-31 MED ORDER — BUSPIRONE HCL 15 MG PO TABS: 15.0000 mg | ORAL_TABLET | Freq: Two times a day (BID) | ORAL | 2 refills | Status: DC

## 2024-03-31 MED ORDER — HALOPERIDOL 20 MG PO TABS: 20.0000 mg | ORAL_TABLET | Freq: Every day | ORAL | 2 refills | Status: DC

## 2024-03-31 MED ORDER — TRAZODONE HCL 150 MG PO TABS: 150.0000 mg | ORAL_TABLET | Freq: Every day | ORAL | 2 refills | Status: DC

## 2024-04-07 ENCOUNTER — Other Ambulatory Visit: Payer: Self-pay

## 2024-04-07 ENCOUNTER — Emergency Department (HOSPITAL_COMMUNITY): Payer: MEDICAID

## 2024-04-07 ENCOUNTER — Emergency Department (HOSPITAL_COMMUNITY)
Admission: EM | Admit: 2024-04-07 | Discharge: 2024-04-07 | Disposition: A | Discharge status: Home / Self Care | Payer: MEDICAID | Attending: Emergency Medicine | Admitting: Emergency Medicine

## 2024-04-07 DIAGNOSIS — E860 Dehydration: Secondary | ICD-10-CM | POA: Insufficient documentation

## 2024-04-07 DIAGNOSIS — D72829 Elevated white blood cell count, unspecified: Secondary | ICD-10-CM | POA: Insufficient documentation

## 2024-04-07 DIAGNOSIS — R531 Weakness: Secondary | ICD-10-CM

## 2024-04-07 DIAGNOSIS — R1084 Generalized abdominal pain: Secondary | ICD-10-CM | POA: Insufficient documentation

## 2024-04-07 LAB — COMPREHENSIVE METABOLIC PANEL WITH GFR
ALT: 10 U/L (ref 0–44)
AST: 15 U/L (ref 15–41)
Albumin: 3.5 g/dL (ref 3.5–5.0)
Alkaline Phosphatase: 40 U/L (ref 38–126)
Anion gap: 8 (ref 5–15)
BUN: 10 mg/dL (ref 6–20)
CO2: 22 mmol/L (ref 22–32)
Calcium: 8.6 mg/dL — ABNORMAL LOW (ref 8.9–10.3)
Chloride: 104 mmol/L (ref 98–111)
Creatinine, Ser: 1.15 mg/dL (ref 0.61–1.24)
GFR, Estimated: >60 mL/min (ref >60)
Glucose, Bld: 94 mg/dL (ref 70–99)
Potassium: 3.8 mmol/L (ref 3.5–5.1)
Sodium: 134 mmol/L — ABNORMAL LOW (ref 135–145)
Total Bilirubin: 1.1 mg/dL (ref 0.0–1.2)
Total Protein: 5.9 g/dL — ABNORMAL LOW (ref 6.5–8.1)

## 2024-04-07 LAB — URINALYSIS, ROUTINE W REFLEX MICROSCOPIC
Bilirubin Urine: NEGATIVE
Glucose, UA: NEGATIVE mg/dL
Hgb urine dipstick: NEGATIVE
Ketones, ur: NEGATIVE mg/dL
Nitrite: NEGATIVE
Protein, ur: NEGATIVE mg/dL
Specific Gravity, Urine: 1.012 (ref 1.005–1.030)
pH: 5 (ref 5.0–8.0)

## 2024-04-07 LAB — CBC WITH DIFFERENTIAL/PLATELET
Abs Immature Granulocytes: 0.03 10*3/uL (ref 0.00–0.07)
Basophils Absolute: 0 10*3/uL (ref 0.0–0.1)
Basophils Relative: 0 %
Eosinophils Absolute: 0.1 10*3/uL (ref 0.0–0.5)
Eosinophils Relative: 1 %
HCT: 47.7 % (ref 39.0–52.0)
Hemoglobin: 16 g/dL (ref 13.0–17.0)
Immature Granulocytes: 0 %
Lymphocytes Relative: 22 %
Lymphs Abs: 2.4 10*3/uL (ref 0.7–4.0)
MCH: 29.7 pg (ref 26.0–34.0)
MCHC: 33.5 g/dL (ref 30.0–36.0)
MCV: 88.5 fL (ref 80.0–100.0)
Monocytes Absolute: 0.7 10*3/uL (ref 0.1–1.0)
Monocytes Relative: 6 %
Neutro Abs: 7.6 10*3/uL (ref 1.7–7.7)
Neutrophils Relative %: 71 %
Platelets: 194 10*3/uL (ref 150–400)
RBC: 5.39 MIL/uL (ref 4.22–5.81)
RDW: 13.4 % (ref 11.5–15.5)
WBC: 10.8 10*3/uL — ABNORMAL HIGH (ref 4.0–10.5)
nRBC: 0 % (ref 0.0–0.2)

## 2024-04-07 LAB — VALPROIC ACID LEVEL: Valproic Acid Lvl: <10 ug/mL — ABNORMAL LOW (ref 50–100)

## 2024-04-07 LAB — LIPASE, BLOOD: Lipase: 30 U/L (ref 11–51)

## 2024-04-07 MED ORDER — SODIUM CHLORIDE 0.9 % IV BOLUS
1000.0000 mL | Freq: Once | INTRAVENOUS | Status: AC
  Administered 2024-04-07: 1000 mL via INTRAVENOUS

## 2024-04-07 NOTE — ED Provider Notes (Signed)
 [place] EMERGENCY DEPARTMENT AT [facility] Provider Note   CSN: [id number] Arrival date & time: 04/07/24  1416     Patient presents with: Weakness and Abdominal Pain   David Blankenship is a 38 y.o. male.   Pt is a 38 yo male with pmhx significant for schizophrenia, and possibly seizures.  Pt said he was on the bus and had an epileptic seizure.  He denies injury.  No post ictal phase. However, sx happened after donating plasma today.  He has a little dizziness.  He is not on any seizure medications.  Pt is hungry now.         Prior to Admission medications   Medication Sig Start Date End Date Taking? Authorizing Provider  busPIRone  (BUSPAR ) 15 MG tablet Take 1 tablet (15 mg total) by mouth 2 (two) times daily. 03/31/24   Augusta Blizzard, MD  doxycycline  (VIBRAMYCIN ) 100 MG capsule Take 1 capsule (100 mg total) by mouth 2 (two) times daily. 01/25/24   Horton, Vonzella Guernsey, MD  haloperidol  (HALDOL ) 20 MG tablet Take 1 tablet (20 mg total) by mouth daily. 03/31/24 06/29/24  Augusta Blizzard, MD  traZODone  (DESYREL ) 150 MG tablet Take 1 tablet (150 mg total) by mouth at bedtime. 03/31/24 06/29/24  Augusta Blizzard, MD    Allergies: Acetaminophen , Bee pollen, and Shellfish allergy    Review of Systems  Neurological:  Positive for dizziness.  All other systems reviewed and are negative.   Updated Vital Signs BP 109/63   Pulse 71   Temp 98.6 F (37 C) (Oral)   Resp 15   SpO2 100%   Physical Exam Vitals and nursing note reviewed.  Constitutional:      Appearance: He is well-developed.  HENT:     Head: Normocephalic and atraumatic.     Mouth/Throat:     Mouth: Mucous membranes are moist.     Pharynx: Oropharynx is clear.   Eyes:     Extraocular Movements: Extraocular movements intact.     Pupils: Pupils are equal, round, and reactive to light.    Cardiovascular:     Rate and Rhythm: Normal rate and regular rhythm.     Heart sounds: Normal heart sounds.  Pulmonary:      Effort: Pulmonary effort is normal.     Breath sounds: Normal breath sounds.  Abdominal:     General: Abdomen is flat. Bowel sounds are normal.     Palpations: Abdomen is soft.     Tenderness: There is generalized abdominal tenderness.   Skin:    General: Skin is warm.     Capillary Refill: Capillary refill takes less than 2 seconds.   Neurological:     General: No focal deficit present.     Mental Status: He is alert and oriented to person, place, and time.   Psychiatric:        Mood and Affect: Mood normal.        Behavior: Behavior normal.     (all labs ordered are listed, but only abnormal results are displayed) Labs Reviewed  CBC WITH DIFFERENTIAL/PLATELET - Abnormal; Notable for the following components:      Result Value   WBC 10.8 (*)    All other components within normal limits  COMPREHENSIVE METABOLIC PANEL WITH GFR - Abnormal; Notable for the following components:   Sodium 134 (*)    Calcium 8.6 (*)    Total Protein 5.9 (*)    All other components within normal limits  URINALYSIS,  ROUTINE W REFLEX MICROSCOPIC - Abnormal; Notable for the following components:   Leukocytes,Ua TRACE (*)    Bacteria, UA RARE (*)    All other components within normal limits  LIPASE, BLOOD  VALPROIC ACID  LEVEL    EKG: None  Radiology: DG Chest Portable 1 View Result Date: 04/07/2024 CLINICAL DATA:  Upper and lower chest pain EXAM: PORTABLE CHEST 1 VIEW COMPARISON:  07/18/2014 FINDINGS: The lungs appear clear. Cardiac and mediastinal contours normal. No blunting of the costophrenic angles. No significant bony abnormality observed. IMPRESSION: 1. No active cardiopulmonary disease is radiographically apparent. Electronically Signed   By: Freida Jes M.D.   On: 04/07/2024 15:43     Procedures   Medications Ordered in the ED  sodium chloride  0.9 % bolus 1,000 mL (1,000 mLs Intravenous New Bag/Given 04/07/24 1515)                                    Medical Decision  Making Amount and/or Complexity of Data Reviewed Labs: ordered. Radiology: ordered.   This patient presents to the ED for concern of seizure, this involves an extensive number of treatment options, and is a complaint that carries with it a high risk of complications and morbidity.  The differential diagnosis includes seizure, pseudoseizure, syncope   Co morbidities that complicate the patient evaluation  chizophrenia, and possibly seizures   Additional history obtained:  Additional history obtained from epic chart review External records from outside source obtained and reviewed including EMS report   Lab Tests:  I Ordered, and personally interpreted labs.  The pertinent results include:  cbc with wbc elevated at 10.8, cmp nl, ua pending   Imaging Studies ordered:  I ordered imaging studies including cxr  I independently visualized and interpreted imaging which showed No active cardiopulmonary disease is radiographically apparent.  I agree with the radiologist interpretation   Cardiac Monitoring:  The patient was maintained on a cardiac monitor.  I personally viewed and interpreted the cardiac monitored which showed an underlying rhythm of: nsr   Medicines ordered and prescription drug management:  I ordered medication including ivfs  for sx  Reevaluation of the patient after these medicines showed that the patient improved I have reviewed the patients home medicines and have made adjustments as needed  Critical Interventions:  ivfs   Problem List / ED Course:  Seizure like activity:  likely non seizure and due to giving plasma today.  BP slightly low upon arrival, but has improved now.  Pt is eating and drinking and feels well.  He wants to go home and does not want to wait for his UA and depakote  level.  Pt knows to return if worse.     Reevaluation:  After the interventions noted above, I reevaluated the patient and found that they have :improved   Social  Determinants of Health:  Lives at home   Dispostion:  After consideration of the diagnostic results and the patients response to treatment, I feel that the patent would benefit from discharge with outpatient f/u.       Final diagnoses:  Weakness  Dehydration    ED Discharge Orders     None          Sueellen Emery, MD 04/07/24 1624

## 2024-04-07 NOTE — ED Notes (Signed)
 Provided patient and patients girlfriend with 2 sandwiches and juices.

## 2024-04-07 NOTE — ED Triage Notes (Signed)
 Pt presents to the ED via EMS from bus stop with complaints of weakness, dizziness and abdominal pain after donating plasma today around 12pm. Aox4  EMS vitals  BP 128/82 HR 99 O2 97% RA  RR 17

## 2024-04-15 ENCOUNTER — Emergency Department (HOSPITAL_COMMUNITY)
Admission: EM | Admit: 2024-04-15 | Discharge: 2024-04-15 | Discharge status: Against Medical Advice | Payer: MEDICAID | Attending: Emergency Medicine | Admitting: Emergency Medicine

## 2024-04-15 DIAGNOSIS — Z5321 Procedure and treatment not carried out due to patient leaving prior to being seen by health care provider: Secondary | ICD-10-CM | POA: Insufficient documentation

## 2024-04-15 DIAGNOSIS — M7918 Myalgia, other site: Secondary | ICD-10-CM | POA: Insufficient documentation

## 2024-04-15 DIAGNOSIS — R519 Headache, unspecified: Secondary | ICD-10-CM | POA: Diagnosis not present

## 2024-04-15 DIAGNOSIS — R109 Unspecified abdominal pain: Secondary | ICD-10-CM | POA: Insufficient documentation

## 2024-05-19 NOTE — Progress Notes (Deleted)
 [facility] MD Outpatient Progress Note  05/19/2024 4:52 PM David Blankenship  MRN:  [id number]  Assessment:  David Blankenship presents for follow-up evaluation in-person. He presents today reporting he has been overall stable but has been taking twice the amount of haloperidol  and trazodone  than prescribed and therefore is running out. He has been oversedated but reports no return of psychosis. Continuing haloperidol  at 20 mg and advised patient to only take 150 mg of trazodone  to avoid daytime sedation. UDS at ED showed THC positive but he denied substance use. Advised to stop using any cannabis based products if he is using as it will worsen anxiety. Recommend reconsidering LAI given history of medication nonadherence.   ***  Identifying Information: David Blankenship is a 38 y.o. male with a history of cannabis use disorder, nicotine  use disorder, schizophrenia vs schizoaffective disorder, IBS, GERD, malingering who is an established patient with [facility] for medication management. Patient is a poor historian and it remains unclear if his symptoms are more consistent with schizophrenia or substance induced psychosis. Past ED visits and psychiatric admission suggested schizoaffective disorder, bipolar disorder, or schizophrenia. However, his symptoms have always been in the context of substance use. He has been on LAI in the past and tolerated it well.   Plan:  # Schizophrenia vs Schizoaffective Disorder Past medication trials: depakote  (medication nonadherence) Interventions: -- Continue haloperidol  20 mg daily -- Continue trazodone  150 mg at bedtime  -- Increase buspar  15 mg bid  # Nicotine  use disorder # Cannabis use disorder # Hx of alcohol abuse Past medication trials:  Interventions: -- advise cessation of all substance use    Patient was given contact information for behavioral health clinic and was instructed to call 911 for emergencies.    Patient  and plan of care will be discussed with the Attending MD who agrees with the above statement and plan.   Subjective:  Chief Complaint: Medication Management   Interval History:  Patient seen ***.  Patient reports feeling *** today. Since the previous visit, ***. Regarding medications, patient notes ***. Patient reports the following adverse effects: ***.   Patient reports *** sleep, ***. Patient reports *** appetite, ***.   Patient rates anxiety a ***/10, depression a ***/10, and anger a ***/10.   Patient denies current SI, HI, and AVH. ***  Stressors include ***.   Substance use: ***  Hows he taking the meds (issue in the past with overtaking his meds)  Visit Diagnosis:  No diagnosis found.    Past Psychiatric History:  Diagnoses: Schizophrenia, Substance induced mood disorder, substance induced psychosis,  Previous psychiatrist/therapist: Zane Bach Hospitalizations: 1 per chart review Suicide attempts: denies SIB: denies Hx of violence towards others: denies Current access to guns: denies Hx of trauma/abuse: denies Substance use: cannabis and nicotine   Past Medical History:  Past Medical History:  Diagnosis Date   ADHD (attention deficit hyperactivity disorder)    Asthma    Bipolar 1 disorder (HCC)    Depression    Schizophrenia (HCC)    Seizures (HCC)     Past Surgical History:  Procedure Laterality Date   COLONOSCOPY WITH ESOPHAGOGASTRODUODENOSCOPY (EGD)  10/28/2012   MFM:Dpwhoz tiny distal esophageal erosions consistent with mild erosive reflux esophagitis. Hiatal hernia. 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 no h.pylori   NO PAST SURGERIES     none       Family  History:  Family History  Problem Relation Age of Onset   Seizures Father    Asthma Other    Seizures Other    Cancer Other        aunt     Social History:  Living: *** Occupation: *** Relationship: *** Children: *** Support: *** Legal History: ***  Social History   Socioeconomic History   Marital status: Significant Other    Spouse name: Not on file   Number of children: Not on file   Years of education: Not on file   Highest education level: Not on file  Occupational History   Not on file  Tobacco Use   Smoking status: Every Day    Current packs/day: 0.50    Average packs/day: 0.5 packs/day for 12.0 years (6.0 ttl pk-yrs)    Types: Cigarettes, Cigars   Smokeless tobacco: Never   Tobacco comments:    5 or 6 cigarettes daily  Vaping Use   Vaping status: Never Used  Substance and Sexual Activity   Alcohol use: Yes    Alcohol/week: 35.0 standard drinks of alcohol    Types: 35 Cans of beer per week   Drug use: Yes    Types: Marijuana   Sexual activity: Yes    Birth control/protection: Condom  Other Topics Concern   Not on file  Social History Narrative   Not on file   Social Drivers of Health   Financial Resource Strain: Unknown (09/27/2021)   Received from [facility]   Overall Financial Resource Strain (CARDIA)    Difficulty of Paying Living Expenses: Patient declined  Food Insecurity: Not on file  Transportation Needs: Not on file  Physical Activity: Not on file  Stress: Unknown (09/27/2021)   Received from [facility] - Occupational Stress Questionnaire    Feeling of Stress : Patient declined  Social Connections: Unknown (02/20/2022)   Received from [facility]   Social Network    Social Network: Not on file    Allergies:  Allergies  Allergen Reactions   Acetaminophen  Hives    Pt reports that he takes tylenol  but it was previously reported as an allergy.   Bee Pollen Hives   Shellfish Allergy Hives    Current Medications: Current Outpatient Medications  Medication Sig Dispense Refill   busPIRone  (BUSPAR ) 15 MG tablet Take 1 tablet  (15 mg total) by mouth 2 (two) times daily. 60 tablet 2   doxycycline  (VIBRAMYCIN ) 100 MG capsule Take 1 capsule (100 mg total) by mouth 2 (two) times daily. 20 capsule 0   haloperidol  (HALDOL ) 20 MG tablet Take 1 tablet (20 mg total) by mouth daily. 30 tablet 2   traZODone  (DESYREL ) 150 MG tablet Take 1 tablet (150 mg total) by mouth at bedtime. 30 tablet 2   No current facility-administered medications for this visit.    Objective: Psychiatric Specialty Exam: General Appearance: appears at stated age, casually dressed and groomed ***  Behavior: pleasant and cooperative ***  Psychomotor Activity: no psychomotor agitation or retardation noted ***  Eye Contact: fair *** Speech: normal amount, volume and fluency ***   Mood: euthymic *** Affect: congruent, pleasant and interactive ***  Thought Process: linear, goal directed, no circumstantial or tangential thought process noted, no racing thoughts or flight of ideas *** Descriptions of Associations: intact ***  Thought Content Hallucinations: denies AH, VH , does not appear responding to stimuli *** Delusions: no paranoia, delusions of control, grandeur, ideas of reference, thought  broadcasting, and magical thinking *** Suicidal Thoughts: denies SI, intention, plan *** Homicidal Thoughts: denies HI, intention, plan ***  Alertness/Orientation: alert and fully oriented ***  Insight: fair*** Judgment: fair***  Memory: intact ***  Executive Functions  Concentration: intact *** Attention Span: fair *** Recall: intact *** Fund of Knowledge: fair ***  Physical Exam *** General: Pleasant, well-appearing ***. No acute distress. Pulmonary: Normal effort. No wheezing or rales. Skin: No obvious rash or lesions. Neuro: A&Ox3.No focal deficit.  Review of Systems *** No reported symptoms   Metabolic Disorder Labs: No results found for: HGBA1C, MPG No results found for: PROLACTIN No results found for: CHOL, TRIG,  HDL, CHOLHDL, VLDL, LDLCALC Lab Results  Component Value Date   TSH 1.124 03/18/2014   TSH 6.017 (H) 11/26/2012    Therapeutic Level Labs: No results found for: LITHIUM Lab Results  Component Value Date   VALPROATE <10 (L) 04/07/2024   VALPROATE <10 (L) 08/26/2021   No results found for: CBMZ  Screenings: AIMS    Flowsheet Row Office Visit from 02/20/2024 in [facility] Office Visit from 02/07/2023 in [facility]  AIMS Total Score 0 0   GAD-7    Flowsheet Row Office Visit from 02/20/2024 in [facility] Office Visit from 02/07/2023 in [facility]  Total GAD-7 Score 0 0   PHQ2-9    Flowsheet Row Office Visit from 02/20/2024 in [facility] Office Visit from 02/07/2023 in [facility]  PHQ-2 Total Score 0 0  PHQ-9 Total Score -- 0   Flowsheet Row ED from 04/15/2024 in [facility] Emergency Department at [facility] ED from 04/07/2024 in [facility] Emergency Department at [facility] ED from 03/28/2024 in [facility] Emergency Department at [facility]  C-SSRS RISK CATEGORY No Risk No Risk No Risk   Ismael Franco, MD PGY-3 Psychiatry Resident

## 2024-05-28 ENCOUNTER — Encounter (HOSPITAL_COMMUNITY): Payer: MEDICAID | Admitting: Psychiatry

## 2024-06-06 ENCOUNTER — Emergency Department (HOSPITAL_COMMUNITY)
Admission: EM | Admit: 2024-06-06 | Discharge: 2024-06-06 | Disposition: A | Discharge status: Home / Self Care | Payer: MEDICAID | Attending: Emergency Medicine | Admitting: Emergency Medicine

## 2024-06-06 ENCOUNTER — Encounter (HOSPITAL_COMMUNITY): Payer: Self-pay

## 2024-06-06 ENCOUNTER — Other Ambulatory Visit: Payer: Self-pay

## 2024-06-06 DIAGNOSIS — J45909 Unspecified asthma, uncomplicated: Secondary | ICD-10-CM | POA: Insufficient documentation

## 2024-06-06 DIAGNOSIS — R519 Headache, unspecified: Secondary | ICD-10-CM | POA: Diagnosis present

## 2024-06-06 MED ORDER — IBUPROFEN 200 MG PO TABS
600.0000 mg | ORAL_TABLET | Freq: Once | ORAL | Status: AC
  Administered 2024-06-06: 600 mg via ORAL
  Filled 2024-06-06: qty 3

## 2024-06-06 NOTE — ED Provider Notes (Signed)
 [place] EMERGENCY DEPARTMENT AT [facility] Provider Note  CSN: [id number] Arrival date & time: 06/06/24 0122  Chief Complaint(s) Seizures  Pt brought in by EMS for feeling like he is going to have a seizure coming on for past 30 minutes, picked up from [place]. No medication adjustments. Taking meds as prescribed. Seen at [facility] earlier yesterday and discharged after being treated for a seizure he had at noon. Pt awake and alert, A&Ox4, Nad noted in triage.   HPI David Blankenship is a 38 y.o. male here for mild headache and feeling like he is going to have a seizure.  States that he had a seizure earlier today in the street.  Denies any focal deficits.  No visual changes.  No recent fevers.  Then he has any other physical complaints.  The history is provided by the patient.    Past Medical History Past Medical History:  Diagnosis Date   ADHD (attention deficit hyperactivity disorder)    Asthma    Bipolar 1 disorder (HCC)    Depression    Schizophrenia (HCC)    Seizures (HCC)    Patient Active Problem List   Diagnosis Date Noted   Insomnia 02/20/2024   Sleep disturbance 02/20/2024   Generalized anxiety disorder 02/20/2024   Altered mental status 07/19/2022   Substance induced mood disorder (HCC) 04/01/2022   Malingering 09/18/2021   Suicidal ideation 09/18/2021   Abdominal pain, right upper quadrant 07/15/2014   IBS (irritable bowel syndrome) 06/11/2014   Constipation 04/20/2013   Anemia 02/04/2013   Thrombocytopenia, unspecified (HCC) 02/04/2013   Gastro-esophageal reflux disease with esophagitis 02/04/2013   Alcohol abuse 12/26/2011   Cannabis abuse 12/26/2011   Schizophrenia (HCC) 12/25/2011   Home Medication(s) Prior to Admission medications   Medication Sig Start Date End Date Taking? Authorizing Provider  busPIRone  (BUSPAR ) 15 MG tablet Take 1 tablet (15 mg total) by mouth 2 (two) times daily. Patient taking differently: Take 15 mg by mouth 3 (three)  times daily. 03/31/24  Yes Lynnette Barter, MD  divalproex  (DEPAKOTE  ER) 500 MG 24 hr tablet Take 500 mg by mouth 2 (two) times daily. 05/28/24  Yes [provider]  haloperidol  (HALDOL ) 20 MG tablet Take 1 tablet (20 mg total) by mouth daily. 03/31/24 06/29/24 Yes Lynnette Barter, MD  traZODone  (DESYREL ) 150 MG tablet Take 1 tablet (150 mg total) by mouth at bedtime. 03/31/24 06/29/24 Yes Lynnette Barter, MD  doxycycline  (VIBRAMYCIN ) 100 MG capsule Take 1 capsule (100 mg total) by mouth 2 (two) times daily. Patient not taking: Reported on 06/06/2024 01/25/24   Horton, Charmaine FALCON, MD                                                                                                                                    Allergies Acetaminophen , Bee pollen, and Shellfish allergy  Review of Systems Review of Systems As noted in HPI  Physical Exam  Vital Signs  I have reviewed the triage vital signs BP 120/66 (BP Location: Left Arm)   Pulse 64   Temp 97.8 F (36.6 C) (Oral)   Resp 18   Ht 5' 10 (1.778 m)   Wt 90.7 kg   SpO2 98%   BMI 28.70 kg/m   Physical Exam Vitals reviewed.  Constitutional:      General: He is not in acute distress.    Appearance: He is well-developed. He is not diaphoretic.  HENT:     Head: Normocephalic and atraumatic.     Right Ear: External ear normal.     Left Ear: External ear normal.     Nose: Nose normal.     Mouth/Throat:     Mouth: Mucous membranes are moist.  Eyes:     General: No scleral icterus.    Conjunctiva/sclera: Conjunctivae normal.  Neck:     Trachea: Phonation normal.  Cardiovascular:     Rate and Rhythm: Normal rate and regular rhythm.  Pulmonary:     Effort: Pulmonary effort is normal. No respiratory distress.     Breath sounds: No stridor.  Abdominal:     General: There is no distension.  Musculoskeletal:        General: Normal range of motion.     Cervical back: Normal range of motion.  Neurological:     Mental Status: He is alert and oriented  to person, place, and time.  Psychiatric:        Behavior: Behavior normal.     ED Results and Treatments Labs (all labs ordered are listed, but only abnormal results are displayed) Labs Reviewed - No data to display                                                                                                                       EKG  EKG Interpretation Date/Time:    Ventricular Rate:    PR Interval:    QRS Duration:    QT Interval:    QTC Calculation:   R Axis:      Text Interpretation:         Radiology No results found.  Medications Ordered in ED Medications  ibuprofen  (ADVIL ) tablet 600 mg (600 mg Oral Given 06/06/24 0311)   Procedures Procedures  (including critical care time) Medical Decision Making / ED Course   Medical Decision Making Risk OTC drugs.    Mild headache Non focal neuro exam.  No fever. Doubt meningitis.  Doubt IIH. No recent head trauma. Doubt intracranial bleed.  No indication for imaging.  Oral motrin  given.      Final Clinical Impression(s) / ED Diagnoses Final diagnoses:  Mild headache   The patient appears reasonably screened and/or stabilized for discharge and I doubt any other medical condition or other [facility] requiring further screening, evaluation, or treatment in the ED at this time. I have discussed the findings, Dx and Tx plan with the patient/family who expressed understanding and agree(s) with the  plan. Discharge instructions discussed at length. The patient/family was given strict return precautions who verbalized understanding of the instructions. No further questions at time of discharge.  Disposition: Discharge  Condition: Good  ED Discharge Orders     None         Follow Up: Primary care provider  Call  to schedule an appointment for close follow up    This chart was dictated using voice recognition software.  Despite best efforts to proofread,  errors can occur which can change the documentation  meaning.    Trine Raynell Moder, MD 06/06/24 [phone]

## 2024-06-06 NOTE — ED Triage Notes (Signed)
 Pt brought in by EMS for feeling like he is going to have a seizure coming on for past 30 minutes, picked up from [employer]. No medication adjustments. Taking meds as prescribed. Seen at [facility] earlier yesterday and discharged after being treated for a seizure he had at noon. Pt awake and alert, A&Ox4, Nad noted in triage.

## 2024-06-06 NOTE — ED Notes (Signed)
 Patient requested bus pass and ambulated out of er without difficulty.

## 2024-07-30 ENCOUNTER — Emergency Department (HOSPITAL_COMMUNITY): Payer: MEDICAID

## 2024-07-30 ENCOUNTER — Other Ambulatory Visit: Payer: Self-pay

## 2024-07-30 ENCOUNTER — Encounter (HOSPITAL_COMMUNITY): Payer: Self-pay | Admitting: Emergency Medicine

## 2024-07-30 ENCOUNTER — Emergency Department (HOSPITAL_COMMUNITY)
Admission: EM | Admit: 2024-07-30 | Discharge: 2024-07-30 | Disposition: A | Discharge status: Home / Self Care | Payer: MEDICAID | Attending: Emergency Medicine | Admitting: Emergency Medicine

## 2024-07-30 DIAGNOSIS — Z5329 Procedure and treatment not carried out because of patient's decision for other reasons: Secondary | ICD-10-CM | POA: Insufficient documentation

## 2024-07-30 DIAGNOSIS — M791 Myalgia, unspecified site: Secondary | ICD-10-CM | POA: Insufficient documentation

## 2024-07-30 DIAGNOSIS — B372 Candidiasis of skin and nail: Secondary | ICD-10-CM | POA: Insufficient documentation

## 2024-07-30 DIAGNOSIS — R052 Subacute cough: Secondary | ICD-10-CM | POA: Insufficient documentation

## 2024-07-30 DIAGNOSIS — R52 Pain, unspecified: Secondary | ICD-10-CM

## 2024-07-30 DIAGNOSIS — Z5321 Procedure and treatment not carried out due to patient leaving prior to being seen by health care provider: Secondary | ICD-10-CM

## 2024-07-30 DIAGNOSIS — R21 Rash and other nonspecific skin eruption: Secondary | ICD-10-CM | POA: Diagnosis present

## 2024-07-30 LAB — CBC WITH DIFFERENTIAL/PLATELET
Abs Immature Granulocytes: 0.02 K/uL (ref 0.00–0.07)
Basophils Absolute: 0 K/uL (ref 0.0–0.1)
Basophils Relative: 0 %
Eosinophils Absolute: 0.1 K/uL (ref 0.0–0.5)
Eosinophils Relative: 1 %
HCT: 49.3 % (ref 39.0–52.0)
Hemoglobin: 15.7 g/dL (ref 13.0–17.0)
Immature Granulocytes: 0 %
Lymphocytes Relative: 31 %
Lymphs Abs: 2.5 K/uL (ref 0.7–4.0)
MCH: 29.1 pg (ref 26.0–34.0)
MCHC: 31.8 g/dL (ref 30.0–36.0)
MCV: 91.3 fL (ref 80.0–100.0)
Monocytes Absolute: 0.5 K/uL (ref 0.1–1.0)
Monocytes Relative: 7 %
Neutro Abs: 4.9 K/uL (ref 1.7–7.7)
Neutrophils Relative %: 61 %
Platelets: 258 K/uL (ref 150–400)
RBC: 5.4 MIL/uL (ref 4.22–5.81)
RDW: 14.5 % (ref 11.5–15.5)
WBC: 8 K/uL (ref 4.0–10.5)
nRBC: 0 % (ref 0.0–0.2)

## 2024-07-30 LAB — URINALYSIS, ROUTINE W REFLEX MICROSCOPIC
Bacteria, UA: NONE SEEN
Bilirubin Urine: NEGATIVE
Glucose, UA: NEGATIVE mg/dL
Hgb urine dipstick: NEGATIVE
Ketones, ur: NEGATIVE mg/dL
Leukocytes,Ua: NEGATIVE
Nitrite: NEGATIVE
Protein, ur: NEGATIVE mg/dL
Renal Epithelial: <1
Specific Gravity, Urine: 1.024 (ref 1.005–1.030)
pH: 5 (ref 5.0–8.0)

## 2024-07-30 LAB — COMPREHENSIVE METABOLIC PANEL WITH GFR
ALT: 7 U/L (ref 0–44)
AST: 21 U/L (ref 15–41)
Albumin: 4.1 g/dL (ref 3.5–5.0)
Alkaline Phosphatase: 45 U/L (ref 38–126)
Anion gap: 9 (ref 5–15)
BUN: 10 mg/dL (ref 6–20)
CO2: 25 mmol/L (ref 22–32)
Calcium: 9.3 mg/dL (ref 8.9–10.3)
Chloride: 102 mmol/L (ref 98–111)
Creatinine, Ser: 1.07 mg/dL (ref 0.61–1.24)
GFR, Estimated: >60 mL/min (ref >60)
Glucose, Bld: 83 mg/dL (ref 70–99)
Potassium: 4 mmol/L (ref 3.5–5.1)
Sodium: 137 mmol/L (ref 135–145)
Total Bilirubin: 1.2 mg/dL (ref 0.0–1.2)
Total Protein: 6.2 g/dL — ABNORMAL LOW (ref 6.5–8.1)

## 2024-07-30 LAB — RESP PANEL BY RT-PCR (RSV, FLU A&B, COVID)  RVPGX2
Influenza A by PCR: NEGATIVE
Influenza B by PCR: NEGATIVE
Resp Syncytial Virus by PCR: NEGATIVE
SARS Coronavirus 2 by RT PCR: NEGATIVE

## 2024-07-30 LAB — CK: Total CK: 276 U/L (ref 49–397)

## 2024-07-30 MED ORDER — NYSTATIN-TRIAMCINOLONE 100000-0.1 UNIT/GM-% EX CREA: TOPICAL_CREAM | CUTANEOUS | 0 refills | Status: AC

## 2024-07-30 NOTE — ED Triage Notes (Signed)
 Patient c/o Flu like symptoms and wants to be check for STD. Patient report itching and rash on his genitals. Patient denies penile discharge. Patient denies dysuria.

## 2024-07-30 NOTE — ED Provider Notes (Signed)
  EMERGENCY DEPARTMENT AT [facility] Provider Note   CSN: [id number] Arrival date & time: 07/30/24  1607     Patient presents with: Flu like symptoms and Exposure to STD   David Blankenship is a 38 y.o. male with history of bipolar and states of hernia presents emerged from today for evaluation of groin rash for the past week.  Reports itching occasionally.  Denies any dysuria, hematuria, urgency, frequency, or penile drip.  Denies any pain or swelling into his testicles, penis, or scrotum.  Reports has been having some diffuse body aches for the past 2 weeks as well as a cough but no recorded fevers, runny nose, or nasal congestion.  Denies any nausea or vomiting.  Denies any trouble breathing.  Only has some chest pain with coughing but not otherwise.  Has no known exposures to STDs however was concerning with the itching rash.  This is his main concern.  Exposure to STD Pertinent negatives include no abdominal pain and no shortness of breath.       Prior to Admission medications   Medication Sig Start Date End Date Taking? Authorizing Provider  busPIRone  (BUSPAR ) 15 MG tablet Take 1 tablet (15 mg total) by mouth 2 (two) times daily. Patient taking differently: Take 15 mg by mouth 3 (three) times daily. 03/31/24   Lynnette Barter, MD  divalproex  (DEPAKOTE  ER) 500 MG 24 hr tablet Take 500 mg by mouth 2 (two) times daily. 05/28/24   [provider]  doxycycline  (VIBRAMYCIN ) 100 MG capsule Take 1 capsule (100 mg total) by mouth 2 (two) times daily. Patient not taking: Reported on 06/06/2024 01/25/24   Horton, Charmaine FALCON, MD  haloperidol  (HALDOL ) 20 MG tablet Take 1 tablet (20 mg total) by mouth daily. 03/31/24 06/29/24  Lynnette Barter, MD  traZODone  (DESYREL ) 150 MG tablet Take 1 tablet (150 mg total) by mouth at bedtime. 03/31/24 06/29/24  Lynnette Barter, MD    Allergies: Acetaminophen , Bee pollen, and Shellfish allergy    Review of Systems  Constitutional:  Negative for  chills and fever.  HENT:  Negative for sore throat.   Respiratory:  Positive for cough. Negative for shortness of breath.   Gastrointestinal:  Negative for abdominal pain, nausea and vomiting.  Genitourinary:  Negative for dysuria, hematuria, penile discharge, penile pain, penile swelling, scrotal swelling and testicular pain.  Musculoskeletal:  Positive for myalgias.  Skin:  Positive for rash.    Updated Vital Signs BP 109/82 (BP Location: Right Arm)   Pulse 92   Temp 97.8 F (36.6 C) (Oral)   Resp 16   Ht 6' 3 (1.905 m)   Wt 90.7 kg   SpO2 100%   BMI 25.00 kg/m   Physical Exam Vitals and nursing note reviewed. Exam conducted with a chaperone present Carmin, NT).  Constitutional:      General: He is not in acute distress.    Appearance: He is not toxic-appearing.     Comments: Disheveled, unkempt  Eyes:     General: No scleral icterus. Pulmonary:     Effort: Pulmonary effort is normal. No respiratory distress.  Genitourinary:    Comments: Patient has some irritation in the inguinal folds without lesion or ulceration.  With some macerated skin.  No fluctuance or induration.  No scrotal swelling or scrotal involvement.  No rash or lesion noted to the scrotum, penis, or mons pubis. Skin:    General: Skin is warm and dry.  Neurological:     Mental  Status: He is alert.     (all labs ordered are listed, but only abnormal results are displayed) Labs Reviewed  COMPREHENSIVE METABOLIC PANEL WITH GFR - Abnormal; Notable for the following components:      Result Value   Total Protein 6.2 (*)    All other components within normal limits  RESP PANEL BY RT-PCR (RSV, FLU A&B, COVID)  RVPGX2  CBC WITH DIFFERENTIAL/PLATELET  URINALYSIS, ROUTINE W REFLEX MICROSCOPIC  GC/CHLAMYDIA PROBE AMP ([place]) NOT AT [facility]    EKG: None  Radiology: No results found.  Procedures   Medications Ordered in the ED - No data to display  Medical Decision Making Amount and/or  Complexity of Data Reviewed Labs: ordered. Radiology: ordered.  Risk Prescription drug management.   38 y.o. male presents to the ER for evaluation of genital rash and flulike symptoms. Differential diagnosis includes but is not limited to viral illness, COVID, flu, RSV, bronchitis, PNA, SJS, TENS, contact dermatitic, allergic dermatitis, urticaria, shingles, RMSF, necrotizing fascitis. Vital signs unremarkable. Physical exam as noted above.   I independently reviewed and interpreted the patient's labs.  CMP shows a total protein of 6.2 otherwise no electrolyte or LFT abnormality.  CBC without leukocytosis or anemia.  Urinalysis shows amber and hazy urine otherwise unremarkable.  CK within normal limits.  Rester panels unremarkable.  GC pending.  Patient declined x-ray imaging.  He is hemodynamically stable.  Rash is more consistent with genital yeast infection positive next with some irritation from hair.  Does not appear to be cellulitis.  No lesions or blisters noted.  Does not appear to be any STD related.  He is not having any other urinary or penile/oral/testicle symptoms.  He eloped prior to discharge instructions and paperwork.  Portions of this report may have been transcribed using voice recognition software. Every effort was made to ensure accuracy; however, inadvertent computerized transcription errors may be present.    Final diagnoses:  Yeast dermatitis  Body aches  Subacute cough  Eloped from emergency department    ED Discharge Orders     None          Bernis Ernst, [place] 07/30/24 2009    Franklyn Sid SAILOR, MD 07/30/24 2141

## 2024-07-30 NOTE — Discharge Instructions (Addendum)
 You are seen in the emergency department today for evaluation of your rash.  I have prescribed you a cream to apply to the area twice daily.  Please make sure the area is staying dry and clean as this will prevent moisture and yeast formation.  Please follow your primary care provider.  You declined x-ray imaging here.  You can try gentle stretching for the body aches.  I have attached shelter information as well.  If you have any concerns, new or worsening symptoms, please return to the nearest emergency department for evaluation.  Contact a health care provider if: You have a fever. You have symptoms that go away and then return. You do not get better with treatment. You have symptoms that get worse. You have new symptoms. Get help right away if: Your swelling and inflammation become so severe that you cannot urinate.

## 2024-07-31 ENCOUNTER — Emergency Department (HOSPITAL_COMMUNITY)
Admission: EM | Admit: 2024-07-31 | Discharge: 2024-07-31 | Disposition: A | Discharge status: Home / Self Care | Payer: MEDICAID | Attending: Emergency Medicine | Admitting: Emergency Medicine

## 2024-07-31 DIAGNOSIS — Z760 Encounter for issue of repeat prescription: Secondary | ICD-10-CM | POA: Diagnosis not present

## 2024-07-31 DIAGNOSIS — R369 Urethral discharge, unspecified: Secondary | ICD-10-CM | POA: Diagnosis present

## 2024-07-31 LAB — WET PREP, GENITAL
Clue Cells Wet Prep HPF POC: NONE SEEN
Sperm: NONE SEEN
Trich, Wet Prep: NONE SEEN
WBC, Wet Prep HPF POC: <10 (ref <10)
Yeast Wet Prep HPF POC: NONE SEEN

## 2024-07-31 LAB — GC/CHLAMYDIA PROBE AMP (~~LOC~~) NOT AT ARMC
Chlamydia: NEGATIVE
Comment: NEGATIVE
Comment: NORMAL
Neisseria Gonorrhea: NEGATIVE

## 2024-07-31 MED ORDER — HALOPERIDOL 20 MG PO TABS: 20.0000 mg | ORAL_TABLET | Freq: Every day | ORAL | 0 refills | Status: DC

## 2024-07-31 MED ORDER — BUSPIRONE HCL 15 MG PO TABS: 15.0000 mg | ORAL_TABLET | Freq: Three times a day (TID) | ORAL | 0 refills | Status: DC

## 2024-07-31 MED ORDER — IBUPROFEN 800 MG PO TABS
800.0000 mg | ORAL_TABLET | Freq: Once | ORAL | Status: AC
  Administered 2024-07-31: 800 mg via ORAL
  Filled 2024-07-31: qty 1

## 2024-07-31 MED ORDER — DIVALPROEX SODIUM ER 500 MG PO TB24: 500.0000 mg | ORAL_TABLET | Freq: Two times a day (BID) | ORAL | 0 refills | Status: DC

## 2024-07-31 MED ORDER — TRAZODONE HCL 150 MG PO TABS: 150.0000 mg | ORAL_TABLET | Freq: Every day | ORAL | 0 refills | Status: DC

## 2024-07-31 NOTE — ED Notes (Signed)
 Pt given bus pass and AVS. Pt left before discharge vitals could be attained.

## 2024-07-31 NOTE — ED Triage Notes (Signed)
 Here for STD check and med refill - haldol , depakote , trazodone , buspirone .

## 2024-07-31 NOTE — ED Provider Notes (Signed)
 [place] EMERGENCY DEPARTMENT AT [facility] Provider Note   CSN: [id number] Arrival date & time: 07/31/24  1541     Patient presents with: SEXUALLY TRANSMITTED DISEASE and Medication Refill   David Blankenship is a 38 y.o. male presents today for an STD check and [facility] medication refill.  Patient reports penile discharge and concern for trichomonas.  Patient was tested and found to be negative for gonorrhea and chlamydia yesterday.  Patient denies any other symptoms or complaints at this time.    Medication Refill      Prior to Admission medications   Medication Sig Start Date End Date Taking? Authorizing Provider  busPIRone  (BUSPAR ) 15 MG tablet Take 1 tablet (15 mg total) by mouth 3 (three) times daily. 07/31/24  Yes Jonuel Butterfield N, PA-C  divalproex  (DEPAKOTE  ER) 500 MG 24 hr tablet Take 1 tablet (500 mg total) by mouth 2 (two) times daily. 07/31/24  Yes Shiniqua Groseclose N, PA-C  haloperidol  (HALDOL ) 20 MG tablet Take 1 tablet (20 mg total) by mouth daily. 07/31/24  Yes Destinae Neubecker N, PA-C  traZODone  (DESYREL ) 150 MG tablet Take 1 tablet (150 mg total) by mouth at bedtime. 07/31/24  Yes Psalm Arman N, PA-C  busPIRone  (BUSPAR ) 15 MG tablet Take 1 tablet (15 mg total) by mouth 2 (two) times daily. Patient taking differently: Take 15 mg by mouth 3 (three) times daily. 03/31/24   Lynnette Barter, MD  divalproex  (DEPAKOTE  ER) 500 MG 24 hr tablet Take 500 mg by mouth 2 (two) times daily. 05/28/24   [provider]  doxycycline  (VIBRAMYCIN ) 100 MG capsule Take 1 capsule (100 mg total) by mouth 2 (two) times daily. Patient not taking: Reported on 06/06/2024 01/25/24   Horton, Charmaine FALCON, MD  haloperidol  (HALDOL ) 20 MG tablet Take 1 tablet (20 mg total) by mouth daily. 03/31/24 06/29/24  Lynnette Barter, MD  nystatin-triamcinolone [facility] II) cream Apply to affected area daily 07/30/24   Bernis Ernst, PA-C  traZODone  (DESYREL ) 150 MG tablet Take 1 tablet (150 mg total) by mouth  at bedtime. 03/31/24 06/29/24  Lynnette Barter, MD    Allergies: Acetaminophen , Bee pollen, and Shellfish allergy    Review of Systems  Genitourinary:  Positive for penile discharge.    Updated Vital Signs BP (!) 143/93   Pulse 70   Temp 98.5 F (36.9 C) (Oral)   Resp 20   SpO2 100%   Physical Exam Vitals and nursing note reviewed.  Constitutional:      General: He is not in acute distress.    Appearance: Normal appearance. He is well-developed.  HENT:     Head: Normocephalic and atraumatic.     Right Ear: External ear normal.     Left Ear: External ear normal.     Nose: Nose normal.  Eyes:     Extraocular Movements: Extraocular movements intact.     Conjunctiva/sclera: Conjunctivae normal.  Cardiovascular:     Rate and Rhythm: Normal rate and regular rhythm.  Pulmonary:     Effort: Pulmonary effort is normal. No respiratory distress.  Abdominal:     Palpations: Abdomen is soft.  Musculoskeletal:     Cervical back: Normal range of motion and neck supple.  Skin:    General: Skin is warm and dry.     Capillary Refill: Capillary refill takes less than 2 seconds.  Neurological:     General: No focal deficit present.     Mental Status: He is alert and oriented to  person, place, and time.  Psychiatric:        Mood and Affect: Mood normal.     (all labs ordered are listed, but only abnormal results are displayed) Labs Reviewed  WET PREP, GENITAL    EKG: None  Radiology: No results found.   Procedures   Medications Ordered in the ED  ibuprofen  (ADVIL ) tablet 800 mg (800 mg Oral Given 07/31/24 1921)                                    Medical Decision Making Amount and/or Complexity of Data Reviewed Labs: ordered.  Risk Prescription drug management.   This patient presents to the ED for concern of penile discharge and medication refill differential diagnosis includes trichomonas, gonorrhea, chlamydia, UTI, yeast infection   Lab Tests:  I Ordered, and  personally interpreted labs.  The pertinent results include: Wet prep negative  Problem List / ED Course:  Considered for admission or further workup however patient's vital signs, physical exam, and labs are reassuring.  Patient given short course of behavioral health medications and advised to follow-up with [facility] further evaluation and treatment.  Patient given return precautions.  I feel patient is safe for discharge at this time.       Final diagnoses:  Penile discharge  Medication refill    ED Discharge Orders          Ordered    busPIRone  (BUSPAR ) 15 MG tablet  3 times daily        07/31/24 1948    divalproex  (DEPAKOTE  ER) 500 MG 24 hr tablet  2 times daily        07/31/24 1948    haloperidol  (HALDOL ) 20 MG tablet  Daily        07/31/24 1948    traZODone  (DESYREL ) 150 MG tablet  Daily at bedtime        07/31/24 1948               Francis Ileana SAILOR, PA-C 07/31/24 1949    Franklyn Sid SAILOR, MD 07/31/24 2029

## 2024-07-31 NOTE — ED Notes (Signed)
 Pt has a urine sample sent down to the lab

## 2024-07-31 NOTE — Discharge Instructions (Signed)
 Today you were seen for penile discharge and a medication refill.  Your workup while in the emergency department was reassuring.  Please follow-up with [facility] for further behavioral health needs.  Thank you for letting us  treat you today. After reviewing your labs and imaging, I feel you are safe to go home. Please follow up with your PCP in the next several days and provide them with your records from this visit. Return to the Emergency Room if pain becomes severe or symptoms worsen.

## 2024-08-05 ENCOUNTER — Emergency Department (HOSPITAL_COMMUNITY)
Admission: EM | Admit: 2024-08-05 | Discharge: 2024-08-05 | Discharge status: Against Medical Advice | Payer: MEDICAID | Attending: Emergency Medicine | Admitting: Emergency Medicine

## 2024-08-05 DIAGNOSIS — Z202 Contact with and (suspected) exposure to infections with a predominantly sexual mode of transmission: Secondary | ICD-10-CM | POA: Diagnosis present

## 2024-08-05 DIAGNOSIS — Z5321 Procedure and treatment not carried out due to patient leaving prior to being seen by health care provider: Secondary | ICD-10-CM | POA: Diagnosis not present

## 2024-08-05 NOTE — ED Notes (Signed)
 Pt checked in for STD screening.  Discussed with pt his previous results when he was here twice last week.  Pt reports this is all I needed to know and elected to leave prior to being seen.

## 2024-08-08 NOTE — ED Provider Notes (Signed)
 Patient left prior to being evaluated and assessed by EDP.   Minnie Tinnie BRAVO, PA 08/08/24 2229    Laurice Maude BROCKS, MD 08/15/24 1351

## 2024-08-10 NOTE — Progress Notes (Unsigned)
 [facility] MD Outpatient Progress Note  08/10/2024 8:03 PM David Blankenship  MRN:  [id number]  Assessment:  David Blankenship presents for follow-up evaluation in-person. Previously saw Dr. Prentice Espy for medication management and pt was continued on haloperidol  at 20 mg and was advised to only take 150 mg of trazodone  to avoid daytime sedation. UDS at previous ED visit showed THC positive but he denied substance use. Recommended reconsidering LAI given history of medication nonadherence.   Today, ***  Identifying Information: David Blankenship is a 38 y.o. male with a history of cannabis use disorder, nicotine  use disorder, schizophrenia vs schizoaffective disorder, IBS, GERD, malingering who is an established patient with [facility] for medication management. Patient is a poor historian and it remains unclear if his symptoms are more consistent with schizophrenia or substance induced psychosis. Past ED visits and psychiatric admission suggested schizoaffective disorder, bipolar disorder, or schizophrenia. However, his symptoms have always been in the context of substance use. He has been on LAI in the past and tolerated it well.   Plan:  # Schizophrenia vs Schizoaffective Disorder Past medication trials: depakote  (medication nonadherence) -- Continue haloperidol  at 20 mg daily  -- Labs: CBC and CMP on 10/25; needs TSH, A1c, and lipid panel  -- EKG on 03/2024- Qtc 430 -- Continue trazodone  to 150 mg at bedtime  -- Continue Buspar  15 mg bid  # Nicotine  use disorder # Cannabis use disorder # Hx of alcohol abuse -- Advise cessation of all substance use  Patient was given contact information for behavioral health clinic and was instructed to call 911 for emergencies.    Patient and plan of care will be discussed with the Attending MD who agrees with the above statement and plan.   Subjective:  Chief Complaint: Medication Management   Interval History:  Patient  seen ***.  Patient reports feeling *** today. Since the previous visit, ***. Stressors include ***.   Regarding psychiatric symptoms, ***. Patient reports the medications are ***. Patient reports the following adverse effects: ***.   Patient reports *** sleep, ***. Patient reports *** appetite, ***.   Patient denies current SI, HI, and AVH. ***  Substance use: ***   Past Psychiatric History:  Diagnoses: Schizophrenia, Substance induced mood disorder, substance induced psychosis,  Previous psychiatrist/therapist: Zane Bach Hospitalizations: 1 per chart review Suicide attempts: denies SIB: denies Hx of violence towards others: denies Current access to guns: denies Hx of trauma/abuse: denies Substance use: cannabis and nicotine   Social History: Living: *** Occupation: *** Relationship: girlfriend Children: *** Support: *** Legal History: ***   Past Medical History:  Past Medical History:  Diagnosis Date   ADHD (attention deficit hyperactivity disorder)    Asthma    Bipolar 1 disorder (HCC)    Depression    Schizophrenia (HCC)    Seizures (HCC)     Past Surgical History:  Procedure Laterality Date   COLONOSCOPY WITH ESOPHAGOGASTRODUODENOSCOPY (EGD)  10/28/2012   MFM:Dpwhoz tiny distal esophageal erosions consistent with mild erosive reflux esophagitis. Hiatal hernia. 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 no h.pylori   NO PAST SURGERIES     none       Family History:  Family History  Problem Relation Age of Onset   Seizures Father    Asthma Other    Seizures Other    Cancer Other        aunt  Social History:  Social History   Socioeconomic History   Marital status: Significant Other    Spouse name: Not on file   Number of children: Not on file   Years of education: Not on file   Highest education  level: Not on file  Occupational History   Not on file  Tobacco Use   Smoking status: Every Day    Current packs/day: 0.50    Average packs/day: 0.5 packs/day for 12.0 years (6.0 ttl pk-yrs)    Types: Cigarettes, Cigars   Smokeless tobacco: Never   Tobacco comments:    5 or 6 cigarettes daily  Vaping Use   Vaping status: Never Used  Substance and Sexual Activity   Alcohol use: Yes    Alcohol/week: 35.0 standard drinks of alcohol    Types: 35 Cans of beer per week   Drug use: Yes    Types: Marijuana   Sexual activity: Yes    Birth control/protection: Condom  Other Topics Concern   Not on file  Social History Narrative   Not on file   Social Drivers of Health   Financial Resource Strain: Unknown (09/27/2021)   Received from [facility]   Overall Financial Resource Strain (CARDIA)    Difficulty of Paying Living Expenses: Patient declined  Food Insecurity: Not on file  Transportation Needs: Not on file  Physical Activity: Not on file  Stress: Unknown (09/27/2021)   Received from [facility] - Occupational Stress Questionnaire    Feeling of Stress : Patient declined  Social Connections: Unknown (02/20/2022)   Received from [facility]   Social Network    Social Network: Not on file    Allergies:  Allergies  Allergen Reactions   Acetaminophen  Hives    Pt reports that he takes tylenol  but it was previously reported as an allergy.   Bee Pollen Hives   Shellfish Allergy Hives    Current Medications: Current Outpatient Medications  Medication Sig Dispense Refill   busPIRone  (BUSPAR ) 15 MG tablet Take 1 tablet (15 mg total) by mouth 2 (two) times daily. (Patient taking differently: Take 15 mg by mouth 3 (three) times daily.) 60 tablet 2   busPIRone  (BUSPAR ) 15 MG tablet Take 1 tablet (15 mg total) by mouth 3 (three) times daily. 9 tablet 0   divalproex  (DEPAKOTE  ER) 500 MG 24 hr tablet Take 500 mg by mouth 2 (two) times  daily.     divalproex  (DEPAKOTE  ER) 500 MG 24 hr tablet Take 1 tablet (500 mg total) by mouth 2 (two) times daily. 6 tablet 0   doxycycline  (VIBRAMYCIN ) 100 MG capsule Take 1 capsule (100 mg total) by mouth 2 (two) times daily. (Patient not taking: Reported on 06/06/2024) 20 capsule 0   haloperidol  (HALDOL ) 20 MG tablet Take 1 tablet (20 mg total) by mouth daily. 30 tablet 2   haloperidol  (HALDOL ) 20 MG tablet Take 1 tablet (20 mg total) by mouth daily. 3 tablet 0   nystatin-triamcinolone (MYCOLOG II) cream Apply to affected area daily 15 g 0   traZODone  (DESYREL ) 150 MG tablet Take 1 tablet (150 mg total) by mouth at bedtime. 30 tablet 2   traZODone  (DESYREL ) 150 MG tablet Take 1 tablet (150 mg total) by mouth at bedtime. 3 tablet 0   No current facility-administered medications for this visit.    Objective:  Psychiatric Specialty Exam: General Appearance: appears at stated age, casually dressed and groomed ***  Behavior: pleasant and cooperative ***  Psychomotor Activity: no psychomotor agitation or retardation noted ***  Eye Contact: fair *** Speech: normal amount, volume and fluency ***   Mood: euthymic *** Affect: congruent, pleasant and interactive ***  Thought Process: linear, goal directed, no circumstantial or tangential thought process noted, no racing thoughts or flight of ideas *** Descriptions of Associations: intact ***  Thought Content Hallucinations: denies AH, VH , does not appear responding to stimuli *** Delusions: no paranoia, delusions of control, grandeur, ideas of reference, thought broadcasting, and magical thinking *** Suicidal Thoughts: denies SI, intention, plan *** Homicidal Thoughts: denies HI, intention, plan ***  Alertness/Orientation: alert and fully oriented ***  Insight: fair*** Judgment: fair***  Memory: intact ***  Executive Functions  Concentration: intact *** Attention Span: fair *** Recall: intact *** Fund of Knowledge: fair  ***  Physical Exam *** General: Pleasant, well-appearing ***. No acute distress. Pulmonary: Normal effort. No wheezing or rales. Skin: No obvious rash or lesions. Neuro: A&Ox3.No focal deficit.  Review of Systems *** No reported symptoms   Metabolic Disorder Labs: No results found for: HGBA1C, MPG No results found for: PROLACTIN No results found for: CHOL, TRIG, HDL, CHOLHDL, VLDL, LDLCALC Lab Results  Component Value Date   TSH 1.124 03/18/2014   TSH 6.017 (H) 11/26/2012    Therapeutic Level Labs: No results found for: LITHIUM Lab Results  Component Value Date   VALPROATE <10 (L) 04/07/2024   VALPROATE <10 (L) 08/26/2021   No results found for: CBMZ  Screenings: AIMS    Flowsheet Row Office Visit from 02/20/2024 in [facility] Office Visit from 02/07/2023 in [facility]  AIMS Total Score 0 0   GAD-7    Flowsheet Row Office Visit from 02/20/2024 in [facility] Office Visit from 02/07/2023 in [facility]  Total GAD-7 Score 0 0   PHQ2-9    Flowsheet Row Office Visit from 02/20/2024 in [facility] Office Visit from 02/07/2023 in [facility]  PHQ-2 Total Score 0 0  PHQ-9 Total Score -- 0   Flowsheet Row ED from 07/31/2024 in [facility] Emergency Department at [facility] ED from 07/30/2024 in [facility] Emergency Department at [facility] ED from 06/06/2024 in [facility] Emergency Department at [facility]  C-SSRS RISK CATEGORY No Risk No Risk No Risk    Collaboration of Care: Collaboration of Care:  Patient/Guardian was advised Release of Information must be obtained prior to any record release in order to collaborate their care with an outside provider. Patient/Guardian was advised if they have not already done so to contact the registration department  to sign all necessary forms in order for us  to release information regarding their care.   Consent: Patient/Guardian gives verbal consent for treatment and assignment of benefits for services provided during this visit. Patient/Guardian expressed understanding and agreed to proceed.   Ismael KATHEE Franco, MD 08/10/2024, 8:03 PM

## 2024-08-13 ENCOUNTER — Encounter (HOSPITAL_COMMUNITY): Payer: Self-pay

## 2024-08-13 ENCOUNTER — Encounter (HOSPITAL_COMMUNITY): Payer: MEDICAID | Admitting: Psychiatry

## 2024-08-13 ENCOUNTER — Telehealth (HOSPITAL_COMMUNITY): Payer: Self-pay | Admitting: Psychiatry

## 2024-08-13 NOTE — Telephone Encounter (Signed)
 Patient did not show up for the appointment. Attempted to call the patient at 11:05 AM and his girlfriend answered and she was unsure if pt was coming to the appointment and the call ended. Another call was attempted and the call was answered and hung up.  Ismael Franco, MD PGY-3 Psychiatry Resident

## 2024-08-22 ENCOUNTER — Other Ambulatory Visit: Payer: Self-pay

## 2024-08-22 ENCOUNTER — Encounter (HOSPITAL_COMMUNITY): Payer: Self-pay

## 2024-08-22 ENCOUNTER — Emergency Department (HOSPITAL_COMMUNITY)
Admission: EM | Admit: 2024-08-22 | Discharge: 2024-08-23 | Disposition: A | Discharge status: Home / Self Care | Payer: MEDICAID | Attending: Emergency Medicine | Admitting: Emergency Medicine

## 2024-08-22 DIAGNOSIS — R109 Unspecified abdominal pain: Secondary | ICD-10-CM | POA: Insufficient documentation

## 2024-08-22 DIAGNOSIS — Z760 Encounter for issue of repeat prescription: Secondary | ICD-10-CM | POA: Diagnosis present

## 2024-08-22 DIAGNOSIS — Z5321 Procedure and treatment not carried out due to patient leaving prior to being seen by health care provider: Secondary | ICD-10-CM | POA: Diagnosis not present

## 2024-08-22 LAB — CBC
HCT: 43.8 % (ref 39.0–52.0)
Hemoglobin: 14.2 g/dL (ref 13.0–17.0)
MCH: 29.2 pg (ref 26.0–34.0)
MCHC: 32.4 g/dL (ref 30.0–36.0)
MCV: 90.1 fL (ref 80.0–100.0)
Platelets: 250 K/uL (ref 150–400)
RBC: 4.86 MIL/uL (ref 4.22–5.81)
RDW: 14.1 % (ref 11.5–15.5)
WBC: 13.4 K/uL — ABNORMAL HIGH (ref 4.0–10.5)
nRBC: 0 % (ref 0.0–0.2)

## 2024-08-22 LAB — COMPREHENSIVE METABOLIC PANEL WITH GFR
ALT: 12 U/L (ref 0–44)
AST: 30 U/L (ref 15–41)
Albumin: 4.4 g/dL (ref 3.5–5.0)
Alkaline Phosphatase: 43 U/L (ref 38–126)
Anion gap: 11 (ref 5–15)
BUN: 6 mg/dL (ref 6–20)
CO2: 26 mmol/L (ref 22–32)
Calcium: 9.6 mg/dL (ref 8.9–10.3)
Chloride: 102 mmol/L (ref 98–111)
Creatinine, Ser: 0.99 mg/dL (ref 0.61–1.24)
GFR, Estimated: >60 mL/min (ref >60)
Glucose, Bld: 73 mg/dL (ref 70–99)
Potassium: 4.1 mmol/L (ref 3.5–5.1)
Sodium: 138 mmol/L (ref 135–145)
Total Bilirubin: 0.7 mg/dL (ref 0.0–1.2)
Total Protein: 6.6 g/dL (ref 6.5–8.1)

## 2024-08-22 LAB — LIPASE, BLOOD: Lipase: 31 U/L (ref 11–51)

## 2024-08-22 NOTE — ED Provider Notes (Incomplete)
  [place] EMERGENCY DEPARTMENT AT [facility] Provider Note   CSN: [id number] Arrival date & time: 08/22/24  2221     History Chief Complaint  Patient presents with  . Abdominal Pain    HPI [name]  Patient's recorded medical, surgical, social, medication list and allergies were reviewed in the Snapshot window as part of the initial history.   Review of Systems   Review of Systems  Physical Exam Updated Vital Signs BP (!) 127/93 (BP Location: Right Arm)   Pulse 70   Temp 97.8 F (36.6 C) (Oral)   Resp 20   SpO2 99%  Physical Exam   ED Course/ Medical Decision Making/ A&P    Procedures Procedures   Medications Ordered in ED Medications - No data to display  Medical Decision Making:   ZACARY BAUER is a 38 y.o. male who presented to the ED today with *** detailed above.    {crccomplexity:27900} Complete initial physical exam performed, notably the patient  was ***.    Reviewed and confirmed nursing documentation for past medical history, family history, social history.    Initial Assessment:   With the patient's presentation of ***, most likely diagnosis is ***. Other diagnoses were considered including (but not limited to) ***. These are considered less likely due to history of present illness and physical exam findings.   {crccopa:27899}  Initial Plan:  ***  ***Screening labs including CBC and Metabolic panel to evaluate for infectious or metabolic etiology of disease.  ***Urinalysis with reflex culture ordered to evaluate for UTI or relevant urologic/nephrologic pathology.  ***CXR to evaluate for structural/infectious intrathoracic pathology.  {crccardiactesting:32591::EKG to evaluate for cardiac pathology} Objective evaluation as below reviewed   Initial Study Results:   Laboratory  All laboratory results reviewed without evidence of clinically relevant pathology.   ***Exceptions include: ***   ***EKG EKG was reviewed  independently. Rate, rhythm, axis, intervals all examined and without medically relevant abnormality. ST segments without concerns for elevations.    Radiology:  All images reviewed independently. ***Agree with radiology report at this time.   No results found.    Consults: Case discussed with ***.   Reassessment and Plan:   ***    ***  Clinical Impression: No diagnosis found.   Data Unavailable   Final Clinical Impression(s) / ED Diagnoses Final diagnoses:  None    Rx / DC Orders ED Discharge Orders     None

## 2024-08-22 NOTE — ED Provider Notes (Signed)
  [place] EMERGENCY DEPARTMENT AT [facility] Provider Note   CSN: [id number] Arrival date & time: 08/22/24  2221     History Chief Complaint  Patient presents with   Abdominal Pain    HPI David Blankenship is a 38 year old male who initially presented to triage with abdominal pain. I was called to bedside as he was yelling and screaming.  Per bedside nurse he had been demanding pain medication, food and water  the bedside nurse had stated that he had to wait until he was evaluated.  He currently started grabbing on the counter and throwing them up the wall.  Patient's recorded medical, surgical, social, medication list and allergies were reviewed in the Snapshot window as part of the initial history.   Review of Systems   Review of Systems  Unable to perform ROS: Psychiatric disorder    Physical Exam Updated Vital Signs BP (!) 127/93 (BP Location: Right Arm)   Pulse 70   Temp 97.8 F (36.6 C) (Oral)   Resp 20   SpO2 99%  Physical Exam Vitals and nursing note reviewed.  Constitutional:      General: He is not in acute distress.    Appearance: He is well-developed.  HENT:     Head: Normocephalic and atraumatic.  Eyes:     Conjunctiva/sclera: Conjunctivae normal.  Cardiovascular:     Rate and Rhythm: Normal rate and regular rhythm.  Pulmonary:     Effort: Pulmonary effort is normal. No respiratory distress.  Abdominal:     General: Abdomen is flat. There is no distension.  Musculoskeletal:        General: No swelling or deformity.  Neurological:     Mental Status: He is alert and oriented to person, place, and time. Mental status is at baseline.      ED Course/ Medical Decision Making/ A&P    Procedures Procedures   Medications Ordered in ED Medications - No data to display  Medical Decision Making:   Patient is refusing to allow any further evaluation.  He is aggressive, declining any further medical care or evaluation.  Refusing repeat  vitals, further evaluation in the emergency room unless he gets pain meds He is attempting to destroy things in the room. Not in any acute distress. Patient very clearly demonstrating tertiary gain behavior.  Frequently evaluated. Does not appear to be any acute distress.  His aggressive behavior is very clearly limiting our ability to further evaluate him.  This was expressed to him directly and he stated he did not care. Patient stable for discharge at this time.  Lab work performed in triage was reviewed without evidence of acute medical pathology.  Clinical Impression:  1. Abdominal pain, unspecified abdominal location      Discharge   Final Clinical Impression(s) / ED Diagnoses Final diagnoses:  Abdominal pain, unspecified abdominal location    Rx / DC Orders ED Discharge Orders     None         Jerral Meth, MD 08/23/24 [phone]

## 2024-08-22 NOTE — ED Triage Notes (Signed)
 Pt reports abdominal pain that started today and also requesting refills of prescriptions.

## 2024-08-23 ENCOUNTER — Emergency Department (HOSPITAL_COMMUNITY)
Admission: EM | Admit: 2024-08-23 | Discharge: 2024-08-23 | Discharge status: Against Medical Advice | Payer: MEDICAID | Source: Home / Self Care

## 2024-08-23 ENCOUNTER — Encounter (HOSPITAL_COMMUNITY): Payer: Self-pay

## 2024-08-23 ENCOUNTER — Other Ambulatory Visit: Payer: Self-pay

## 2024-08-23 DIAGNOSIS — Z5321 Procedure and treatment not carried out due to patient leaving prior to being seen by health care provider: Secondary | ICD-10-CM | POA: Insufficient documentation

## 2024-08-23 DIAGNOSIS — Z760 Encounter for issue of repeat prescription: Secondary | ICD-10-CM | POA: Insufficient documentation

## 2024-08-23 NOTE — ED Triage Notes (Signed)
 Pt presents requesting medication refill. Pt evaluated at this facility earlier.

## 2024-08-25 ENCOUNTER — Encounter (HOSPITAL_COMMUNITY): Payer: Self-pay

## 2024-08-25 ENCOUNTER — Other Ambulatory Visit: Payer: Self-pay

## 2024-08-25 ENCOUNTER — Emergency Department (HOSPITAL_COMMUNITY)
Admission: EM | Admit: 2024-08-25 | Discharge: 2024-08-25 | Disposition: A | Discharge status: Home / Self Care | Payer: MEDICAID | Attending: Emergency Medicine | Admitting: Emergency Medicine

## 2024-08-25 DIAGNOSIS — Z711 Person with feared health complaint in whom no diagnosis is made: Secondary | ICD-10-CM

## 2024-08-25 DIAGNOSIS — Z008 Encounter for other general examination: Secondary | ICD-10-CM | POA: Diagnosis present

## 2024-08-25 NOTE — ED Provider Notes (Signed)
 [place] EMERGENCY DEPARTMENT AT [facility] Provider Note   CSN: [id number] Arrival date & time: 08/25/24  0510     Patient presents with: No complaints   LORING LISKEY is a 38 y.o. male.   HPI  This patient is a 38 year old male present emergency room today with no complaints.  He states that he came to emergency room to be evaluated.  Of request chest pain headache nausea vomiting or any other areas of pain.       Prior to Admission medications   Medication Sig Start Date End Date Taking? Authorizing Provider  busPIRone  (BUSPAR ) 15 MG tablet Take 1 tablet (15 mg total) by mouth 2 (two) times daily. Patient taking differently: Take 15 mg by mouth 3 (three) times daily. 03/31/24   Lynnette Barter, MD  busPIRone  (BUSPAR ) 15 MG tablet Take 1 tablet (15 mg total) by mouth 3 (three) times daily. 07/31/24   Keith, Kayla N, PA-C  divalproex  (DEPAKOTE  ER) 500 MG 24 hr tablet Take 500 mg by mouth 2 (two) times daily. 05/28/24   [provider]  divalproex  (DEPAKOTE  ER) 500 MG 24 hr tablet Take 1 tablet (500 mg total) by mouth 2 (two) times daily. 07/31/24   Keith, Kayla N, PA-C  doxycycline  (VIBRAMYCIN ) 100 MG capsule Take 1 capsule (100 mg total) by mouth 2 (two) times daily. Patient not taking: Reported on 06/06/2024 01/25/24   Horton, Charmaine FALCON, MD  haloperidol  (HALDOL ) 20 MG tablet Take 1 tablet (20 mg total) by mouth daily. 03/31/24 06/29/24  Lynnette Barter, MD  haloperidol  (HALDOL ) 20 MG tablet Take 1 tablet (20 mg total) by mouth daily. 07/31/24   Francis Ileana SAILOR, PA-C  nystatin-triamcinolone [facility] II) cream Apply to affected area daily 07/30/24   Bernis Ernst, PA-C  traZODone  (DESYREL ) 150 MG tablet Take 1 tablet (150 mg total) by mouth at bedtime. 03/31/24 06/29/24  Lynnette Barter, MD  traZODone  (DESYREL ) 150 MG tablet Take 1 tablet (150 mg total) by mouth at bedtime. 07/31/24   Francis Ileana SAILOR, PA-C    Allergies: Acetaminophen , Bee pollen, and Shellfish allergy     Review of Systems  Updated Vital Signs BP (!) 135/101 (BP Location: Left Arm)   Pulse 76   Temp 97.9 F (36.6 C) (Oral)   Resp 17   SpO2 100%   Physical Exam Vitals and nursing note reviewed.  Constitutional:      General: He is not in acute distress. HENT:     Head: Normocephalic and atraumatic.     Nose: Nose normal.  Eyes:     General: No scleral icterus. Cardiovascular:     Rate and Rhythm: Normal rate and regular rhythm.     Pulses: Normal pulses.     Heart sounds: Normal heart sounds.  Pulmonary:     Effort: Pulmonary effort is normal. No respiratory distress.     Breath sounds: No wheezing.  Abdominal:     Palpations: Abdomen is soft.     Tenderness: There is no abdominal tenderness.  Musculoskeletal:     Cervical back: Normal range of motion.     Right lower leg: No edema.     Left lower leg: No edema.  Skin:    General: Skin is warm and dry.     Capillary Refill: Capillary refill takes less than 2 seconds.  Neurological:     Mental Status: He is alert. Mental status is at baseline.  Psychiatric:        Mood  and Affect: Mood normal.        Behavior: Behavior normal.     (all labs ordered are listed, but only abnormal results are displayed) Labs Reviewed - No data to display  EKG: None  Radiology: No results found.   Procedures   Medications Ordered in the ED - No data to display                                  Medical Decision Making  This patient is a 38 year old male present emergency room today with no complaints.  He states that he came to emergency room to be evaluated.  Of request chest pain headache nausea vomiting or any other areas of pain.   Exam unremarkable.  Well-appearing.  Normal vital signs mild hypertension  Regular follow-up with primary care    Final diagnoses:  None    ED Discharge Orders     None          Neldon Hamp RAMAN, [place] 08/25/24 1635    Randol Simmonds, MD 08/26/24 0800

## 2024-08-25 NOTE — ED Triage Notes (Signed)
 Was called by a citizen, he knocked on someone's door and said he was lost and wanted to go home. He is a well known psych patient to [facility] but not mentioning a any complaints of behavioral/SI/HI. He is otherwise stable and was sleeping when arriving on the physicist, medical. He is cooperative at this time.   Medic vitals  118/ palp 80hr 97%ra 18rr

## 2024-08-25 NOTE — Discharge Instructions (Signed)
 Return to the emergency department if you have any new or concerning symptoms. Rest, hydrate, follow-up with your primary care doctor.

## 2024-09-04 ENCOUNTER — Other Ambulatory Visit: Payer: Self-pay

## 2024-09-04 ENCOUNTER — Emergency Department (HOSPITAL_COMMUNITY)
Admission: EM | Admit: 2024-09-04 | Discharge: 2024-09-09 | Attending: Emergency Medicine | Admitting: Emergency Medicine

## 2024-09-04 DIAGNOSIS — L97519 Non-pressure chronic ulcer of other part of right foot with unspecified severity: Secondary | ICD-10-CM | POA: Diagnosis not present

## 2024-09-04 DIAGNOSIS — J45909 Unspecified asthma, uncomplicated: Secondary | ICD-10-CM | POA: Insufficient documentation

## 2024-09-04 DIAGNOSIS — F29 Unspecified psychosis not due to a substance or known physiological condition: Secondary | ICD-10-CM | POA: Diagnosis present

## 2024-09-04 DIAGNOSIS — F259 Schizoaffective disorder, unspecified: Secondary | ICD-10-CM | POA: Diagnosis not present

## 2024-09-04 DIAGNOSIS — E860 Dehydration: Secondary | ICD-10-CM | POA: Diagnosis not present

## 2024-09-04 DIAGNOSIS — L97529 Non-pressure chronic ulcer of other part of left foot with unspecified severity: Secondary | ICD-10-CM | POA: Insufficient documentation

## 2024-09-04 DIAGNOSIS — R440 Auditory hallucinations: Secondary | ICD-10-CM | POA: Diagnosis present

## 2024-09-04 LAB — BASIC METABOLIC PANEL WITH GFR
Anion gap: 15 (ref 5–15)
BUN: 39 mg/dL — ABNORMAL HIGH (ref 6–20)
CO2: 20 mmol/L — ABNORMAL LOW (ref 22–32)
Calcium: 8.5 mg/dL — ABNORMAL LOW (ref 8.9–10.3)
Chloride: 103 mmol/L (ref 98–111)
Creatinine, Ser: 1.14 mg/dL (ref 0.61–1.24)
GFR, Estimated: >60 mL/min (ref >60)
Glucose, Bld: 142 mg/dL — ABNORMAL HIGH (ref 70–99)
Potassium: 4.1 mmol/L (ref 3.5–5.1)
Sodium: 138 mmol/L (ref 135–145)

## 2024-09-04 LAB — CBG MONITORING, ED: Glucose-Capillary: 143 mg/dL — ABNORMAL HIGH (ref 70–99)

## 2024-09-04 MED ORDER — TRAZODONE HCL 50 MG PO TABS
150.0000 mg | ORAL_TABLET | Freq: Every day | ORAL | Status: DC
  Administered 2024-09-05 – 2024-09-08 (×4): 150 mg via ORAL
  Filled 2024-09-04 (×4): qty 1

## 2024-09-04 MED ORDER — HALOPERIDOL 5 MG PO TABS
20.0000 mg | ORAL_TABLET | Freq: Every day | ORAL | Status: DC
  Administered 2024-09-05 – 2024-09-09 (×5): 20 mg via ORAL
  Filled 2024-09-04 (×5): qty 4

## 2024-09-04 MED ORDER — DIVALPROEX SODIUM ER 500 MG PO TB24
500.0000 mg | ORAL_TABLET | Freq: Two times a day (BID) | ORAL | Status: DC
Start: 2024-09-04 — End: 2024-09-07
  Administered 2024-09-05 – 2024-09-07 (×5): 500 mg via ORAL
  Filled 2024-09-04 (×5): qty 1

## 2024-09-04 MED ORDER — LACTATED RINGERS IV BOLUS
1000.0000 mL | Freq: Once | INTRAVENOUS | Status: AC
  Administered 2024-09-04: 1000 mL via INTRAVENOUS

## 2024-09-04 MED ORDER — DIPHENHYDRAMINE HCL 50 MG/ML IJ SOLN
50.0000 mg | Freq: Once | INTRAMUSCULAR | Status: AC
  Administered 2024-09-04: 50 mg via INTRAMUSCULAR
  Filled 2024-09-04: qty 1

## 2024-09-04 MED ORDER — BUSPIRONE HCL 10 MG PO TABS
15.0000 mg | ORAL_TABLET | Freq: Three times a day (TID) | ORAL | Status: DC
  Administered 2024-09-05 – 2024-09-09 (×12): 15 mg via ORAL
  Filled 2024-09-04 (×12): qty 2

## 2024-09-04 MED ORDER — HALOPERIDOL LACTATE 5 MG/ML IJ SOLN
10.0000 mg | Freq: Once | INTRAMUSCULAR | Status: AC
  Administered 2024-09-04: 10 mg via INTRAMUSCULAR
  Filled 2024-09-04: qty 2

## 2024-09-04 MED ORDER — MIDAZOLAM HCL (PF) 10 MG/2ML IJ SOLN
5.0000 mg | Freq: Once | INTRAMUSCULAR | Status: AC
  Administered 2024-09-04: 5 mg via INTRAMUSCULAR
  Filled 2024-09-04: qty 2

## 2024-09-04 NOTE — ED Triage Notes (Signed)
 [facility] from [facility] via [facility] with reports of dehydration and hallucinations.

## 2024-09-04 NOTE — ED Notes (Signed)
 Pt did not allow a oral/axillary temp

## 2024-09-04 NOTE — ED Notes (Signed)
 Pt is awake and starting to become agitated again/ pt follows commands but is starting to become verbally aggressive/ provider made aware/ psych to see soon

## 2024-09-04 NOTE — ED Notes (Signed)
 Pt continues to refuse treatment/ pt given water  and sandwich for now per MD request

## 2024-09-04 NOTE — BH Assessment (Signed)
 David Blankenship from [facility] messaged  the secure chat the assessment time (2145) however there was no response. Clinician messaged nursing staff in secure chat if the pt was able to engage in the assessment at 2145 but no one responded.   Per Ileana with [facility] That time is no longer available, once we hear back from staff we can get a new time set up.    [name], MS, [facility], [facility] Triage Specialist [phone]

## 2024-09-04 NOTE — BH Assessment (Signed)
 Per Leonor Dollar, RN pt was given Versed  and is sleeping.    [name], MS, [facility], [facility] Triage Specialist [phone]

## 2024-09-04 NOTE — ED Notes (Signed)
 Pt was calm and cooperative during medication injection

## 2024-09-04 NOTE — ED Notes (Signed)
Pt sleeping/ resp even and regular  

## 2024-09-04 NOTE — ED Notes (Signed)
 Pt is sleeping/ resp even and regular/ IV placed/ fluids started/ provider made aware/

## 2024-09-04 NOTE — BH Assessment (Signed)
 Clinician spoke to [facility] with [facility] to complete pt's TTS assessment. Clinician provided pt's name, MRN, location, gender and room number. Secure message completed.    [facility] coordinator to update secure chat when assessment time and provider are assigned.  [name], MS, [facility], [facility] Triage Specialist [phone]

## 2024-09-04 NOTE — ED Provider Notes (Signed)
 [place] EMERGENCY DEPARTMENT AT [facility] Provider Note  MDM   HPI/ROS:  David Blankenship is a 38 y.o. male with a PMH of MDD, schizophrenia, BPD 1, ADHD, asthma, seizures who presents to the ED for auditory hallucinations, concerns for mania, as well as concerns for dehydration.  Patient states that he has been out of his medication for approximately 8 days and has had increasing dryness and chapped of his lips as well as his feet.  Further history is difficult to obtain given patient is nonlinear in his speech.  Physical exam is notable for: - Oral mucosa dry, significantly chapped lips, -- Bilateral feet with skin tears, chafing -- Responding to internal stimuli, hallucinations, delusions, pressured and rapid speech  On my initial evaluation, patient is:  -Vital signs stable. Patient afebrile, hemodynamically stable, and non-toxic appearing.   This patient's current presentation, including their history and physical exam, there is concern for acute psychiatric decompensation/acute mania versus psychosis.  Given his significant limp chapping, also concern for dehydration, electrolyte derangement therefore will obtain basic lab work and rehydrate both orally and with IV medication.  Patient was agitated and nonredirectable verbally, was refusing all lab work, at this time do not feel the patient has capacity to refuse medical management or diagnoses therefore was given 10 mg Haldol  IM without resolution.  Given this, he then received 5 mg of Versed  and 50 mg Benadryl  IM with resolution of patient's agitation.  BMP without any electrolyte abnormalities.  Evidence of infection.  No electrolyte signs that are concerning for dehydration.  Patient given 1 L LR, comparable Aquaphor placed on lips and feet.  Patient with repeated picking and biting, believe that this is self-induced.    Given his psychiatric acute decompensation with concern for acute psychosis worsening mania, will  place in psych observation/hold.  TSS and THC both consulted, psychiatry defer to [facility].  At this  time, pending nurse evaluation for further disposition.  Interpretations, interventions, and the patient's course of care are documented below.    Clinical Course as of 09/04/24 2343  Fri Sep 04, 2024  2105 EKG sinus rhythm at a rate of 98, QT/QTc 390/478, no ST elevations or depressions concerning for acute ischemia [SA]  2145 Lab work is reassuring, patient received 1 L LR, will need evaluation by [facility].  [SA]  2147 Home meds and diet in [SA]  2342 [facility] to see and evaluate for decompensated schizophrenia, c/f acute mania/psychosis [SA]    Clinical Course User Index [SA] Billy Pal, MD     Disposition:  Pending at the time of sign out  Clinical Impression: No diagnosis found.   Clinical Complexity A medically appropriate history, review of systems, and physical exam was performed.  My independent interpretations of EKG, labs, and radiology are documented in the ED course above.   If decision rules were used in this patient's evaluation, they are listed below.   Click here for ABCD2, HEART and other calculatorsREFRESH Note before signing   Patient's presentation is most consistent with acute complicated illness / injury requiring diagnostic workup.  Medical Decision Making Amount and/or Complexity of Data Reviewed Labs: ordered.  Risk Prescription drug management.    HPI/ROS      See MDM section for pertinent HPI and ROS. A complete ROS was performed with pertinent positives/negatives noted above.   Past Medical History:  Diagnosis Date  . ADHD (attention deficit hyperactivity disorder)   . Asthma   . Bipolar 1 disorder (HCC)   .  Depression   . Schizophrenia (HCC)   . Seizures (HCC)     Past Surgical History:  Procedure Laterality Date  . COLONOSCOPY WITH ESOPHAGOGASTRODUODENOSCOPY (EGD)  10/28/2012   MFM:Dpwhoz tiny distal esophageal erosions consistent with  mild erosive reflux esophagitis. Hiatal hernia. 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 no h.pylori  . NO PAST SURGERIES    . none        Physical Exam   Vitals:   09/04/24 1737  BP: (!) 129/103  Pulse: 78  Resp: 14  SpO2: 100%    Physical Exam Vitals and nursing note reviewed.  Constitutional:      General: He is not in acute distress.    Appearance: He is well-developed.  HENT:     Head: Normocephalic and atraumatic.     Mouth/Throat:     Mouth: Mucous membranes are dry.     Comments: - Oral mucosa dry, significantly chapped lips, Eyes:     Conjunctiva/sclera: Conjunctivae normal.  Cardiovascular:     Rate and Rhythm: Normal rate and regular rhythm.     Heart sounds: No murmur heard. Pulmonary:     Effort: Pulmonary effort is normal. No respiratory distress.     Breath sounds: Normal breath sounds.  Abdominal:     Palpations: Abdomen is soft.     Tenderness: There is no abdominal tenderness.  Musculoskeletal:        General: No swelling.     Cervical back: Neck supple.  Skin:    General: Skin is dry.     Capillary Refill: Capillary refill takes less than 2 seconds.     Comments: -- Bilateral feet with skin tears, chafing   Neurological:     Mental Status: He is alert.  Psychiatric:     Comments: -- Responding to internal stimuli, hallucinations, delusions, pressured and rapid speech       Procedures   If procedures were preformed on this patient, they are listed below:  Procedures  Please note that this documentation was produced with the assistance of voice-to-text technology and may contain errors.     Billy Pal, MD 09/04/24 7691    Garrick Charleston, MD 09/04/24 [phone]

## 2024-09-05 ENCOUNTER — Encounter (HOSPITAL_COMMUNITY): Payer: Self-pay

## 2024-09-05 LAB — URINALYSIS, ROUTINE W REFLEX MICROSCOPIC
Bilirubin Urine: NEGATIVE
Glucose, UA: NEGATIVE mg/dL
Hgb urine dipstick: NEGATIVE
Ketones, ur: 20 mg/dL — AB
Nitrite: NEGATIVE
Protein, ur: NEGATIVE mg/dL
Specific Gravity, Urine: 1.028 (ref 1.005–1.030)
pH: 5 (ref 5.0–8.0)

## 2024-09-05 LAB — CBC WITH DIFFERENTIAL/PLATELET
Abs Immature Granulocytes: 0.03 K/uL (ref 0.00–0.07)
Basophils Absolute: 0 K/uL (ref 0.0–0.1)
Basophils Relative: 0 %
Eosinophils Absolute: 0 K/uL (ref 0.0–0.5)
Eosinophils Relative: 0 %
HCT: 38.5 % — ABNORMAL LOW (ref 39.0–52.0)
Hemoglobin: 12.9 g/dL — ABNORMAL LOW (ref 13.0–17.0)
Immature Granulocytes: 0 %
Lymphocytes Relative: 23 %
Lymphs Abs: 1.8 K/uL (ref 0.7–4.0)
MCH: 30.3 pg (ref 26.0–34.0)
MCHC: 33.5 g/dL (ref 30.0–36.0)
MCV: 90.4 fL (ref 80.0–100.0)
Monocytes Absolute: 1 K/uL (ref 0.1–1.0)
Monocytes Relative: 13 %
Neutro Abs: 5.1 K/uL (ref 1.7–7.7)
Neutrophils Relative %: 64 %
Platelets: 233 K/uL (ref 150–400)
RBC: 4.26 MIL/uL (ref 4.22–5.81)
RDW: 14.3 % (ref 11.5–15.5)
WBC: 8 K/uL (ref 4.0–10.5)
nRBC: 0 % (ref 0.0–0.2)

## 2024-09-05 LAB — RAPID URINE DRUG SCREEN, HOSP PERFORMED
Amphetamines: NOT DETECTED
Barbiturates: NOT DETECTED
Benzodiazepines: POSITIVE — AB
Cocaine: NOT DETECTED
Opiates: NOT DETECTED
Tetrahydrocannabinol: POSITIVE — AB

## 2024-09-05 MED ORDER — STERILE WATER FOR INJECTION IJ SOLN
INTRAMUSCULAR | Status: AC
  Filled 2024-09-05: qty 10

## 2024-09-05 MED ORDER — DOXYCYCLINE HYCLATE 100 MG PO TABS
100.0000 mg | ORAL_TABLET | Freq: Two times a day (BID) | ORAL | Status: DC
Start: 2024-09-05 — End: 2024-09-10
  Administered 2024-09-05 – 2024-09-09 (×9): 100 mg via ORAL
  Filled 2024-09-05 (×9): qty 1

## 2024-09-05 MED ORDER — MIDAZOLAM HCL (PF) 2 MG/2ML IJ SOLN: 4.0000 mg | Freq: Once | INTRAMUSCULAR | Status: DC

## 2024-09-05 MED ORDER — MIDAZOLAM HCL (PF) 10 MG/2ML IJ SOLN
4.0000 mg | Freq: Once | INTRAMUSCULAR | Status: AC
  Administered 2024-09-05: 4 mg via INTRAMUSCULAR
  Filled 2024-09-05: qty 2

## 2024-09-05 MED ORDER — ZIPRASIDONE MESYLATE 20 MG IM SOLR
20.0000 mg | Freq: Once | INTRAMUSCULAR | Status: AC
  Administered 2024-09-05: 20 mg via INTRAMUSCULAR
  Filled 2024-09-05: qty 20

## 2024-09-05 NOTE — ED Notes (Signed)
 Pt is awake, agitated, yelling out, responding to internal stimuli and shaking bed rails and shackles constantly. Officers remain at bedside with patient.

## 2024-09-05 NOTE — ED Notes (Signed)
 Pt continues to yell out incoherent speech/ pt still follows commands

## 2024-09-05 NOTE — Consult Note (Signed)
 WOC Nurse Consult Note: Reason for Consult: foot wounds  Wound type: unclear etiology; may be traumatic, blisters?  No  other history noted Pressure Injury POA: NA Measurement: see nursing flow sheets Wound bed: Left lateral heel, serous crust Right lateral heel, unroofed blister pink and yellow, no acute s/s of infection Drainage (amount, consistency, odor) see nursing flow sheets Periwound:intact  Dressing procedure/placement/frequency: Cleanse both wounds with saline, cover with single layer of xeroform, top with dry dressing, wrap with kerlix. Change daily    Re consult if needed, will not follow at this time. Thanks  Ajooni [employer]am [employer], RN,CWOCN, CNS, [employer] [phone]

## 2024-09-05 NOTE — Progress Notes (Signed)
 Patient has been denied by [facility] [place] due to no appropriate beds available. Patient meets BH inpatient criteria per Elveria Batter, NP. Patient has been faxed out to the following facilities:    [facility]  [address] [place] [address] [phone]  [facility]  [address] [place] [address] [phone]  [facility]  [address], [place] [address] [phone]  [facility]  [address] [place], [place] [address] [phone]  [facility] Patient Placement  [facility], [place] [phone]  [facility]  [address] [place] [address] [phone]  [facility]  [address] [place] [address] [phone]  [facility]  [address] [place] [address] [phone]  [facility]  [address] [place] [address] [phone]  [facility]  [address] [place] [address] [phone]  [facility]  [address] [place] [address] [phone]  [facility]  [address], [place] [address] [phone]  [facility]  [address], [place] [address] [phone]  [facility]  [address] [place] [address] [phone]  [facility]  [address] [place] [address] [phone]  [facility] [address]  [place] [address] [phone]  [facility]  [address] [facility] [place] [address] [phone]   Bunnie Gallop, MSW,  LCSW-A  9:46 AM 09/05/2024

## 2024-09-05 NOTE — ED Notes (Signed)
 Vaseline placed on patient's lips d/t lips dry and cracked. Wounds to bilateral feet cleansed and dressed with xeroform gauze, nonadherent pad, and wrapped with kerlix. Officers remain at bedside.

## 2024-09-05 NOTE — ED Notes (Signed)
 Pt urinated on himself. Did not tell officers at bedside or staff that he needed to void. Pt cleaned up, changed into gown, linens changed. Condom cath put on patient.

## 2024-09-05 NOTE — ED Notes (Signed)
 Pt transferred to rm 49, awaiting psych eval. Per previous nurse pt is very vocal and yells but does not yell at you but the hallucinations. Pt in police custody and handcuffed to bed.

## 2024-09-05 NOTE — ED Notes (Signed)
 Pt agreeable to take scheduled PO meds. After med administration, pt noted to have pills in cheeks and under tongue. Pt eventually swallowed meds with no residual in mouth after multiple reminders and prompting to swallow meds. Note made in [facility] for future medication administration.

## 2024-09-05 NOTE — ED Notes (Signed)
 Per Dr. Jerrol, wound consult placed but wound care unable to see patient today. Verbal order to cleanse and dress wound with xeroform gauze and nonadherent pad until wound care can come assess wounds.

## 2024-09-05 NOTE — ED Notes (Signed)
 Pt singing, shaking side rails with cuffs. Forensic restraints continuously on.

## 2024-09-05 NOTE — ED Provider Notes (Signed)
 Emergency Medicine Observation Re-evaluation Note  GAYLE COLLARD is a 38 y.o. male, seen on rounds today.  Pt initially presented to the ED for complaints of Dehydration Currently, the patient is agitated and in restraints.  Physical Exam  BP 121/79 (BP Location: Left Arm)   Pulse 95   Temp 98.3 F (36.8 C) (Axillary)   Resp 18   SpO2 100%  Physical Exam General: agitated, responding to internal stimuli Cardiac: well perfused Lungs: evan and unlabored Psych: appears psychotic, agitated, requiring restraints MSK: Wounds to bilateral feet noted          ED Course / MDM  EKG:   I have reviewed the labs performed to date as well as medications administered while in observation.  Recent changes in the last 24 hours include seen by psychiatry, recommendations included inpatient.  Plan  Current plan is for inpatient psychiatric admission. Doxycycline  started for foot wounds, wound consult placed.    Jerrol Agent, MD 09/05/24 1245

## 2024-09-05 NOTE — Consult Note (Signed)
 [facility] Psychiatric Consult Initial  Patient Name: .David Blankenship  MRN: [id number]  DOB: 28-Jun-1986  Consult Order details:  Orders (From admission, onward)     Start     Ordered   09/04/24 1815  CONSULT TO CALL ACT TEAM       Comments: C/f acute decomp schizophrenia and psychosis vs mania  Ordering Provider: Billy Pal, MD  Provider:  (Not yet assigned)  Question:  Reason for Consult?  Answer:  Psych consult   09/04/24 1816             Mode of Visit: In person    Psychiatry Consult Evaluation  Service Date: September 05, 2024 LOS:  LOS: 0 days  Chief Complaint initially presented for with [facility] department from jail with reports of dehydration and hallucinations  Primary Psychiatric Diagnoses  Psychosis 2.  Schizoaffective disorder versus schizophrenia   Assessment  David Blankenship is a 38 y.o. male admitted: Presented to the EDfor 09/04/2024  5:27 PM initially presented for with [facility] department from jail with reports of dehydration and hallucinations.  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   His current presentation of psychosis with disorganized thought process and auditory hallucinations is most consistent with psychosis. He meets criteria for inpatient psychiatric admission based on current symptomology.  Patient states he has been off his medications for 8 days.  He is unable to state who prescribes medications.  Patient initially presented to the emergency department from jail with [facility] deputies with reported dehydration and hallucinations.  While in the ED, he has exhibited episodes of significant agitation requiring a ministration of emergency medications.  He is currently in 4-point forensic restraints with  to [facility] deputies at bedside as he remains in custody.  09/05/2024 On assessment, patient appears disheveled with minimal eye contact.  Speech is pressured, garbled, and difficult to understand at times.  He is alert to self and place and reports he was brought to the hospital because he has not taken his psychiatric medications in 8 days.  Patient is restless, frequently shifting in the bed, and intermittently pulling at restraints.  He currently denies suicidal ideation and homicidal ideation.  He denies auditory/visual hallucinations; however, he is observed responding to internal/external stimuli.  At 1 point, he place his hands on his lips and stated, shush cannot you hear that.  Though he was unable to elaborate further regarding what he was hearing.  Assessment is limited due to patient's minimal participation in disorganized thought process.  He appears acutely psychotic and unable to provide a reliable history at this time.  Based on current presentation, patient would benefit from inpatient psychiatric admission for safety, medication reinitiation, and stabilization.   Please see plan below for detailed recommendations.   Diagnoses:  Active Hospital problems: Principal Problem:   Psychosis (HCC)    Plan   ## Psychiatric Medication Recommendations:  Continue - Depakote  500 mg twice daily - Haldol  20 mg daily - BuSpar  15 mg 3 times daily - Trazodone  150 mg nightly  ## Medical Decision Making Capacity: Not specifically addressed in this encounter  ## Further Work-up:  -- Obtain EKG  -- most recent EKG on 04/10/2024 had QtC of 430 -- Pertinent labwork reviewed earlier this admission includes: CBC, CMP, U/A, UDS positive for marijuana and  benzodiazepines   ## Disposition:--Patient currently voluntary however he is in custody of [facility] department.  He is recommended for inpatient psychiatric admission.  Social work notified and is seeking  placement  ## Behavioral / Environmental: -Difficult Patient (SELECT OPTIONS FROM BELOW), To minimize splitting of staff, assign one staff person to communicate all information from the team when feasible., or Utilize compassion and acknowledge the patient's experiences while setting clear and realistic expectations for care.    ## Safety and Observation Level:  - Based on my clinical evaluation, I estimate the patient to be at high risk of self harm in the current setting. - At this time, we recommend  1:1 Observation. This decision is based on my review of the chart including patient's history and current presentation, interview of the patient, mental status examination, and consideration of suicide risk including evaluating suicidal ideation, plan, intent, suicidal or self-harm behaviors, risk factors, and protective factors. This judgment is based on our ability to directly address suicide risk, implement suicide prevention strategies, and develop a safety plan while the patient is in the clinical setting. Please contact our team if there is a concern that risk level has changed.  CSSR Risk Category:   Suicide Risk Assessment: Patient has following modifiable risk factors for suicide: recklessness, medication noncompliance, active mental illness (to encompass adhd, tbi, mania, psychosis, trauma reaction), current symptoms: anxiety/panic, insomnia, impulsivity, anhedonia, hopelessness, and recent psychiatric hospitalization, which we are addressing by recommending inpatient psychiatric admission. Patient has following non-modifiable or demographic risk factors for suicide: male gender and psychiatric hospitalization Patient has the following protective factors against suicide: Access to outpatient mental health care  Thank you for this consult request. Recommendations have been communicated to the primary team.  We will continue to follow while awaiting psychiatric bed placement at this time.    David VEAR Batter, NP       History of Present Illness  Relevant Aspects of Hospital ED Course:  Admitted on 09/04/2024 nitially presented for with [facility] department from jail with reports of dehydration and hallucinations.  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   Patient Report:  I been off my meds for 8 days  09/04/2024 Olla Bone MD, emergency room provider, Roey Coopman is a 38 y.o. male with a PMH of MDD, schizophrenia, BPD 1, ADHD, asthma, seizures who presents to the ED for auditory hallucinations, concerns for mania, as well as concerns for dehydration.  Patient states that he has been out of his medication for approximately 8 days and has had increasing dryness and chapped of his lips as well as his feet.  Further history is difficult to obtain given patient is nonlinear in his speech.   Physical exam is notable for: - Oral mucosa dry, significantly chapped lips, -- Bilateral feet with skin tears, chafing -- Responding to internal stimuli, hallucinations, delusions, pressured and rapid speech   On my initial evaluation, patient is:  -Vital signs stable. Patient afebrile, hemodynamically stable, and non-toxic appearing.     This patient's current presentation, including their history and physical exam, there is concern for acute psychiatric decompensation/acute mania versus psychosis.  Given his significant limp chapping, also concern for dehydration, electrolyte derangement therefore will obtain basic lab work and rehydrate both orally and with IV medication.  Patient was agitated and nonredirectable verbally, was  refusing all lab work, at this time do not feel the patient has capacity to refuse medical management or diagnoses therefore was given 10 mg Haldol  IM  without resolution.  Given this, he then received 5 mg of Versed  and 50 mg Benadryl  IM with resolution of patient's agitation.  BMP without any electrolyte abnormalities.  Evidence of infection.  No electrolyte signs that are concerning for dehydration.  Patient given 1 L LR, comparable Aquaphor placed on lips and feet.  Patient with repeated picking and biting, believe that this is self-induced.     Given his psychiatric acute decompensation with concern for acute psychosis worsening mania, will place in psych observation/hold.  TSS and THC both consulted, psychiatry defer to IRIS.  At this  time, pending nurse evaluation for further disposition.   Interpretations, interventions, and the patient's course of care are documented below.    Psych ROS:  Depression: Patient unable to answer due to psychosis Anxiety:  Patient unable to answer due to psychosis Mania (lifetime and current): Unknown Psychosis: (lifetime and current): Per chart previous history of psychosis  Collateral information:  none  Review of Systems  Constitutional:  Negative for fever.  Respiratory:  Negative for cough.   Skin:        Bilateral bottoms of feet-scabbing  Neurological:  Negative for tremors.  Psychiatric/Behavioral:  Positive for hallucinations. The patient is nervous/anxious.      Psychiatric and Social History  Psychiatric History:  Information collected from chart review, patient  Prev Dx/Sx: Per chart review he has a history of schizophrenia versus schizoaffective disorder, cannabis abuse, nicotine  use disorder, alcohol use, malingering Current Psych Provider: Patient unable to answer due to psychosis Home Meds (current):  Patient unable to answer due to psychosis Previous Med Trials: Per chart- Haldol , Haldol  LAI, Depakote , trazodone , BuSpar  Therapy:  Patient unable to answer due to psychosis  Prior Psych Hospitalization: Per chart yes Prior Self Harm: Patient denies Prior Violence: Patient  denies.  However patient has become agitated while in the emergency department and has required medications for agitation.  Patient is currently in custody   Family Psych History:  Patient unable to answer due to psychosis Family Hx suicide:  Patient unable to answer due to psychosis  Social History:  Developmental Hx:  Patient unable to answer due to psychosis Educational Hx:  Patient unable to answer due to psychosis Occupational Hx:  Patient unable to answer due to psychosis Legal Hx: Currently in custody of law enforcement Living Situation: States he is homeless Spiritual Hx: deferred Access to weapons/lethal means: Denies but may not be reliable information due to psychosis  Substance History-denies all substance use.  However may not be reliable information due to current psychosis.  In addition patient has been in the custody of law's for [employer] since 11/11-UDS positive for benzodiazepines and marijuana Alcohol: Denies Tobacco: Denies Illicit drugs: Denies Prescription drug abuse: Denies Rehab hx: Denies  Exam Findings  Physical Exam:  Vital Signs:  Temp:  [98.2 F (36.8 C)-98.3 F (36.8 C)] 98.3 F (36.8 C) (11/15 0453) Pulse Rate:  [78-97] 95 (11/15 0453) Resp:  [14-18] 18 (11/15 0453) BP: (121-132)/(79-103) 121/79 (11/15 0453) SpO2:  [100 %] 100 % (11/15 0453) Blood pressure 121/79, pulse 95, temperature 98.3 F (36.8 C), temperature source Axillary, resp. rate 18, SpO2 100%. There is no height or weight on file to calculate BMI.  Physical Exam Pulmonary:     Effort: No respiratory distress.  Neurological:     Mental  Status: He is alert and oriented to person, place, and time.  Psychiatric:        Attention and Perception: He is inattentive. He perceives auditory hallucinations.        Mood and Affect: Mood is anxious.        Speech: Speech is rapid and pressured.        Behavior: Behavior is agitated and actively hallucinating.        Judgment: Judgment is  impulsive.     Mental Status Exam: General Appearance: Bizarre and Disheveled  Orientation:  Full (Time, Place, and Person)  Memory:  Immediate;   Poor Recent;   Poor Remote;   Poor  Concentration:  Concentration: Poor and Attention Span: Poor  Recall:  Poor  Attention  Poor  Eye Contact:  Poor  Speech:  Garbled and Pressured  Language:  Poor  Volume:  Normal  Mood: Bizarre/flat  Affect:  Labile  Thought Process:  Disorganized  Thought Content:  Illogical and Hallucinations: Auditory  Suicidal Thoughts:  No  Homicidal Thoughts:  No  Judgement:  Impaired  Insight:  Lacking  Psychomotor Activity:  Restlessness  Akathisia:  No  Fund of Knowledge:  Poor      Assets:  Physical Health Resilience Social Support  Cognition: Difficult to assess due to current psychosis  ADL's:  Intact  AIMS (if indicated):        Other History   These have been pulled in through the EMR, reviewed, and updated if appropriate.  Family History:  The patient's family history includes Asthma in an other family member; Cancer in an other family member; Seizures in his father and another family member.  Medical History: Past Medical History:  Diagnosis Date   ADHD (attention deficit hyperactivity disorder)    Asthma    Bipolar 1 disorder (HCC)    Depression    Schizophrenia (HCC)    Seizures (HCC)     Surgical History: Past Surgical History:  Procedure Laterality Date   COLONOSCOPY WITH ESOPHAGOGASTRODUODENOSCOPY (EGD)  10/28/2012   MFM:Dpwhoz tiny distal esophageal erosions consistent with mild erosive reflux esophagitis. Hiatal hernia. 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 no h.pylori   NO PAST SURGERIES     none       Medications:   Current Facility-Administered Medications:    busPIRone  (BUSPAR ) tablet 15 mg, 15 mg, Oral, TID, Almousa,  Siham, MD, 15 mg at 09/05/24 9157   divalproex  (DEPAKOTE  ER) 24 hr tablet 500 mg, 500 mg, Oral, BID, Almousa, Siham, MD, 500 mg at 09/05/24 9157   haloperidol  (HALDOL ) tablet 20 mg, 20 mg, Oral, Daily, Almousa, Siham, MD, 20 mg at 09/05/24 9157   traZODone  (DESYREL ) tablet 150 mg, 150 mg, Oral, QHS, Almousa, Siham, MD  Current Outpatient Medications:    busPIRone  (BUSPAR ) 15 MG tablet, Take 1 tablet (15 mg total) by mouth 3 (three) times daily., Disp: 9 tablet, Rfl: 0   divalproex  (DEPAKOTE  ER) 500 MG 24 hr tablet, Take 1 tablet (500 mg total) by mouth 2 (two) times daily., Disp: 6 tablet, Rfl: 0   haloperidol  (HALDOL ) 20 MG tablet, Take 1 tablet (20 mg total) by mouth daily., Disp: 3 tablet, Rfl: 0   nystatin-triamcinolone (MYCOLOG II) cream, Apply to affected area daily, Disp: 15 g, Rfl: 0   traZODone  (DESYREL ) 150 MG tablet, Take 1 tablet (150 mg total) by mouth at bedtime., Disp: 3 tablet, Rfl:  0  Allergies: Allergies  Allergen Reactions   Acetaminophen  Hives    Pt reports that he takes tylenol  but it was previously reported as an allergy.   Bee Pollen Hives   Shellfish Allergy Hives    David VEAR Batter, NP

## 2024-09-05 NOTE — ED Notes (Addendum)
 Pt continues to be agitated, shaking bed rails and shackles. Pt has intermittent verbal outbursts as well. Officers remain at bedside.

## 2024-09-05 NOTE — ED Notes (Signed)
 Pt is yelling out, talking nonsensically. Officers at bedside, pt in 4 point forensic restraints. Pt becoming loud and boisterous, and is attempting to get out of forensic restraints. EDP notified.

## 2024-09-05 NOTE — ED Notes (Signed)
 Pt has open wounds to bilateral lateral aspect of heels. Wound to L heel appears to have some purulent drainage. Wound to R heel is open, but no drainage seen. Pictures of wounds taken and uploaded to media tab. EDP Tegeler notified of wounds.

## 2024-09-05 NOTE — ED Provider Notes (Signed)
 Patient still manic/psychotic. Labs reassuring. No stimulants. Medically cleared for TTS consultation and psych recommendations.    Lorette Mayo, MD 09/05/24 [phone]

## 2024-09-05 NOTE — ED Notes (Addendum)
 Pt refuses to take PO meds at this time

## 2024-09-05 NOTE — ED Notes (Signed)
Bladder scan : 480

## 2024-09-05 NOTE — ED Notes (Signed)
 Pt rested quietly with eyes closed for a few minutes after medication administration. Now is awake again, shaking side rails/forensic restraints constantly. Officers X 2 remain at bedside with patient and pt remains in 4 point forensic restraints.

## 2024-09-06 DIAGNOSIS — R451 Restlessness and agitation: Secondary | ICD-10-CM

## 2024-09-06 LAB — CBC WITH DIFFERENTIAL/PLATELET
Abs Immature Granulocytes: 0.03 K/uL (ref 0.00–0.07)
Basophils Absolute: 0 K/uL (ref 0.0–0.1)
Basophils Relative: 0 %
Eosinophils Absolute: 0.1 K/uL (ref 0.0–0.5)
Eosinophils Relative: 1 %
HCT: 42.1 % (ref 39.0–52.0)
Hemoglobin: 14.2 g/dL (ref 13.0–17.0)
Immature Granulocytes: 0 %
Lymphocytes Relative: 18 %
Lymphs Abs: 1.4 K/uL (ref 0.7–4.0)
MCH: 30.3 pg (ref 26.0–34.0)
MCHC: 33.7 g/dL (ref 30.0–36.0)
MCV: 90 fL (ref 80.0–100.0)
Monocytes Absolute: 0.8 K/uL (ref 0.1–1.0)
Monocytes Relative: 10 %
Neutro Abs: 5.5 K/uL (ref 1.7–7.7)
Neutrophils Relative %: 71 %
Platelets: 284 K/uL (ref 150–400)
RBC: 4.68 MIL/uL (ref 4.22–5.81)
RDW: 13.9 % (ref 11.5–15.5)
WBC: 7.8 K/uL (ref 4.0–10.5)
nRBC: 0 % (ref 0.0–0.2)

## 2024-09-06 LAB — BASIC METABOLIC PANEL WITH GFR
Anion gap: 11 (ref 5–15)
BUN: 14 mg/dL (ref 6–20)
CO2: 26 mmol/L (ref 22–32)
Calcium: 9.1 mg/dL (ref 8.9–10.3)
Chloride: 99 mmol/L (ref 98–111)
Creatinine, Ser: 0.97 mg/dL (ref 0.61–1.24)
GFR, Estimated: >60 mL/min (ref >60)
Glucose, Bld: 126 mg/dL — ABNORMAL HIGH (ref 70–99)
Potassium: 4.1 mmol/L (ref 3.5–5.1)
Sodium: 136 mmol/L (ref 135–145)

## 2024-09-06 LAB — CK: Total CK: 2825 U/L — ABNORMAL HIGH (ref 49–397)

## 2024-09-06 MED ORDER — HALOPERIDOL LACTATE 5 MG/ML IJ SOLN
5.0000 mg | Freq: Three times a day (TID) | INTRAMUSCULAR | Status: DC | PRN
  Administered 2024-09-06 – 2024-09-08 (×2): 5 mg via INTRAMUSCULAR
  Filled 2024-09-06 (×2): qty 1

## 2024-09-06 MED ORDER — HALOPERIDOL 5 MG PO TABS
5.0000 mg | ORAL_TABLET | Freq: Three times a day (TID) | ORAL | Status: DC | PRN
  Administered 2024-09-09: 5 mg via ORAL
  Filled 2024-09-06: qty 1

## 2024-09-06 MED ORDER — ZIPRASIDONE MESYLATE 20 MG IM SOLR
20.0000 mg | Freq: Once | INTRAMUSCULAR | Status: DC
  Administered 2024-09-06: 20 mg via INTRAMUSCULAR
  Filled 2024-09-06: qty 20

## 2024-09-06 MED ORDER — LORAZEPAM 1 MG PO TABS
1.0000 mg | ORAL_TABLET | Freq: Once | ORAL | Status: AC
  Administered 2024-09-06: 1 mg via ORAL
  Filled 2024-09-06: qty 1

## 2024-09-06 MED ORDER — LORAZEPAM 2 MG/ML IJ SOLN
2.0000 mg | Freq: Three times a day (TID) | INTRAMUSCULAR | Status: DC | PRN
  Administered 2024-09-07 – 2024-09-08 (×2): 2 mg via INTRAMUSCULAR
  Filled 2024-09-06 (×2): qty 1

## 2024-09-06 MED ORDER — LORAZEPAM 1 MG PO TABS
2.0000 mg | ORAL_TABLET | Freq: Three times a day (TID) | ORAL | Status: DC | PRN
  Administered 2024-09-08 – 2024-09-09 (×2): 2 mg via ORAL
  Filled 2024-09-06 (×2): qty 2

## 2024-09-06 NOTE — ED Provider Notes (Signed)
 Emergency Medicine Observation Re-evaluation Note  David Blankenship is a 38 y.o. male, seen on rounds today.  Pt initially presented to the ED for complaints of Dehydration Currently, the patient is talking to himself  Physical Exam  BP (!) 147/94   Pulse 71   Temp 98.2 F (36.8 C)   Resp 18   SpO2 100%  Physical Exam General: NAD Cardiac: Regular HR Lungs: No resp distress  ED Course / MDM  EKG:   I have reviewed the labs performed to date as well as medications administered while in observation.    Plan  Current plan is for inpatient psychiatric placement  The patient is in police custody.  The jail officials at bedside report that they are familiar with this patient and, he typically does have mental health concerns, but has been much more agitated for the past few days and is not behaving like himself.  The patient appears to be responding to voices in the room, has been speaking fairly agitated for the past 2 days.  I have filed an IVC at this time as he is requiring sedative medications for his own safety and others, and he is not able to provide consent or demonstrate capacity for that decision.  He does demonstrate evidence of psychosis with auditory hallucinations.  He has pressured speech.  He is still requiring restraints.  He developed some pressure blistering under his restraints, and his psychiatric nurse was able to pad this region with bandaging.      Cottie Donnice PARAS, MD 09/06/24 [phone]

## 2024-09-06 NOTE — ED Notes (Addendum)
 Pt resting quietly with eyes closed. No longer thrashing in bed or shaking bed rails. Restraints removed by officer at bedside, and new dressings placed. Blister on R wrist is now open, draining serous fluid. Nonadherent pad wrapped with kerlix placed on both wrists. Forensic restraints reapplied by officer at bedside.

## 2024-09-06 NOTE — ED Notes (Signed)
 Upon first check of skin under forensic restraints, blisters noted to bilateral wrist. No redness or open wounds noted. Officers at bedside removed restraints, nonadherent pad placed and wrapped with kerlix to provide skin protection from direct contact with restraints. Forensic restraints repositioned by officers at bedside. Pt tolerated well. Patient continues to shake restraints and bed rails.

## 2024-09-06 NOTE — ED Notes (Signed)
 Patient was given a Turkey sandwich bag, and Ginger ale.

## 2024-09-06 NOTE — ED Notes (Signed)
 Per magistrate office their system is down, passing on IVC paperwork to rose was told to call back in an hour

## 2024-09-06 NOTE — ED Notes (Signed)
 Received report from previous shift. Pt currently loud, singing and talking incoherently. Pt remains in police custody with 2 officers at bedside and remains in 4 point forensic restraints. No S/S of distress.

## 2024-09-06 NOTE — ED Notes (Signed)
 Pt yelling, anxious, and shaking in bed with shackles on, prn agitation meds to be given

## 2024-09-06 NOTE — Consult Note (Signed)
 [facility] Psychiatric Consult Initial  Patient Name: .David Blankenship  MRN: [id number]  DOB: 12-27-1985  Consult Order details:  Orders (From admission, onward)     Start     Ordered   09/04/24 1815  CONSULT TO CALL ACT TEAM       Comments: C/f acute decomp schizophrenia and psychosis vs mania  Ordering Provider: Billy Pal, MD  Provider:  (Not yet assigned)  Question:  Reason for Consult?  Answer:  Psych consult   09/04/24 1816             Mode of Visit: In person    Psychiatry Consult Evaluation  Service Date: September 06, 2024 LOS:  LOS: 0 days  Chief Complaint initially presented for with [facility] department from jail with reports of dehydration and hallucinations  Primary Psychiatric Diagnoses  Psychosis 2.  Schizoaffective disorder versus schizophrenia   Assessment  David Blankenship is a 38 y.o. male admitted: Presented to the EDfor 09/04/2024  5:27 PM initially presented for with [facility] department from jail with reports of dehydration and hallucinations.  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   His current presentation of psychosis with disorganized thought process and auditory hallucinations is most consistent with psychosis. He meets criteria for inpatient psychiatric admission based on current symptomology.  Patient states he has been off his medications for 8 days.  He is unable to state who prescribes medications.  Patient initially presented to the emergency department from jail with [facility] deputies with reported dehydration and hallucinations.  While in the ED, he has exhibited episodes of significant agitation requiring a ministration of emergency medications.  He is currently in 4-point forensic restraints with  to [facility] deputies at bedside as he remains in custody.  09/06/2024  Attempted to assess patient. He has 2 corporate treasurer at his side and remains in 4 point forensic restraints. Patient refused to answer any questions or participate in assessment.   Per Nursing, Pt is spitting on sheriff dept officers at his bedside. Instructed to stop. No evidence of understanding. Mask placed on patient by officer. Patient shaking bed rails and shackles, trying to shake bed side to side.    Call contact Faye Gallaway (Aunt) and Doris Baiz (grandmother)- both on the line.  Report patient's mother and father are both limited and not in patient's eye.  Grandmother raised the patient from time he was 28-month-old.  Biological mother had left him in a crib and never came back.  He was found and then grandmother obtain custody of him.  Reports he is always had mental problems.  States he has been diagnosed with schizophrenia, bipolar, and ADHD.  States overall he does fairly well when he is medicated.  However patient did spend 5-6 years in prison and since that time has not been stable.  He is allowed to live with them but chooses not to. At baseline pt is able to communicate and care for self. Reports patient has a girlfriend who is well-known to the psychiatric service line. He has decompensated since being with her, he was living with her.  At one point the state did have guardianship over patient and he resided in a group home.  They are unclear why patient is now his own legal guardian.  States he has been  communicating with Mr. Gladis [phone] who has helped him in the past when he was in a group home.  Did try to reach Mr. Gladis with no success.   09/05/2024 On assessment, patient appears disheveled with minimal eye contact.  Speech is pressured, garbled, and difficult to understand at times.  He is alert to self and place and reports he was brought to the hospital because he has not  taken his psychiatric medications in 8 days.  Patient is restless, frequently shifting in the bed, and intermittently pulling at restraints.  He currently denies suicidal ideation and homicidal ideation.  He denies auditory/visual hallucinations; however, he is observed responding to internal/external stimuli.  At 1 point, he place his hands on his lips and stated, shush cannot you hear that.  Though he was unable to elaborate further regarding what he was hearing.  Assessment is limited due to patient's minimal participation in disorganized thought process.  He appears acutely psychotic and unable to provide a reliable history at this time.  Based on current presentation, patient would benefit from inpatient psychiatric admission for safety, medication reinitiation, and stabilization.   Please see plan below for detailed recommendations.   Diagnoses:  Active Hospital problems: Principal Problem:   Psychosis (HCC)    Plan   ## Psychiatric Medication Recommendations:  Continue - Depakote  500 mg twice daily - Haldol  20 mg daily - BuSpar  15 mg 3 times daily - Trazodone  150 mg nightly  ## Medical Decision Making Capacity: Not specifically addressed in this encounter  ## Further Work-up:  -- Obtain EKG  -- most recent EKG on 04/10/2024 had QtC of 430 -- Pertinent labwork reviewed earlier this admission includes: CBC, CMP, U/A, UDS positive for marijuana and benzodiazepines   ## Disposition:--Patient currently voluntary however he is in custody of [facility] department.  He is recommended for inpatient psychiatric admission.  Social work notified and is seeking placement  ## Behavioral / Environmental: -Difficult Patient (SELECT OPTIONS FROM BELOW), To minimize splitting of staff, assign one staff person to communicate all information from the team when feasible., or Utilize compassion and acknowledge the patient's experiences while setting clear and realistic  expectations for care.    ## Safety and Observation Level:  - Based on my clinical evaluation, I estimate the patient to be at high risk of self harm in the current setting. - At this time, we recommend  1:1 Observation. This decision is based on my review of the chart including patient's history and current presentation, interview of the patient, mental status examination, and consideration of suicide risk including evaluating suicidal ideation, plan, intent, suicidal or self-harm behaviors, risk factors, and protective factors. This judgment is based on our ability to directly address suicide risk, implement suicide prevention strategies, and develop a safety plan while the patient is in the clinical setting. Please contact our team if there is a concern that risk level has changed.  CSSR Risk Category:   Suicide Risk Assessment: Patient has following modifiable risk factors for suicide: recklessness, medication noncompliance, active mental illness (to encompass adhd, tbi, mania, psychosis, trauma reaction), current symptoms: anxiety/panic, insomnia, impulsivity, anhedonia, hopelessness, and recent psychiatric hospitalization, which we are addressing by recommending inpatient psychiatric admission. Patient has following non-modifiable or demographic risk factors for suicide: male gender and psychiatric hospitalization Patient has the following protective factors against suicide: Access to outpatient mental health care  Thank you for this consult request. Recommendations have been communicated to the primary team.  We will  continue to follow while awaiting psychiatric bed placement at this time.   Elveria VEAR Batter, NP       History of Present Illness  Relevant Aspects of Hospital ED Course:  Admitted on 09/04/2024 nitially presented for with [facility] department from jail with reports of dehydration and hallucinations.  He carries the psychiatric diagnoses of schizophrenia versus  schizoaffective disorder, cannabis use disorder, nicotine  use disorder, alcohol use disorder, malingering and has a past medical history of asthma, seizures IBS and GERD.   Patient Report:  I been off my meds for 8 days  09/04/2024 Olla Bone MD, emergency room provider, Jaxxon Naeem is a 38 y.o. male with a PMH of MDD, schizophrenia, BPD 1, ADHD, asthma, seizures who presents to the ED for auditory hallucinations, concerns for mania, as well as concerns for dehydration.  Patient states that he has been out of his medication for approximately 8 days and has had increasing dryness and chapped of his lips as well as his feet.  Further history is difficult to obtain given patient is nonlinear in his speech.   Physical exam is notable for: - Oral mucosa dry, significantly chapped lips, -- Bilateral feet with skin tears, chafing -- Responding to internal stimuli, hallucinations, delusions, pressured and rapid speech   On my initial evaluation, patient is:  -Vital signs stable. Patient afebrile, hemodynamically stable, and non-toxic appearing.     This patient's current presentation, including their history and physical exam, there is concern for acute psychiatric decompensation/acute mania versus psychosis.  Given his significant limp chapping, also concern for dehydration, electrolyte derangement therefore will obtain basic lab work and rehydrate both orally and with IV medication.  Patient was agitated and nonredirectable verbally, was refusing all lab work, at this time do not feel the patient has capacity to refuse medical management or diagnoses therefore was given 10 mg Haldol  IM without resolution.  Given this, he then received 5 mg of Versed  and 50 mg Benadryl  IM with resolution of patient's agitation.  BMP without any electrolyte abnormalities.  Evidence of infection.  No electrolyte signs that are concerning for dehydration.  Patient given 1 L LR, comparable Aquaphor placed on lips  and feet.  Patient with repeated picking and biting, believe that this is self-induced.     Given his psychiatric acute decompensation with concern for acute psychosis worsening mania, will place in psych observation/hold.  TSS and THC both consulted, psychiatry defer to IRIS.  At this  time, pending nurse evaluation for further disposition.   Interpretations, interventions, and the patient's course of care are documented below.    Psych ROS:  Depression: Patient unable to answer due to psychosis Anxiety:  Patient unable to answer due to psychosis Mania (lifetime and current): Unknown Psychosis: (lifetime and current): Per chart previous history of psychosis  Collateral information:  none  Review of Systems  Constitutional:  Negative for fever.  Respiratory:  Negative for cough.   Skin:        Bilateral bottoms of feet-scabbing  Neurological:  Negative for tremors.  Psychiatric/Behavioral:  Positive for hallucinations. The patient is nervous/anxious.      Psychiatric and Social History  Psychiatric History:  Information collected from chart review, patient  Prev Dx/Sx: Per chart review he has a history of schizophrenia versus schizoaffective disorder, cannabis abuse, nicotine  use disorder, alcohol use, malingering Current Psych Provider: Patient unable to answer due to psychosis Home Meds (current):  Patient unable to answer due to psychosis Previous Med  Trials: Per chart- Haldol , Haldol  LAI, Depakote , trazodone , BuSpar  Therapy:  Patient unable to answer due to psychosis  Prior Psych Hospitalization: Per chart yes Prior Self Harm: Patient denies Prior Violence: Patient denies.  However patient has become agitated while in the emergency department and has required medications for agitation.  Patient is currently in custody   Family Psych History:  Patient unable to answer due to psychosis Family Hx suicide:  Patient unable to answer due to psychosis  Social History:   Developmental Hx:  Patient unable to answer due to psychosis Educational Hx:  Patient unable to answer due to psychosis Occupational Hx:  Patient unable to answer due to psychosis Legal Hx: Currently in custody of law enforcement Living Situation: States he is homeless Spiritual Hx: deferred Access to weapons/lethal means: Denies but may not be reliable information due to psychosis  Substance History-denies all substance use.  However may not be reliable information due to current psychosis.  In addition patient has been in the custody of law's for [employer] since 11/11-UDS positive for benzodiazepines and marijuana Alcohol: Denies Tobacco: Denies Illicit drugs: Denies Prescription drug abuse: Denies Rehab hx: Denies  Exam Findings  Physical Exam:  Vital Signs:  Temp:  [98.2 F (36.8 C)-98.4 F (36.9 C)] 98.2 F (36.8 C) (11/16 0638) Pulse Rate:  [71-83] 71 (11/16 0638) Resp:  [16-18] 18 (11/16 0638) BP: (115-152)/(73-94) 147/94 (11/16 0638) SpO2:  [100 %] 100 % (11/16 9361) Blood pressure (!) 147/94, pulse 71, temperature 98.2 F (36.8 C), resp. rate 18, SpO2 100%. There is no height or weight on file to calculate BMI.  Physical Exam Pulmonary:     Effort: No respiratory distress.  Neurological:     Mental Status: He is alert and oriented to person, place, and time.  Psychiatric:        Attention and Perception: He is inattentive. He perceives auditory hallucinations.        Mood and Affect: Mood is anxious.        Speech: Speech is rapid and pressured.        Behavior: Behavior is agitated and actively hallucinating.        Judgment: Judgment is impulsive.     Mental Status Exam: General Appearance: Bizarre and Disheveled  Orientation:  Full (Time, Place, and Person)  Memory:  Immediate;   Poor Recent;   Poor Remote;   Poor  Concentration:  Concentration: Poor and Attention Span: Poor  Recall:  Poor  Attention  Poor  Eye Contact:  Poor  Speech:  Garbled and  Pressured  Language:  Poor  Volume:  Normal  Mood: Bizarre/flat  Affect:  Labile  Thought Process:  Disorganized  Thought Content:  Illogical and Hallucinations: Auditory  Suicidal Thoughts:  No  Homicidal Thoughts:  No  Judgement:  Impaired  Insight:  Lacking  Psychomotor Activity:  Restlessness  Akathisia:  No  Fund of Knowledge:  Poor      Assets:  Physical Health Resilience Social Support  Cognition: Difficult to assess due to current psychosis  ADL's:  Intact  AIMS (if indicated):        Other History   These have been pulled in through the EMR, reviewed, and updated if appropriate.  Family History:  The patient's family history includes Asthma in an other family member; Cancer in an other family member; Seizures in his father and another family member.  Medical History: Past Medical History:  Diagnosis Date   ADHD (attention deficit hyperactivity disorder)  Asthma    Bipolar 1 disorder (HCC)    Depression    Schizophrenia (HCC)    Seizures (HCC)     Surgical History: Past Surgical History:  Procedure Laterality Date   COLONOSCOPY WITH ESOPHAGOGASTRODUODENOSCOPY (EGD)  10/28/2012   MFM:Dpwhoz tiny distal esophageal erosions consistent with mild erosive reflux esophagitis. Hiatal hernia. 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 no h.pylori   NO PAST SURGERIES     none       Medications:   Current Facility-Administered Medications:    busPIRone  (BUSPAR ) tablet 15 mg, 15 mg, Oral, TID, Almousa, Siham, MD, 15 mg at 09/06/24 0841   divalproex  (DEPAKOTE  ER) 24 hr tablet 500 mg, 500 mg, Oral, BID, Almousa, Siham, MD, 500 mg at 09/06/24 0841   doxycycline  (VIBRA -TABS) tablet 100 mg, 100 mg, Oral, Q12H, Jerrol Agent, MD, 100 mg at 09/06/24 0841   haloperidol  (HALDOL ) tablet 20 mg, 20 mg, Oral, Daily, Almousa, Siham, MD,  20 mg at 09/06/24 0841   traZODone  (DESYREL ) tablet 150 mg, 150 mg, Oral, QHS, Almousa, Siham, MD, 150 mg at 09/05/24 2118  Current Outpatient Medications:    busPIRone  (BUSPAR ) 15 MG tablet, Take 1 tablet (15 mg total) by mouth 3 (three) times daily., Disp: 9 tablet, Rfl: 0   divalproex  (DEPAKOTE  ER) 500 MG 24 hr tablet, Take 1 tablet (500 mg total) by mouth 2 (two) times daily., Disp: 6 tablet, Rfl: 0   haloperidol  (HALDOL ) 20 MG tablet, Take 1 tablet (20 mg total) by mouth daily., Disp: 3 tablet, Rfl: 0   nystatin-triamcinolone (MYCOLOG II) cream, Apply to affected area daily, Disp: 15 g, Rfl: 0   traZODone  (DESYREL ) 150 MG tablet, Take 1 tablet (150 mg total) by mouth at bedtime., Disp: 3 tablet, Rfl: 0  Allergies: Allergies  Allergen Reactions   Acetaminophen  Hives    Pt reports that he takes tylenol  but it was previously reported as an allergy.   Bee Pollen Hives   Shellfish Allergy Hives    Elveria VEAR Batter, NP

## 2024-09-06 NOTE — ED Notes (Signed)
 Dr. Cottie EDP notified of blisters under forensic restraints.

## 2024-09-06 NOTE — ED Notes (Signed)
 Pt is spitting on sheriff dept officers at his bedside. Instructed to stop. No evidence of understanding. Mask placed on patient by officer. Patient shaking bed rails and shackles, trying to shake bed side to side. Notified EDP Trifan about concern of patient safety d/t persistent agitation despite ordered meds given.

## 2024-09-06 NOTE — ED Notes (Addendum)
 Pt aaox2 to self and place, follows commands. Dressing to all extremities noted, condom catheter in place. Pt has been repositioned, shackles rearranged, blankets were placed under feet for support. Food provided.

## 2024-09-06 NOTE — ED Notes (Signed)
 [employer] delivered custody order at 12:56.  IVC File No. [id number]. #[id number].  Cannot e-file until Monday.  Document saved in patient's chart and in [address] for e-filing by a Monday staffed secretary.  Will leave note. IVC'd and served 09/06/2024, exp 09/13/2024. Documents will be kept in orange zone per RN preference until Custody Order can be e-filed Monday.

## 2024-09-06 NOTE — ED Notes (Signed)
 Wounds to bilateral heels assessed, cleansed, and new dressings applied per wound care orders. Dressed with xeroform gauze, nonadherent pad, and wrapped with kerlix.

## 2024-09-06 NOTE — ED Notes (Signed)
 Assuming pt care, pt bib PD coming from jail for dehydration. Pt resting, room arranged for safety, all belongings removed and placed in designated area, pt wanded by security prior to 7pm, statistician at bedside.

## 2024-09-06 NOTE — ED Notes (Signed)
 Pt tore dressings under wrist restraints completely off with his teeth. Pt is swaying bed and shaking restraints on bed rails. EDP and psych NP notified via secure chat of concern for patient safety and request for something for agitation.

## 2024-09-06 NOTE — Consult Note (Cosign Needed Addendum)
  Notified by nursing that patient continues to be agitated and is thrashing around in bed despite having 4 point friends at restraints.  Patient current medication regimen includes Haldol  20 mg daily, BuSpar  15 mg 3 times daily, and Depakote  500 mg twice daily.  Despite current medication regimen patient continues to display severe agitation as mentioned.  Primary care team/APP ordered Geodon  20 mg for agitation and it was administered.  Due to continued agitation we will add the following as needed medications - Haldol  5 mg p.o. or IM every 8 hour as needed severe agitation - Ativan  2 mg p.o. or IM every 8 hours as needed for severe agitation.  Both Geodon  and Haldol  carry risk for QTc prolongation.  When using these agents together, there is an increased risk for additive QTc effects, hypotension, extrapyramidal symptoms and oversedation.  Patient will require close monitoring including:  -Ongoing assessment of vital signs, respiratory status, and level of alertness - Monitoring for abnormal movements (EPS), acute dystonic reactions, rigidity - Observation for syncope, dizziness, palpitations or other cardiac symptoms - Consider rechecking electrolytes -potassium and magnesium  to ensure they remain within normal range.   Repeat EKG completed and QTc is 461.   Daily Haldol  exposure may reach 35 mg, which is slightly above typical daily dosing, but remains acceptable in the context of severe agitation with appropriate monitoring for side effects and cardiac risk.  Discussed case in full with supervising psychiatrist Dr. Moussa Akintayo and he is in agreement with plan of care.SABRA

## 2024-09-06 NOTE — ED Notes (Signed)
 Pt appears to be responding to internal stimuli. Pointing and yelling, stay back! Repeatedly. Unclear who/what patient is talking to/about.

## 2024-09-06 NOTE — Progress Notes (Signed)
 Patient has been denied by [facility] due to no appropriate beds available. Patient meets BH inpatient criteria per Elveria Batter, NP. Patient has been faxed out to the following facilities:   [facility]  [address] [place] [place] [address] [phone]  [facility]  [address] [place] [address] [phone]  [facility]  [address], [place] [address] [phone]  [facility]  [address] [place], [place] [address] [phone]  [facility] Patient Placement  [facility], [place] [phone]  [facility]  [address] [place] [address] [phone]  [facility]  [address] [place] [address] [phone]  [facility]  [address] [place] [address] [phone]  [facility]  [address] [place] [address] [phone]  [facility]  [address] [place] [address] [phone]  [facility]  [address], [place] [address] [phone]  [facility]  [address], [place] [address] [phone]  [facility]  [address], [place] [address] [phone]  [facility]  [address] [place] [address] [phone]  [facility]  [address] [place] [address] [phone]  [facility] [address]  [place] [address] [phone]  [facility]  [address] [facility] [address] [place] [address] [phone]   Bunnie Gallop, MSW,  LCSW-A  11:22 AM 09/06/2024

## 2024-09-06 NOTE — ED Notes (Signed)
 Pt pulled off condom cath. Asked patient if he would let us  know when needs to urinate so we can help him use the urinal. No evidence that patient understands what I am asking him. Patient began talking nonsensically and incoherently. Applied new condom catheter.

## 2024-09-06 NOTE — ED Notes (Signed)
 Pt pulled off condom cath again, is uncooperative with staff and is biting at forensic restraints and dressings under restraints. Instructed patient to stop biting at/tearing off dressings under forensic restraints due to skin breakdown already present and at high risk for further skin breakdown due to behaviors. No evidence of understanding. Pt continues to chant and rap loudly.

## 2024-09-07 LAB — VALPROIC ACID LEVEL: Valproic Acid Lvl: 58 ug/mL (ref 50–100)

## 2024-09-07 MED ORDER — DIVALPROEX SODIUM ER 500 MG PO TB24
1000.0000 mg | ORAL_TABLET | Freq: Two times a day (BID) | ORAL | Status: DC
  Administered 2024-09-07 – 2024-09-09 (×4): 1000 mg via ORAL
  Filled 2024-09-07 (×4): qty 2

## 2024-09-07 NOTE — Progress Notes (Signed)
 Inpatient Psychiatric Referral  Patient was recommended inpatient per Elveria Batter, NP. There are no available beds at [facility], per [facility]. Patient was referred to the following out of network facilities:  Destination  Service Provider Address Phone Fax  [facility]  [address] [place] [place] [address] [phone]  [facility]  [address] [place] [address] [phone]  [facility]  [address], [place] [address] [phone]  [facility]  [address] [place], [place] [address] [phone]  [facility] Patient Placement  [facility], [place] [phone]  [facility]  [address], [place] [address] [phone]  [facility]  [address] [place] [address] [phone]  [facility]  [address] [place] [address] [phone]  [facility]  [address] [place] [address] [phone]  [facility]  [address] [place] [address] [phone]  [facility]  [address], [place] [address] [phone]  [facility]  [address], [place] [address] [phone]  [facility]  [address] [place] [address] [phone]  [facility]  [address] [place] [address] [phone]  [facility]  [address] [place] [address] [phone]  [facility] [address]  [place] [address] [phone]  [facility]  [address] [facility] [place] [address] [phone]    Situation ongoing, CSW to continue following and update chart as more information becomes available.   Harrie Sofia MSW, LCSWA 09/07/2024

## 2024-09-07 NOTE — ED Notes (Signed)
 Case Number: [id number] IVC current and verified.

## 2024-09-07 NOTE — ED Notes (Signed)
 Pt awake and rapping.

## 2024-09-07 NOTE — ED Notes (Signed)
 Pt repositioned, restraints assessed, dressing to all extremities for support, blanket placed under feet, food given to pt

## 2024-09-07 NOTE — ED Notes (Signed)
 Pt anxious, shaking shackles in bed,speaking aggressively to officer, redirected pt to try to sleep

## 2024-09-07 NOTE — ED Notes (Signed)
 Patient was fed lunch

## 2024-09-07 NOTE — ED Notes (Signed)
 Pt yelling, shaking shackles, uncooperative, prn agitation meds to be given

## 2024-09-07 NOTE — ED Notes (Signed)
 Pt in up in bed with jail officer, and handcuff to bed. Offered water  and pt had water  and denied any other needs. Bed in lowest position. Wheels locked. Rails up x2

## 2024-09-07 NOTE — ED Notes (Signed)
 Pt was repositioned/slided up on bed, restraints checked, dressing to all extremities for support still in place

## 2024-09-07 NOTE — ED Provider Notes (Signed)
  Physical Exam  BP (!) 146/102 (BP Location: Left Arm)   Pulse 100   Temp 98 F (36.7 C) (Oral)   Resp 14   Ht 6' 3 (1.905 m)   Wt 90.7 kg   SpO2 100%   BMI 24.99 kg/m   Physical Exam  Procedures  Procedures  ED Course / MDM   Clinical Course as of 09/07/24 0809  Fri Sep 04, 2024  2105 EKG sinus rhythm at a rate of 98, QT/QTc 390/478, no ST elevations or depressions concerning for acute ischemia [SA]  2145 Lab work is reassuring, patient received 1 L LR, will need evaluation by IRIS.  [SA]  2147 Home meds and diet in [SA]  2342 IRIS to see and evaluate for decompensated schizophrenia, c/f acute mania/psychosis [SA]    Clinical Course User Index [SA] Billy Pal, MD   Medical Decision Making Amount and/or Complexity of Data Reviewed Labs: ordered.  Risk Prescription drug management.   Patient IVC.  Inpatient treatment has been recommended.       Patsey Lot, MD 09/07/24 [phone]

## 2024-09-07 NOTE — Consult Note (Addendum)
 [facility] Psychiatric Consult Initial  Patient Name: .David Blankenship  MRN: [id number]  DOB: [birth year]  Consult Order details:  Orders (From admission, onward)     Start     Ordered   09/04/24 1815  CONSULT TO CALL ACT TEAM       Comments: C/f acute decomp schizophrenia and psychosis vs mania  Ordering Provider: Billy Pal, MD  Provider:  (Not yet assigned)  Question:  Reason for Consult?  Answer:  Psych consult   09/04/24 1816             Mode of Visit: In person    Psychiatry Consult Evaluation  Service Date: September 07, 2024 LOS:  LOS: 0 days  Chief Complaint initially presented for with [facility] department from jail with reports of dehydration and hallucinations  Primary Psychiatric Diagnoses  Psychosis 2.  Schizoaffective disorder versus schizophrenia   Assessment  David Blankenship is a 38 y.o. male admitted: Presented to the EDfor 09/04/2024  5:27 PM initially presented for with [facility] department from jail with reports of dehydration and hallucinations.  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   His current presentation of psychosis with disorganized thought process and auditory hallucinations is most consistent with psychosis. He meets criteria for inpatient psychiatric admission based on current symptomology.  Patient states he has been off his medications for 8 days.  He is unable to state who prescribes medications.  Patient initially presented to the emergency department from jail with [facility] deputies with reported dehydration and hallucinations.  While in the ED, he has exhibited episodes of significant agitation requiring a ministration of emergency medications.  He is currently in 4-point forensic restraints with  to [facility] deputies at bedside as he remains in custody.  09/07/2024 patient has remained in the ED since 09/04/2024 total 3 days.  He continues to require medications for agitation while in the facility and remains in forensic restraints with 2 deputies at bedside.  On today's assessment, patient is calm and fairly cooperative.  He denies suicidal ideation, homicidal ideation, and auditory/visual hallucinations.  He reports he is doing good and asked if he could be discharged home.  His speech is garbled and difficult to understand at times appears this may be patient's baseline.  His thought process remains disorganized although he appears slightly improved compared to prior assessments.  He does not currently appear to be responding to internal/external stimuli.  Depakote  level obtained and is 58.  Case discussed with attending psychiatrist Dr. Merilee who is in agreement of increasing the medication Depakote  from 500 mg twice daily to 1000 mg twice daily for mood stabilization and ongoing management of agitation.  Plan to continue monitoring in the ED with ongoing safety measures a social worker has sought psychiatric bed placement and patient has been declined.  09/06/2024  Attempted to assess patient. He has 2 corporate treasurer at his side and remains in 4 point forensic restraints. Patient refused to answer any questions or participate in assessment.   Per Nursing, Pt is spitting on sheriff dept officers at his bedside. Instructed to stop. No evidence of understanding. Mask placed on patient by officer. Patient shaking bed rails and shackles, trying to shake bed side to side.   Call contact Nichole Gearing (Aunt) and Donia Gearing (grandmother)- both on  the line.  Report patient's mother and father are both limited and not in patient's eye.  Grandmother raised the patient from time he was 17-month-old.  Biological mother had left him in a crib and never came back.  He was  found and then grandmother obtain custody of him.  Reports he is always had mental problems.  States he has been diagnosed with schizophrenia, bipolar, and ADHD.  States overall he does fairly well when he is medicated.  However patient did spend 5-6 years in prison and since that time has not been stable.  He is allowed to live with them but chooses not to. At baseline pt is able to communicate and care for self. Reports patient has a girlfriend who is well-known to the psychiatric service line. He has decompensated since being with her, he was living with her.  At one point the state did have guardianship over patient and he resided in a group home.  They are unclear why patient is now his own legal guardian.  States he has been communicating with Mr. Gladis [phone] who has helped him in the past when he was in a group home.  Did try to reach Mr. Gladis with no success.   09/05/2024 On assessment, patient appears disheveled with minimal eye contact.  Speech is pressured, garbled, and difficult to understand at times.  He is alert to self and place and reports he was brought to the hospital because he has not taken his psychiatric medications in 8 days.  Patient is restless, frequently shifting in the bed, and intermittently pulling at restraints.  He currently denies suicidal ideation and homicidal ideation.  He denies auditory/visual hallucinations; however, he is observed responding to internal/external stimuli.  At 1 point, he place his hands on his lips and stated, shush cannot you hear that.  Though he was unable to elaborate further regarding what he was hearing.  Assessment is limited due to patient's minimal participation in disorganized thought process.  He appears acutely psychotic and unable to provide a reliable history at this time.  Based on current presentation, patient would benefit from inpatient psychiatric admission for safety, medication reinitiation, and  stabilization.   Please see plan below for detailed recommendations.   Diagnoses:  Active Hospital problems: Principal Problem:   Psychosis (HCC)    Plan   ## Psychiatric Medication Recommendations:    - increase Depakote  to 1000 mg BID Continue - Haldol  20 mg daily - BuSpar  15 mg 3 times daily - Trazodone  150 mg nightly -Haldol  5 mg PO or IM Q8H PRN for agitation  -Ativan  2 mg PO or IM Q8H PRN for agitation   ## Medical Decision Making Capacity: Not specifically addressed in this encounter  ## Further Work-up:   --Valproic acid  level 58 --    -- most recent EKG on 09/06/2024 QtC of 461   -- Pertinent labwork reviewed earlier this admission includes: CBC, CMP, U/A, UDS positive for marijuana and benzodiazepines   ## Disposition:--Patient currently voluntary however he is in custody of [facility] department.  He is recommended for inpatient psychiatric admission.  Social work notified and is seeking placement  ## Behavioral / Environmental: -Difficult Patient (SELECT OPTIONS FROM BELOW), To minimize splitting of staff, assign one staff person to communicate all information from the team when feasible., or Utilize compassion and acknowledge the patient's experiences while setting clear and realistic expectations for care.    ## Safety and Observation Level:  - Based on my  clinical evaluation, I estimate the patient to be at high risk of self harm in the current setting. - At this time, we recommend  1:1 Observation. This decision is based on my review of the chart including patient's history and current presentation, interview of the patient, mental status examination, and consideration of suicide risk including evaluating suicidal ideation, plan, intent, suicidal or self-harm behaviors, risk factors, and protective factors. This judgment is based on our ability to directly address suicide risk, implement suicide prevention strategies, and develop a safety plan  while the patient is in the clinical setting. Please contact our team if there is a concern that risk level has changed.  CSSR Risk Category:C-SSRS RISK CATEGORY: No Risk  Suicide Risk Assessment: Patient has following modifiable risk factors for suicide: recklessness, medication noncompliance, active mental illness (to encompass adhd, tbi, mania, psychosis, trauma reaction), current symptoms: anxiety/panic, insomnia, impulsivity, anhedonia, hopelessness, and recent psychiatric hospitalization, which we are addressing by recommending inpatient psychiatric admission. Patient has following non-modifiable or demographic risk factors for suicide: male gender and psychiatric hospitalization Patient has the following protective factors against suicide: Access to outpatient mental health care  Thank you for this consult request. Recommendations have been communicated to the primary team.  We will continue to follow while awaiting psychiatric bed placement at this time.   Elveria VEAR Batter, NP       History of Present Illness  Relevant Aspects of Hospital ED Course:  Admitted on 09/04/2024 nitially presented for with [facility] department from jail with reports of dehydration and hallucinations.  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   Patient Report:  I been off my meds for 8 days  09/04/2024 Olla Bone MD, emergency room provider, Iokepa Geffre is a 38 y.o. male with a PMH of MDD, schizophrenia, BPD 1, ADHD, asthma, seizures who presents to the ED for auditory hallucinations, concerns for mania, as well as concerns for dehydration.  Patient states that he has been out of his medication for approximately 8 days and has had increasing dryness and chapped of his lips as well as his feet.   Further history is difficult to obtain given patient is nonlinear in his speech.   Physical exam is notable for: - Oral mucosa dry, significantly chapped lips, -- Bilateral feet with skin tears, chafing -- Responding to internal stimuli, hallucinations, delusions, pressured and rapid speech   On my initial evaluation, patient is:  -Vital signs stable. Patient afebrile, hemodynamically stable, and non-toxic appearing.     This patient's current presentation, including their history and physical exam, there is concern for acute psychiatric decompensation/acute mania versus psychosis.  Given his significant limp chapping, also concern for dehydration, electrolyte derangement therefore will obtain basic lab work and rehydrate both orally and with IV medication.  Patient was agitated and nonredirectable verbally, was refusing all lab work, at this time do not feel the patient has capacity to refuse medical management or diagnoses therefore was given 10 mg Haldol  IM without resolution.  Given this, he then received 5 mg of Versed  and 50 mg Benadryl  IM with resolution of patient's agitation.  BMP without any electrolyte abnormalities.  Evidence of infection.  No electrolyte signs that are concerning for dehydration.  Patient given 1 L LR, comparable Aquaphor placed on lips and feet.  Patient with repeated picking and biting,  believe that this is self-induced.     Given his psychiatric acute decompensation with concern for acute psychosis worsening mania, will place in psych observation/hold.  TSS and THC both consulted, psychiatry defer to IRIS.  At this  time, pending nurse evaluation for further disposition.   Interpretations, interventions, and the patient's course of care are documented below.    Psych ROS:  Depression: Patient unable to answer due to psychosis Anxiety:  Patient unable to answer due to psychosis Mania (lifetime and current): Unknown Psychosis: (lifetime and current): Per chart  previous history of psychosis  Collateral information:  none  Review of Systems  Constitutional:  Negative for fever.  Respiratory:  Negative for cough.   Skin:        Bilateral bottoms of feet-scabbing  Neurological:  Negative for tremors.  Psychiatric/Behavioral:  Negative for hallucinations.      Psychiatric and Social History  Psychiatric History:  Information collected from chart review, patient  Prev Dx/Sx: Per chart review he has a history of schizophrenia versus schizoaffective disorder, cannabis abuse, nicotine  use disorder, alcohol use, malingering Current Psych Provider: Patient unable to answer due to psychosis Home Meds (current):  Patient unable to answer due to psychosis Previous Med Trials: Per chart- Haldol , Haldol  LAI, Depakote , trazodone , BuSpar  Therapy:  Patient unable to answer due to psychosis  Prior Psych Hospitalization: Per chart yes Prior Self Harm: Patient denies Prior Violence: Patient denies.  However patient has become agitated while in the emergency department and has required medications for agitation.  Patient is currently in custody   Family Psych History:  Patient unable to answer due to psychosis Family Hx suicide:  Patient unable to answer due to psychosis  Social History:  Developmental Hx:  Patient unable to answer due to psychosis Educational Hx:  Patient unable to answer due to psychosis Occupational Hx:  Patient unable to answer due to psychosis Legal Hx: Currently in custody of law enforcement Living Situation: States he is homeless Spiritual Hx: deferred Access to weapons/lethal means: Denies but may not be reliable information due to psychosis  Substance History-denies all substance use.  However may not be reliable information due to current psychosis.  In addition patient has been in the custody of law's for [employer] since 11/11-UDS positive for benzodiazepines and marijuana Alcohol: Denies Tobacco: Denies Illicit drugs:  Denies Prescription drug abuse: Denies Rehab hx: Denies  Exam Findings  Physical Exam:  Vital Signs:  Temp:  [97.8 F (36.6 C)-98.5 F (36.9 C)] 97.8 F (36.6 C) (11/17 1259) Pulse Rate:  [67-100] 67 (11/17 1259) Resp:  [14-18] 16 (11/17 1259) BP: (126-154)/(57-102) 126/93 (11/17 1259) SpO2:  [96 %-100 %] 100 % (11/17 1259) Weight:  [90.7 kg] 90.7 kg (11/17 0001) Blood pressure (!) 126/93, pulse 67, temperature 97.8 F (36.6 C), temperature source Oral, resp. rate 16, height 6' 3 (1.905 m), weight 90.7 kg, SpO2 100%. Body mass index is 24.99 kg/m.  Physical Exam Pulmonary:     Effort: No respiratory distress.  Neurological:     Mental Status: He is alert and oriented to person, place, and time.  Psychiatric:        Attention and Perception: He is inattentive.        Mood and Affect: Mood is anxious.        Behavior: Behavior is actively hallucinating. Behavior is not agitated.        Judgment: Judgment is impulsive.     Mental Status Exam: General Appearance: Bizarre and Disheveled  Orientation:  Full (Time, Place, and Person)  Memory:  Immediate;   Poor Recent;   Poor Remote;   Poor  Concentration:  Concentration: Poor and Attention Span: Poor  Recall:  Poor  Attention  Poor  Eye Contact:  Poor  Speech:  Garbled  Language:  Poor  Volume:  Normal  Mood: Bizarre/flat  Affect:  Labile  Thought Process:  Disorganized  Thought Content:  Illogical  Suicidal Thoughts:  No  Homicidal Thoughts:  No  Judgement:  Impaired  Insight:  Lacking  Psychomotor Activity:  Currently calm.  Per nursing report patient has been agitated and restless  Akathisia:  No  Fund of Knowledge:  Poor      Assets:  Physical Health Resilience Social Support  Cognition: Difficult to assess due to current psychosis  ADL's:  Intact  AIMS (if indicated):        Other History   These have been pulled in through the EMR, reviewed, and updated if appropriate.  Family History:  The  patient's family history includes Asthma in an other family member; Cancer in an other family member; Seizures in his father and another family member.  Medical History: Past Medical History:  Diagnosis Date  . ADHD (attention deficit hyperactivity disorder)   . Asthma   . Bipolar 1 disorder (HCC)   . Depression   . Schizophrenia (HCC)   . Seizures [facility])     Surgical History: Past Surgical History:  Procedure Laterality Date  . COLONOSCOPY WITH ESOPHAGOGASTRODUODENOSCOPY (EGD)  10/28/2012   MFM:Dpwhoz tiny distal esophageal erosions consistent with mild erosive reflux esophagitis. Hiatal hernia. 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 no h.pylori  . NO PAST SURGERIES    . none       Medications:   Current Facility-Administered Medications:  .  busPIRone  (BUSPAR ) tablet 15 mg, 15 mg, Oral, TID, Almousa, Siham, MD, 15 mg at 09/07/24 1305 .  divalproex  (DEPAKOTE  ER) 24 hr tablet 500 mg, 500 mg, Oral, BID, Almousa, Siham, MD, 500 mg at 09/07/24 1307 .  doxycycline  (VIBRA -TABS) tablet 100 mg, 100 mg, Oral, Q12H, Jerrol Agent, MD, 100 mg at 09/07/24 1307 .  haloperidol  (HALDOL ) tablet 20 mg, 20 mg, Oral, Daily, Almousa, Siham, MD, 20 mg at 09/07/24 1308 .  haloperidol  (HALDOL ) tablet 5 mg, 5 mg, Oral, Q8H PRN **OR** haloperidol  lactate (HALDOL ) injection 5 mg, 5 mg, Intramuscular, Q8H PRN, Mardy Elveria DEL, NP, 5 mg at 09/06/24 2353 .  LORazepam  (ATIVAN ) tablet 2 mg, 2 mg, Oral, Q8H PRN **OR** LORazepam  (ATIVAN ) injection 2 mg, 2 mg, Intramuscular, Q8H PRN, Mardy Elveria H, NP, 2 mg at 09/07/24 0211 .  traZODone  (DESYREL ) tablet 150 mg, 150 mg, Oral, QHS, Almousa, Siham, MD, 150 mg at 09/06/24 2140  Current Outpatient Medications:  .  busPIRone  (BUSPAR ) 15 MG tablet, Take 1 tablet (15 mg total) by mouth 3 (three) times daily., Disp: 9  tablet, Rfl: 0 .  divalproex  (DEPAKOTE  ER) 500 MG 24 hr tablet, Take 1 tablet (500 mg total) by mouth 2 (two) times daily., Disp: 6 tablet, Rfl: 0 .  haloperidol  (HALDOL ) 20 MG tablet, Take 1 tablet (20 mg total) by mouth daily., Disp: 3 tablet, Rfl: 0 .  nystatin-triamcinolone (MYCOLOG II) cream, Apply to affected area daily, Disp: 15 g, Rfl: 0 .  traZODone  (DESYREL ) 150 MG tablet, Take 1 tablet (150 mg total) by mouth at bedtime., Disp: 3 tablet,  Rfl: 0  Allergies: Allergies  Allergen Reactions  . Acetaminophen  Hives    Pt reports that he takes tylenol  but it was previously reported as an allergy.  . Bee Pollen Hives  . Shellfish Allergy Hives    Elveria VEAR Batter, NP

## 2024-09-08 DIAGNOSIS — F29 Unspecified psychosis not due to a substance or known physiological condition: Secondary | ICD-10-CM

## 2024-09-08 MED ORDER — ALBUTEROL SULFATE HFA 108 (90 BASE) MCG/ACT IN AERS
1.0000 | INHALATION_SPRAY | RESPIRATORY_TRACT | Status: DC | PRN
Start: 2024-09-08 — End: 2024-09-09
  Administered 2024-09-08: 1 via RESPIRATORY_TRACT
  Filled 2024-09-08: qty 6.7

## 2024-09-08 NOTE — Consult Note (Signed)
 [facility] Consult Initial  Patient Name: [name]  MRN: [id number]  DOB: 12-27-1985  Consult Order details:  Orders (From admission, onward)     Start     Ordered   09/04/24 1815  CONSULT TO CALL ACT TEAM       Comments: C/f acute decomp schizophrenia and psychosis vs mania  Ordering Provider: Billy Pal, MD  Provider:  (Not yet assigned)  Question:  Reason for Consult?  Answer:  Psych consult   09/04/24 1816             Mode of Visit: In person    Psychiatry Consult Evaluation  Service Date: September 08, 2024 LOS:  LOS: 0 days  Chief Complaint initially presented for with [facility] department from jail with reports of dehydration and hallucinations  Primary Psychiatric Diagnoses  Psychosis 2.  Schizoaffective disorder versus schizophrenia   Assessment  David [facility] is a 38 y.o. male admitted: Presented to the EDfor 09/04/2024  5:27 PM initially presented for with [facility] department from jail with reports of dehydration and hallucinations.  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   His current presentation of psychosis with disorganized thought process and auditory hallucinations is most consistent with psychosis. He meets criteria for inpatient psychiatric admission based on current symptomology.  Patient states he has been off his medications for 8 days.  He is unable to state who prescribes medications.  Patient initially presented to the emergency department from jail with [facility] deputies with reported dehydration and hallucinations.  While in the ED, he has exhibited episodes of significant agitation requiring a ministration of emergency medications.  He is currently in 4-point forensic restraints with  to [facility] deputies at bedside as he remains in custody.  09/08/2024 patient remains in the custody of the [facility] department.  [facility] is present at bedside throughout the assessment.  Patient continues to be maintained in 4-point forensic restraints.  On today's evaluation, the patient appears calm and cooperative.  He is more engaging compared to prior assessment and is able to answer questions appropriately.  He currently denies suicidal or homicidal ideation and denies auditory or visual hallucinations.  Patient reports understanding that he was brought to the hospital because he had been off his medications and that he is in custody due to trespassing, which he identifies as the reason for his legal detainment.  He remains somewhat withdrawn with limited eye contact.  Speech continues to be difficult to understand at times which appears to be consistent with his baseline.  Overall, the patient appears to be responding to the increased dosage of Depakote  and show signs of stabilization in the emergency department.  I discussed with the deputy at bedside the need for [facility] department to initiate the referral process to [facility] as referrals cannot be submitted by the hospital when the patient is in law enforcement custody.  Deputy provided the contact number for his superior [employer], at [phone].  A message was left at that number; no return call has been received at this time  09/07/2024 patient has remained in the ED since 09/04/2024 total 3 days.  He continues to require medications for agitation while in the facility and remains in forensic restraints with 2 deputies at bedside.  On today's assessment, patient is calm and fairly cooperative.  He denies suicidal ideation, homicidal ideation, and auditory/visual hallucinations.  He reports he is doing good and asked if he could be discharged home.  His speech is  garbled and difficult to understand at times appears this may be patient's baseline.  His thought process remains disorganized although he appears slightly improved compared to prior assessments.  He does not currently appear to be responding to internal/external stimuli.  Depakote  level obtained and is 58.  Case discussed with attending psychiatrist Dr. Merilee who is in agreement of increasing the medication Depakote  from 500 mg twice daily to 1000 mg twice daily for mood stabilization and ongoing management of agitation.  Plan to continue monitoring in the ED with ongoing safety measures a social worker has sought psychiatric bed placement and patient has been declined.  09/06/2024  Attempted to assess patient. He has 2 corporate treasurer at his side and remains in 4 point forensic restraints. Patient refused to answer any questions or participate in assessment.   Per Nursing, Pt is spitting on sheriff dept officers at his bedside. Instructed to stop. No evidence of understanding. Mask placed on patient by officer. Patient shaking bed rails and shackles, trying to shake bed side to side.   Call contact Faye Hullum (Aunt) and Doris Raker (grandmother)- both on the line.  Report patient's mother and father are both limited and not in patient's eye.  Grandmother raised the patient from time he was 53-month-old.  Biological mother had left him in a crib and never came back.  He was found and then grandmother obtain custody of him.  Reports he is always had mental problems.  States he has been diagnosed with schizophrenia, bipolar, and ADHD.  States overall he does fairly well when he is medicated.  However patient did spend 5-6 years in prison and since that time has not been stable.  He is allowed to live with them but chooses not to. At baseline pt is able to communicate and care for self. Reports patient has a girlfriend who is well-known to the psychiatric service line. He has decompensated since  being with her, he was living with her.  At one point the state did have guardianship over patient and he resided in a group home.  They are unclear why patient is now his own legal guardian.  States he has been communicating with Mr. Gladis [phone] who has helped him in the past when he was in a group home.  Did try to reach Mr. Gladis with no success.   09/05/2024 On assessment, patient appears disheveled with minimal eye contact.  Speech is pressured, garbled, and difficult to understand at times.  He is alert to self and place and reports he was brought to the hospital because he has not taken his psychiatric medications in 8 days.  Patient is restless, frequently shifting in the bed, and intermittently pulling at restraints.  He currently denies suicidal ideation and homicidal ideation.  He denies auditory/visual hallucinations; however, he is observed responding to internal/external stimuli.  At 1 point, he place his hands on his lips and stated, shush cannot you hear that.  Though he was unable to elaborate further regarding what he was hearing.  Assessment is limited due to patient's minimal participation in disorganized thought process.  He appears acutely psychotic and unable to provide a reliable history at this time.  Based on current presentation, patient would benefit from inpatient psychiatric admission for safety,  medication reinitiation, and stabilization.   Please see plan below for detailed recommendations.   Diagnoses:  Active Hospital problems: Principal Problem:   Psychosis (HCC)    Plan   ## Psychiatric Medication Recommendations:   Continue - iDepakote to 1000 mg BID - Haldol  20 mg daily - BuSpar  15 mg 3 times daily - Trazodone  150 mg nightly -Haldol  5 mg PO or IM Q8H PRN for agitation  -Ativan  2 mg PO or IM Q8H PRN for agitation   ## Medical Decision Making Capacity: Not specifically addressed in this encounter  ## Further Work-up:   --Valproic acid   level 58 --    -- most recent EKG on 09/06/2024 QtC of 461   -- Pertinent labwork reviewed earlier this admission includes: CBC, CMP, U/A, UDS positive for marijuana and benzodiazepines   ## Disposition:--Patient currently voluntary however he is in custody of [facility] department.  He is recommended for inpatient psychiatric admission.  Social work notified and is seeking placement  ## Behavioral / Environmental: -Difficult Patient (SELECT OPTIONS FROM BELOW), To minimize splitting of staff, assign one staff person to communicate all information from the team when feasible., or Utilize compassion and acknowledge the patient's experiences while setting clear and realistic expectations for care.    ## Safety and Observation Level:  - Based on my clinical evaluation, I estimate the patient to be at high risk of self harm in the current setting. - At this time, we recommend  1:1 Observation. This decision is based on my review of the chart including patient's history and current presentation, interview of the patient, mental status examination, and consideration of suicide risk including evaluating suicidal ideation, plan, intent, suicidal or self-harm behaviors, risk factors, and protective factors. This judgment is based on our ability to directly address suicide risk, implement suicide prevention strategies, and develop a safety plan while the patient is in the clinical setting. Please contact our team if there is a concern that risk level has changed.  CSSR Risk Category:C-SSRS RISK CATEGORY: No Risk  Suicide Risk Assessment: Patient has following modifiable risk factors for suicide: recklessness, medication noncompliance, active mental illness (to encompass adhd, tbi, mania, psychosis, trauma reaction), current symptoms: anxiety/panic, insomnia, impulsivity, anhedonia, hopelessness, and recent psychiatric hospitalization, which we are addressing by recommending inpatient  psychiatric admission. Patient has following non-modifiable or demographic risk factors for suicide: male gender and psychiatric hospitalization Patient has the following protective factors against suicide: Access to outpatient mental [facility] care  Thank you for this consult request. Recommendations have been communicated to the primary team.  We will continue to follow while awaiting psychiatric bed placement at this time.   Elveria VEAR Batter, NP       History of Present Illness  Relevant Aspects of Hospital ED Course:  Admitted on 09/04/2024 nitially presented for with [facility] department from jail with reports of dehydration and hallucinations.  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   Patient Report:  I been off my meds for 8 days  09/04/2024 Olla Bone MD, emergency room provider, Khris Jansson is a 38 y.o. male with a PMH of MDD, schizophrenia, BPD 1, ADHD, asthma, seizures who presents to the ED for auditory hallucinations, concerns for mania, as well as concerns for dehydration.  Patient states that he has been out of his medication for approximately  8 days and has had increasing dryness and chapped of his lips as well as his feet.  Further history is difficult to obtain given patient is nonlinear in his speech.   Physical exam is notable for: - Oral mucosa dry, significantly chapped lips, -- Bilateral feet with skin tears, chafing -- Responding to internal stimuli, hallucinations, delusions, pressured and rapid speech   On my initial evaluation, patient is:  -Vital signs stable. Patient afebrile, hemodynamically stable, and non-toxic appearing.     This patient's current presentation, including their history and physical exam, there is concern for acute psychiatric  decompensation/acute mania versus psychosis.  Given his significant limp chapping, also concern for dehydration, electrolyte derangement therefore will obtain basic lab work and rehydrate both orally and with IV medication.  Patient was agitated and nonredirectable verbally, was refusing all lab work, at this time do not feel the patient has capacity to refuse medical management or diagnoses therefore was given 10 mg Haldol  IM without resolution.  Given this, he then received 5 mg of Versed  and 50 mg Benadryl  IM with resolution of patient's agitation.  BMP without any electrolyte abnormalities.  Evidence of infection.  No electrolyte signs that are concerning for dehydration.  Patient given 1 L LR, comparable Aquaphor placed on lips and feet.  Patient with repeated picking and biting, believe that this is self-induced.     Given his psychiatric acute decompensation with concern for acute psychosis worsening mania, will place in psych observation/hold.  TSS and THC both consulted, psychiatry defer to IRIS.  At this  time, pending nurse evaluation for further disposition.   Interpretations, interventions, and the patient's course of care are documented below.    Psych ROS:  Depression: Denies Anxiety: Denies Mania (lifetime and current): Denies Psychosis: (lifetime and current): Per chart previous history of psychosis  Collateral information:  none  Review of Systems  Constitutional:  Negative for fever.  Respiratory:  Negative for cough.   Skin:        Bilateral bottoms of feet-scabbing  Neurological:  Negative for tremors.  Psychiatric/Behavioral:  Negative for hallucinations.      Psychiatric and Social History  Psychiatric History:  Information collected from chart review, patient  Prev Dx/Sx: Per chart review he has a history of schizophrenia versus schizoaffective disorder, cannabis abuse, nicotine  use disorder, alcohol use, malingering Current Psych Provider: none  Home Meds  (current): Does not remember the name of his medications Previous Med Trials: Per chart- Haldol , Haldol  LAI, Depakote , trazodone , BuSpar  Therapy: None  Prior Psych Hospitalization: Per chart yes Prior Self Harm: Patient denies Prior Violence: Patient denies.  However patient has become agitated while in the emergency department and has required medications for agitation.  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  However may not be reliable information due to current psychosis.  In addition patient has been in the custody of law's for [employer] since 11/11-UDS positive for benzodiazepines and marijuana Alcohol: Denies Tobacco: Denies Illicit drugs: Denies Prescription drug abuse: Denies Rehab hx: Denies  Exam Findings  Physical Exam:  Vital Signs:  Temp:  [98.5 F (36.9 C)-98.8  F (37.1 C)] 98.5 F (36.9 C) (11/18 1419) Pulse Rate:  [94-105] 105 (11/18 1419) Resp:  [17-20] 17 (11/18 1419) BP: (126-144)/(86-103) 144/86 (11/18 1419) SpO2:  [97 %-100 %] 97 % (11/18 1419) Blood pressure (!) 144/86, pulse (!) 105, temperature 98.5 F (36.9 C), temperature source Axillary, resp. rate 17, height 6' 3 (1.905 m), weight 90.7 kg, SpO2 97%. Body mass index is 24.99 kg/m.  Physical Exam Pulmonary:     Effort: No respiratory distress.  Neurological:     Mental Status: He is alert and oriented to person, place, and time.  Psychiatric:        Attention and Perception: He is inattentive.        Mood and Affect: Mood is anxious.        Behavior: Behavior is not  agitated.        Judgment: Judgment is impulsive.     Mental Status Exam: General Appearance: Disheveled  Orientation:  Full (Time, Place, and Person)  Memory:  Immediate;   Fair Recent;   Fair Remote;   Fair  Concentration:  Concentration: Fair and Attention Span: Fair  Recall:  Fair  Attention  Fair  Eye Contact:  Fair  Speech: At times garbled and difficult to understand   Language:  Poor  Volume:  Normal  Mood: flat  Affect:  Labile  Thought Process:  Coherent-thought process does not appear disorganized at this time  Thought Content:  WDL  Suicidal Thoughts:  No  Homicidal Thoughts:  No  Judgement:  Impaired  Insight:  Lacking  Psychomotor Activity:  Currently calm.  Akathisia:  No  Fund of Knowledge:  Fair      Assets:  Physical [facility] Resilience Social Support  Cognition: Difficult to assess due to current psychosis  ADL's:  Intact  AIMS (if indicated):        Other History   These have been pulled in through the EMR, reviewed, and updated if appropriate.  Family History:  The patient's family history includes Asthma in an other family member; Cancer in an other family member; Seizures in his father and another family member.  Medical History: Past Medical History:  Diagnosis Date  . ADHD (attention deficit hyperactivity disorder)   . Asthma   . Bipolar 1 disorder (HCC)   . Depression   . Schizophrenia (HCC)   . Seizures [facility])     Surgical History: Past Surgical History:  Procedure Laterality Date  . COLONOSCOPY WITH ESOPHAGOGASTRODUODENOSCOPY (EGD)  10/28/2012   MFM:Dpwhoz tiny distal esophageal erosions consistent with mild erosive reflux esophagitis. Hiatal hernia. 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 no h.pylori  . NO PAST SURGERIES    . none       Medications:   Current Facility-Administered  Medications:  .  busPIRone  (BUSPAR ) tablet 15 mg, 15 mg, Oral, TID, Almousa, Siham, MD, 15 mg at 09/08/24 1242 .  divalproex  (DEPAKOTE  ER) 24 hr tablet 1,000 mg, 1,000 mg, Oral, BID, Kalei Meda H, NP, 1,000 mg at 09/08/24 1026 .  doxycycline  (VIBRA -TABS) tablet 100 mg, 100 mg, Oral, Q12H, Jerrol Agent, MD, 100 mg at 09/08/24 1026 .  haloperidol  (HALDOL ) tablet 20 mg, 20 mg, Oral, Daily, Almousa, Siham, MD, 20 mg at 09/08/24 1026 .  haloperidol  (HALDOL ) tablet 5 mg, 5 mg, Oral, Q8H PRN **OR** haloperidol  lactate (HALDOL ) injection 5 mg, 5 mg, Intramuscular, Q8H PRN, Mardy Elveria DEL, NP, 5 mg  at 09/08/24 0913 .  LORazepam  (ATIVAN ) tablet 2 mg, 2 mg, Oral, Q8H PRN, 2 mg at 09/08/24 1241 **OR** LORazepam  (ATIVAN ) injection 2 mg, 2 mg, Intramuscular, Q8H PRN, Mardy Coy H, NP, 2 mg at 09/08/24 0116 .  traZODone  (DESYREL ) tablet 150 mg, 150 mg, Oral, QHS, Almousa, Siham, MD, 150 mg at 09/07/24 2304  Current Outpatient Medications:  .  busPIRone  (BUSPAR ) 15 MG tablet, Take 1 tablet (15 mg total) by mouth 3 (three) times daily., Disp: 9 tablet, Rfl: 0 .  divalproex  (DEPAKOTE  ER) 500 MG 24 hr tablet, Take 1 tablet (500 mg total) by mouth 2 (two) times daily., Disp: 6 tablet, Rfl: 0 .  haloperidol  (HALDOL ) 20 MG tablet, Take 1 tablet (20 mg total) by mouth daily., Disp: 3 tablet, Rfl: 0 .  nystatin-triamcinolone (MYCOLOG II) cream, Apply to affected area daily, Disp: 15 g, Rfl: 0 .  traZODone  (DESYREL ) 150 MG tablet, Take 1 tablet (150 mg total) by mouth at bedtime., Disp: 3 tablet, Rfl: 0  Allergies: Allergies  Allergen Reactions  . Acetaminophen  Hives    Pt reports that he takes tylenol  but it was previously reported as an allergy.  . Bee Pollen Hives  . Shellfish Allergy Hives    Coy VEAR Mardy, NP

## 2024-09-08 NOTE — ED Notes (Signed)
 Case Number: [id number] Findings and custody uploaded to the patient's chart and e-filed to the [facility] of Courts.

## 2024-09-08 NOTE — ED Notes (Signed)
 Pt eating  sandwhich and snacks with assistance

## 2024-09-08 NOTE — ED Notes (Signed)
 Pt becoming increasingly agitated. Medicated per MAR.

## 2024-09-08 NOTE — ED Notes (Signed)
 Cleaned and change wound dressing

## 2024-09-08 NOTE — ED Notes (Signed)
 3 copies placed in the purple zone, 1 copy sent to medical records and 1 copy placed in the Magistrate's folder.

## 2024-09-08 NOTE — ED Provider Notes (Signed)
 Emergency Medicine Observation Re-evaluation Note  David [facility] is a 38 y.o. male, seen on rounds today.  Pt initially presented to the ED for complaints of Dehydration Currently, the patient is pending placement.  Physical Exam  BP 131/87 (BP Location: Left Arm)   Pulse 94   Temp 98.5 F (36.9 C) (Oral)   Resp 20   Ht 6' 3 (1.905 m)   Wt 90.7 kg   SpO2 100%   BMI 24.99 kg/m  Physical Exam Sleeping comfortably  ED Course / MDM  EKG:EKG Interpretation Date/Time:  Sunday September 06 2024 17:04:38 EST Ventricular Rate:  76 PR Interval:  118 QRS Duration:  88 QT Interval:  410 QTC Calculation: 461 R Axis:   88  Text Interpretation: Normal sinus rhythm Minimal voltage criteria for LVH, may be normal variant ( Sokolow-Lyon ) Borderline ECG Confirmed by Freddi Hamilton [phone]) on 09/07/2024 9:59:21 AM  I have reviewed the labs performed to date as well as medications administered while in observation.  Recent changes in the last 24 hours include still pending placement..  Plan  Current plan is for psychiatric placement.SABRA Patsey Lot, MD 09/08/24 [phone]

## 2024-09-08 NOTE — ED Notes (Signed)
 Pt became aggressive and started to pull at condom cather and called the tech a bitch multiple times and screamed at her and stated stop looking at me bitch

## 2024-09-08 NOTE — ED Notes (Signed)
 Pt provided with ice water

## 2024-09-08 NOTE — ED Notes (Signed)
Pt being fed lunch at this time

## 2024-09-09 MED ORDER — TRAZODONE HCL 150 MG PO TABS: 150.0000 mg | ORAL_TABLET | Freq: Every day | ORAL | 0 refills | Status: AC

## 2024-09-09 MED ORDER — DOXYCYCLINE HYCLATE 100 MG PO CAPS: 100.0000 mg | ORAL_CAPSULE | Freq: Two times a day (BID) | ORAL | 0 refills | Status: AC

## 2024-09-09 MED ORDER — ALBUTEROL SULFATE HFA 108 (90 BASE) MCG/ACT IN AERS: 1.0000 | INHALATION_SPRAY | Freq: Four times a day (QID) | RESPIRATORY_TRACT | 0 refills | Status: AC | PRN

## 2024-09-09 MED ORDER — BUSPIRONE HCL 15 MG PO TABS: 15.0000 mg | ORAL_TABLET | Freq: Three times a day (TID) | ORAL | 0 refills | Status: AC

## 2024-09-09 MED ORDER — HALOPERIDOL 5 MG PO TABS: 5.0000 mg | ORAL_TABLET | Freq: Three times a day (TID) | ORAL | 0 refills | Status: DC | PRN

## 2024-09-09 MED ORDER — LORAZEPAM 2 MG PO TABS: 2.0000 mg | ORAL_TABLET | Freq: Three times a day (TID) | ORAL | 0 refills | Status: AC | PRN

## 2024-09-09 MED ORDER — DIVALPROEX SODIUM ER 500 MG PO TB24: 1000.0000 mg | ORAL_TABLET | Freq: Two times a day (BID) | ORAL | 0 refills | Status: AC

## 2024-09-09 MED ORDER — HALOPERIDOL 10 MG PO TABS: 20.0000 mg | ORAL_TABLET | Freq: Every day | ORAL | 0 refills | Status: DC

## 2024-09-09 NOTE — ED Notes (Signed)
 Pt continues intermittently yelling out. Pt stated what's in your mouth? Put that on blood. RN redirecting pt at this time.

## 2024-09-09 NOTE — ED Notes (Signed)
 Reuqested patient to lower his voice as others are asleep.  Patient continues to ramble nonsense.

## 2024-09-09 NOTE — ED Notes (Signed)
 Spoke with [facility] provider and Admistration Nat patient is good to come back to jail if medically cleared and they will have their psychiatrist follow him and if and when he is released from custody he needs IVC'd and psych admission they will IVC when he is released.

## 2024-09-09 NOTE — ED Notes (Signed)
 I HAVE UPLOADED THE  RESCINTION FORM  AND E FILE PAPERS

## 2024-09-09 NOTE — ED Provider Notes (Addendum)
 Emergency Medicine Observation Re-evaluation Note  David [facility] is a 38 y.o. male, seen on rounds today.  Pt initially presented to the ED for complaints of Dehydration Currently, the patient is resting. No complaints   Physical Exam  BP 134/78 (BP Location: Left Leg)   Pulse 89   Temp 97.6 F (36.4 C) (Oral)   Resp 18   Ht 6' 3 (1.905 m)   Wt 90.7 kg   SpO2 94%   BMI 24.99 kg/m  Physical Exam General: nad  Cardiac: rr Lungs: non-labored  Psych: calm   ED Course / MDM  EKG:EKG Interpretation Date/Time:  Sunday September 06 2024 17:04:38 EST Ventricular Rate:  76 PR Interval:  118 QRS Duration:  88 QT Interval:  410 QTC Calculation: 461 R Axis:   88  Text Interpretation: Normal sinus rhythm Minimal voltage criteria for LVH, may be normal variant ( Sokolow-Lyon ) Borderline ECG Confirmed by Freddi Hamilton [phone]) on 09/07/2024 9:59:21 AM  I have reviewed the labs performed to date as well as medications administered while in observation.  Recent changes in the last 24 hours include none.  Plan  Current plan is for inpatient psych.  Update: After discussion with psych and TOC; patient felt to stable to discharge to jail with further psych treatment there. Continue current medications    Neysa Caron PARAS, DO 09/09/24 1016    Neysa Caron PARAS, DO 09/09/24 1132

## 2024-09-09 NOTE — ED Notes (Signed)
 Patient condom cath was leaking so condom cath changed, bed cleaned linens changed and brief changed.  Redressed wounds to bilateral heals that Sheriffs report have been present since he came in on 11/11

## 2024-09-09 NOTE — ED Notes (Signed)
 Attempted to call report to jail with no answer

## 2024-09-09 NOTE — Discharge Instructions (Addendum)
 Continue - iDepakote to 1000 mg BID - Haldol  20 mg daily - BuSpar  15 mg 3 times daily - Trazodone  150 mg nightly -Haldol  5 mg PO or IM Q8H PRN for agitation  -Ativan  2 mg PO or IM Q8H PRN for agitation   Continue to orally rehydrate with frequent sips of water  or low sugar Gatorade.  Local wound care to extremities.  Return for fevers, chills, lethargy, severe headache, vision changes, facial droop, chest pain, shortness of breath or difficulty breathing, wounds develop foul odor, drains pus or any new or worsening symptoms that are concerning to you.

## 2024-09-09 NOTE — ED Notes (Signed)
 Pt awake at this time and asking staff inappropriate questions. Pt also yelling derogatory slurs at officer. Pt stated you a faggot and stopping looking at me. RN attempting to redirect pt.

## 2024-09-09 NOTE — Progress Notes (Signed)
 LCSW Progress Note  [id number]   David Blankenship  09/09/2024  10:02 AM  Description:   Inpatient Psychiatric Referral  Patient was recommended inpatient per  Elveria Batter (NP). There are no available beds at [facility], per [facility] RN). Patient was referred to the following out of network facilities:   Destination  Service Provider Address Phone Fax  [facility]  [address] [employer] [place] [address] [phone]  [facility]  [address] [place] [address] [phone]  [facility]  [address], [place] [address] [phone]  [facility]  [address] [place], [place] [address] [phone]  [facility] Patient Placement  [facility], [place] [phone]  [facility]  [address], [place] [address] [phone]  [facility]  [address] [place] [address] [phone]  [facility]  [address] [place] [address] [phone]  [facility]  [address], [place] [address] [phone]  [facility]  [address] [place] [address] [phone]  [facility]  [address] [place] [address] [phone]  [facility]  [address], [place] [address] [phone]  [facility]  [address] [place] [address] [phone]  [facility]  [address] [place] [address] [phone]  [facility]  [address] [place] [address] [phone]   [facility] [address]  [place] [address] [phone]  [facility]  [facility] [address] [place] [address] [phone]  [facility]  [address] [place] [address] [phone]  [facility]  [address] [place], [place] [phone]      Situation ongoing, CSW to continue following and update chart as more information becomes available.      [place] Rita Vialpando, MSW, LCSW  09/09/2024 10:02 AM

## 2024-09-09 NOTE — ED Notes (Signed)
 Pt intermittently yelling out random verbiage. NT redirecting pt at this time. Officer at bedside.

## 2024-09-09 NOTE — ED Notes (Signed)
 Patient transported back to jail with [employer]. All discharge papers provided and report attempted to be called.

## 2024-09-09 NOTE — ED Notes (Signed)
 IVC is current

## 2024-09-09 NOTE — ED Notes (Signed)
 Patient randomly yelling out not making sense.  Patient continues to just stare out at nurses station. Officer at bedside.

## 2024-10-04 ENCOUNTER — Encounter (HOSPITAL_COMMUNITY): Payer: Self-pay

## 2024-10-04 ENCOUNTER — Emergency Department (HOSPITAL_COMMUNITY)
Admission: EM | Admit: 2024-10-04 | Discharge: 2024-10-07 | Disposition: A | Payer: MEDICAID | Attending: Emergency Medicine | Admitting: Emergency Medicine

## 2024-10-04 ENCOUNTER — Other Ambulatory Visit: Payer: Self-pay

## 2024-10-04 DIAGNOSIS — F29 Unspecified psychosis not due to a substance or known physiological condition: Secondary | ICD-10-CM | POA: Diagnosis present

## 2024-10-04 DIAGNOSIS — J45909 Unspecified asthma, uncomplicated: Secondary | ICD-10-CM | POA: Diagnosis not present

## 2024-10-04 LAB — COMPREHENSIVE METABOLIC PANEL WITH GFR
ALT: 23 U/L (ref 0–44)
AST: 35 U/L (ref 15–41)
Albumin: 4.5 g/dL (ref 3.5–5.0)
Alkaline Phosphatase: 57 U/L (ref 38–126)
Anion gap: 12 (ref 5–15)
BUN: 23 mg/dL — ABNORMAL HIGH (ref 6–20)
CO2: 25 mmol/L (ref 22–32)
Calcium: 9.6 mg/dL (ref 8.9–10.3)
Chloride: 101 mmol/L (ref 98–111)
Creatinine, Ser: 0.76 mg/dL (ref 0.61–1.24)
GFR, Estimated: >60 mL/min (ref >60)
Glucose, Bld: 84 mg/dL (ref 70–99)
Potassium: 4.3 mmol/L (ref 3.5–5.1)
Sodium: 138 mmol/L (ref 135–145)
Total Bilirubin: 0.4 mg/dL (ref 0.0–1.2)
Total Protein: 7.9 g/dL (ref 6.5–8.1)

## 2024-10-04 LAB — CK: Total CK: 445 U/L — ABNORMAL HIGH (ref 49–397)

## 2024-10-04 LAB — CBC WITH DIFFERENTIAL/PLATELET
Abs Immature Granulocytes: 0.01 K/uL (ref 0.00–0.07)
Basophils Absolute: 0 K/uL (ref 0.0–0.1)
Basophils Relative: 1 %
Eosinophils Absolute: 0.1 K/uL (ref 0.0–0.5)
Eosinophils Relative: 1 %
HCT: 37.8 % — ABNORMAL LOW (ref 39.0–52.0)
Hemoglobin: 12.5 g/dL — ABNORMAL LOW (ref 13.0–17.0)
Immature Granulocytes: 0 %
Lymphocytes Relative: 25 %
Lymphs Abs: 2.2 K/uL (ref 0.7–4.0)
MCH: 30.6 pg (ref 26.0–34.0)
MCHC: 33.1 g/dL (ref 30.0–36.0)
MCV: 92.6 fL (ref 80.0–100.0)
Monocytes Absolute: 1.2 K/uL — ABNORMAL HIGH (ref 0.1–1.0)
Monocytes Relative: 14 %
Neutro Abs: 5.2 K/uL (ref 1.7–7.7)
Neutrophils Relative %: 59 %
Platelets: 192 K/uL (ref 150–400)
RBC: 4.08 MIL/uL — ABNORMAL LOW (ref 4.22–5.81)
RDW: 15.4 % (ref 11.5–15.5)
WBC: 8.8 K/uL (ref 4.0–10.5)
nRBC: 0 % (ref 0.0–0.2)

## 2024-10-04 LAB — ETHANOL: Alcohol, Ethyl (B): <15 mg/dL (ref <15)

## 2024-10-04 MED ORDER — ZIPRASIDONE MESYLATE 20 MG IM SOLR: 20.0000 mg | Freq: Once | INTRAMUSCULAR | Status: DC

## 2024-10-04 MED ORDER — STERILE WATER FOR INJECTION IJ SOLN
INTRAMUSCULAR | Status: AC
  Administered 2024-10-04: 10 mL
  Filled 2024-10-04: qty 10

## 2024-10-04 MED ORDER — ZIPRASIDONE MESYLATE 20 MG IM SOLR
20.0000 mg | INTRAMUSCULAR | Status: AC | PRN
  Administered 2024-10-05: 07:00:00 20 mg via INTRAMUSCULAR
  Filled 2024-10-04: qty 20

## 2024-10-04 MED ORDER — RISPERIDONE 0.5 MG PO TBDP
2.0000 mg | ORAL_TABLET | Freq: Three times a day (TID) | ORAL | Status: DC | PRN
  Administered 2024-10-04 – 2024-10-06 (×5): 2 mg via ORAL
  Filled 2024-10-04 (×6): qty 4

## 2024-10-04 MED ORDER — ZIPRASIDONE MESYLATE 20 MG IM SOLR
10.0000 mg | Freq: Once | INTRAMUSCULAR | Status: AC
  Administered 2024-10-04: 10 mg via INTRAMUSCULAR
  Filled 2024-10-04: qty 20

## 2024-10-04 MED ORDER — LORAZEPAM 1 MG PO TABS
1.0000 mg | ORAL_TABLET | ORAL | Status: AC | PRN
  Administered 2024-10-05: 06:00:00 1 mg via ORAL
  Filled 2024-10-04: qty 1

## 2024-10-04 NOTE — ED Notes (Signed)
 Pt wanded by security.

## 2024-10-04 NOTE — ED Triage Notes (Addendum)
 Pt BIB [employer]. Pt was pulling his penis out in public, pt was getting locked up, the magistrate felt that he needs a psych evaluation. IVC paperwork brought in with pt. Pt not answering questions, pt rapping.

## 2024-10-04 NOTE — ED Provider Notes (Signed)
 [place] EMERGENCY DEPARTMENT AT [facility] Provider Note   CSN: [id number] Arrival date & time: 10/04/24  8057     Patient presents with: IVC   TORIE PRIEBE is a 38 y.o. male.   38 year old male presents with police after patient was exposing his genitals in public.  Review of her chart shows the patient does have a history of bipolar disorder as well as schizophrenia.  Patient has had repetitive speech at this time and would not cooperate with interview.  Patient was handcuffed and brought her       Prior to Admission medications  Medication Sig Start Date End Date Taking? Authorizing Provider  albuterol  (VENTOLIN  HFA) 108 (90 Base) MCG/ACT inhaler Inhale 1-2 puffs into the lungs every 6 (six) hours as needed for wheezing or shortness of breath. 09/09/24   Neysa Caron PARAS, DO  busPIRone  (BUSPAR ) 15 MG tablet Take 1 tablet (15 mg total) by mouth 3 (three) times daily. 09/09/24 10/09/24  Neysa Caron PARAS, DO  divalproex  (DEPAKOTE  ER) 500 MG 24 hr tablet Take 2 tablets (1,000 mg total) by mouth in the morning and at bedtime. 09/09/24 10/09/24  Neysa Caron PARAS, DO  doxycycline  (VIBRAMYCIN ) 100 MG capsule Take 1 capsule (100 mg total) by mouth 2 (two) times daily. 09/09/24   Neysa Caron PARAS, DO  haloperidol  (HALDOL ) 10 MG tablet Take 2 tablets (20 mg total) by mouth daily. 09/09/24 10/09/24  Neysa Caron PARAS, DO  haloperidol  (HALDOL ) 5 MG tablet Take 1 tablet (5 mg total) by mouth every 8 (eight) hours as needed for agitation. 09/09/24 10/09/24  Neysa Caron PARAS, DO  LORazepam  (ATIVAN ) 2 MG tablet Take 1 tablet (2 mg total) by mouth every 8 (eight) hours as needed for anxiety. 09/09/24 10/09/24  Neysa Caron PARAS, DO  nystatin -triamcinolone  (MYCOLOG II) cream Apply to affected area daily 07/30/24   Bernis Ernst, PA-C  traZODone  (DESYREL ) 150 MG tablet Take 1 tablet (150 mg total) by mouth at bedtime. 09/09/24 10/09/24  Neysa Caron PARAS, DO    Allergies: Acetaminophen ,  Bee pollen, and Shellfish allergy    Review of Systems  Unable to perform ROS: Psychiatric disorder    Updated Vital Signs BP (!) 153/101   Pulse 88   Resp (!) 22   SpO2 100%   Physical Exam Vitals and nursing note reviewed.  Constitutional:      General: He is not in acute distress.    Appearance: Normal appearance. He is well-developed. He is not toxic-appearing.  HENT:     Head: Normocephalic and atraumatic.  Eyes:     General: Lids are normal.     Conjunctiva/sclera: Conjunctivae normal.     Pupils: Pupils are equal, round, and reactive to light.  Neck:     Thyroid: No thyroid mass.     Trachea: No tracheal deviation.  Cardiovascular:     Rate and Rhythm: Normal rate and regular rhythm.     Heart sounds: Normal heart sounds. No murmur heard.    No gallop.  Pulmonary:     Effort: Pulmonary effort is normal. No respiratory distress.     Breath sounds: Normal breath sounds. No stridor. No decreased breath sounds, wheezing, rhonchi or rales.  Abdominal:     General: There is no distension.     Palpations: Abdomen is soft.     Tenderness: There is no abdominal tenderness. There is no rebound.  Musculoskeletal:        General: No tenderness. Normal range of  motion.     Cervical back: Normal range of motion and neck supple.  Skin:    General: Skin is warm and dry.     Findings: No abrasion or rash.  Neurological:     Mental Status: He is disoriented and confused.     Cranial Nerves: No cranial nerve deficit.     Sensory: No sensory deficit.     Motor: Motor function is intact.     Comments: Patient moves all 4 extremities at this time  Psychiatric:        Attention and Perception: He is inattentive.        Mood and Affect: Affect is labile.        Speech: Speech is rapid and pressured.        Behavior: Behavior is agitated, aggressive and hyperactive.     (all labs ordered are listed, but only abnormal results are displayed) Labs Reviewed  CBC WITH  DIFFERENTIAL/PLATELET  COMPREHENSIVE METABOLIC PANEL WITH GFR  URINE DRUG SCREEN  ETHANOL    EKG: None  Radiology: No results found.   Procedures   Medications Ordered in the ED  ziprasidone  (GEODON ) injection 10 mg (has no administration in time range)                                    Medical Decision Making Amount and/or Complexity of Data Reviewed Labs: ordered.  Risk Prescription drug management.  Patient's first exam completed.  Patient's EKG shows normal sinus rhythm.  No signs of QTc prolongation. Patient given Geodon  20 mg IM due to his severe agitation.  Patient reassessed multiple times and has become more calm and cooperative.  Labs here are reassuring.  Will require psychiatric evaluation.     Final diagnoses:  None    ED Discharge Orders     None          Dasie Faden, MD 10/04/24 2124

## 2024-10-04 NOTE — ED Notes (Signed)
 Pt given sausage biscuit and water .

## 2024-10-05 DIAGNOSIS — F29 Unspecified psychosis not due to a substance or known physiological condition: Secondary | ICD-10-CM | POA: Diagnosis not present

## 2024-10-05 LAB — URINE DRUG SCREEN
Amphetamines: NEGATIVE
Barbiturates: NEGATIVE
Benzodiazepines: POSITIVE — AB
Cocaine: NEGATIVE
Fentanyl: NEGATIVE
Methadone Scn, Ur: NEGATIVE
Opiates: NEGATIVE
Tetrahydrocannabinol: NEGATIVE

## 2024-10-05 MED ORDER — MIDAZOLAM HCL (PF) 2 MG/2ML IJ SOLN
2.0000 mg | Freq: Once | INTRAMUSCULAR | Status: AC
  Administered 2024-10-05: 02:00:00 2 mg via INTRAMUSCULAR
  Filled 2024-10-05: qty 2

## 2024-10-05 MED ORDER — LORAZEPAM 2 MG/ML IJ SOLN
2.0000 mg | Freq: Four times a day (QID) | INTRAMUSCULAR | Status: DC | PRN
  Administered 2024-10-05 – 2024-10-07 (×3): 2 mg via INTRAMUSCULAR
  Filled 2024-10-05 (×3): qty 1

## 2024-10-05 MED ORDER — ZIPRASIDONE MESYLATE 20 MG IM SOLR
20.0000 mg | Freq: Once | INTRAMUSCULAR | Status: AC
  Administered 2024-10-05: 12:00:00 20 mg via INTRAMUSCULAR
  Filled 2024-10-05: qty 20

## 2024-10-05 MED ORDER — LORAZEPAM 2 MG/ML IJ SOLN
2.0000 mg | Freq: Once | INTRAMUSCULAR | Status: AC
  Administered 2024-10-05: 15:00:00 2 mg via INTRAMUSCULAR
  Filled 2024-10-05: qty 1

## 2024-10-05 MED ORDER — DIPHENHYDRAMINE HCL 50 MG/ML IJ SOLN
50.0000 mg | Freq: Once | INTRAMUSCULAR | Status: AC
  Administered 2024-10-05: 15:00:00 50 mg via INTRAMUSCULAR
  Filled 2024-10-05: qty 1

## 2024-10-05 MED ORDER — HYDROXYZINE HCL 25 MG PO TABS
25.0000 mg | ORAL_TABLET | Freq: Three times a day (TID) | ORAL | Status: DC | PRN
  Administered 2024-10-05 – 2024-10-06 (×2): 25 mg via ORAL
  Filled 2024-10-05 (×2): qty 1

## 2024-10-05 MED ORDER — STERILE WATER FOR INJECTION IJ SOLN
INTRAMUSCULAR | Status: AC
  Administered 2024-10-05: 12:00:00 1.2 mL
  Filled 2024-10-05: qty 10

## 2024-10-05 MED ORDER — TRAZODONE HCL 50 MG PO TABS
50.0000 mg | ORAL_TABLET | Freq: Every evening | ORAL | Status: DC | PRN
  Administered 2024-10-06: 22:00:00 50 mg via ORAL
  Filled 2024-10-05: qty 1

## 2024-10-05 MED ORDER — HALOPERIDOL LACTATE 5 MG/ML IJ SOLN
5.0000 mg | Freq: Four times a day (QID) | INTRAMUSCULAR | Status: DC | PRN
  Administered 2024-10-05: 22:00:00 5 mg via INTRAMUSCULAR
  Filled 2024-10-05: qty 1

## 2024-10-05 NOTE — ED Notes (Signed)
 Patient still resting. Respirations even and unlabored. Will obtain vital signs when he wakes up

## 2024-10-05 NOTE — ED Notes (Signed)
 Patient resting comfortably with eyes closed. No signs of distress noted.

## 2024-10-05 NOTE — Progress Notes (Signed)
 LCSW Progress Note  [id number]   David Blankenship  10/05/2024  11:06 AM  Description:   Inpatient Psychiatric Referral  Patient was recommended inpatient per David Blankenship(NP). There [facility] beds at [facility], per [facility] David Blankenship). Patient was referred to the following out of network facilities: [facility] Provider Address Phone [facility]  [address], [place] [address] [phone]  [facility]  [address] [place] [address] [phone]  [facility]  [address] [place] [address] [phone]  [facility]  [address] [place] [address] [phone]  [facility]  [address] [place] [address] [phone]  [facility]  [address] [place] [address] [phone]  [facility]  [address] [place], [place] [phone]  [facility]  [address], [place] [address] [phone]  [facility]  [address], [place] [address] [phone]       Situation ongoing, CSW to continue following and update chart as more information becomes available.      David Blankenship MSW, LCSW  10/05/2024 11:06 AM

## 2024-10-05 NOTE — ED Notes (Signed)
 Pt increasingly agitated. Provided food and beverages.  Pt throwing trash, etc onto floor.  Pt constantly rambling speaking loudly with flight of ideas.  Pt up to desk yelling at staff. Security called.

## 2024-10-05 NOTE — ED Notes (Signed)
 Patient observed resting with eyes closed. No signs of distress noted. Respirations even and unlabored.

## 2024-10-05 NOTE — ED Notes (Signed)
 Patient sitting on the side of the bed eating. No sign of distress noted.

## 2024-10-05 NOTE — ED Notes (Signed)
 Pt woke up, severely agitated. Shouting at staff, mumbling incoherent sentences. See MAR.

## 2024-10-05 NOTE — ED Notes (Signed)
 Patient resting in bed with eyes closed. Nos signs of distress noted. Respirations are even and unlabored

## 2024-10-05 NOTE — ED Notes (Addendum)
 Patient observed resting in bed with eyes closed. No signs of distress noticed.

## 2024-10-05 NOTE — ED Notes (Signed)
 Patient is resting in room with eyes closed. No signs of distress noted. Respirations are even and unlabored.

## 2024-10-05 NOTE — ED Notes (Signed)
 Pt currently screaming, yelling, and dancing around in the hallway, MD notified. Security at bedside.

## 2024-10-05 NOTE — ED Notes (Signed)
 Patient is resting comfortably with eyes closed. No signs of distress noted. Respirations even and unlabored

## 2024-10-05 NOTE — ED Notes (Signed)
 Patient is sleeping at this time. Will get VSs when he wakes up.

## 2024-10-05 NOTE — ED Notes (Signed)
 Patient observed at nurses station, getting agitated, engaged in vulgar conversation. Signs of distress noted.

## 2024-10-05 NOTE — ED Notes (Signed)
 Pt has woken up and is screaming at staff and security.

## 2024-10-05 NOTE — ED Notes (Signed)
 Patient observed at nurse's station, engage din conversation. Appears to be agitated and wants to go home so that he can go to work.

## 2024-10-05 NOTE — Consult Note (Signed)
 [facility] Psychiatric Consult Initial  Patient Name: .David Blankenship  MRN: [id number]  DOB: 26-May-1986  Consult Order details:  Orders (From admission, onward)     Start     Ordered   10/04/24 2126  CONSULT TO CALL ACT TEAM       Ordering Provider: Dasie Faden, MD  Provider:  (Not yet assigned)  Question:  Reason for Consult?  Answer:  Psych consult   10/04/24 2125             Mode of Visit: In person    Psychiatry Consult Evaluation  Service Date: October 05, 2024 LOS:  LOS: 0 days  Chief Complaint Involuntary commitment (IVC) - public indecent exposure, disorganized and delusional behavior  Primary Psychiatric Diagnoses  Psychosis Schizoaffective disorder versus schizophrenia  Assessment  David Blankenship is a 38 y.o. male admitted: Presented to the EDfor 10/04/2024  7:43 PM for  public indecent exposure, disorganized and delusional behavior.  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     This 38 year old male presents with acute psychotic and mood symptoms, including grandiose delusions, sexually disinhibited behavior, impaired judgment, and poor insight, in the context of known bipolar disorder and schizophrenia with suspected medication nonadherence and substance use. His behavior has resulted in public indecent exposure and police involvement, indicating an inability to safely function in the community.  Despite denial of SI/HI, the patients severe impairment in judgment, disorganized thinking, delusional beliefs, and inability to care for himself place him at high risk and meet criteria for inpatient psychiatric hospitalization. Please see plan below for detailed recommendations.   Diagnoses:  Active Hospital problems: Principal Problem:   Psychosis (HCC)    Plan    ## Psychiatric Medication Recommendations:  Continue patient's home medications  ## Medical Decision Making Capacity: Not specifically addressed in this encounter  ## Further Work-up:  -- CK total 445 on 10/04/2024, will get valproate level acid on 10/06/2024 EKG, While pt on Qtc prolonging medications, please monitor & replete K+ to 4 and Mg2+ to 2, or UDS -- most recent EKG on 10/04/2024 had QtC of 436   ## Disposition:-- Patient currently voluntary however he is in custody of [facility] department.  He is recommended for inpatient psychiatric admission.  Social work notified and is seeking placement   ## Behavioral / Environmental: -Difficult Patient (SELECT OPTIONS FROM BELOW), To minimize splitting of staff, assign one staff person to communicate all information from the team when feasible., or Utilize compassion and acknowledge the patient's experiences while setting clear and realistic expectations for care.                ## Safety and Observation Level:  - Based on my clinical evaluation, I estimate the patient to be at high risk of self harm in the current setting. - At this time, we recommend  1:1 Observation. This decision is based on my review of the chart including patient's history and current presentation, interview of the patient, mental status examination, and consideration of suicide risk including evaluating suicidal ideation, plan, intent, suicidal or self-harm behaviors, risk factors, and protective factors. This judgment is based on our ability to directly address suicide risk, implement suicide prevention strategies, and develop a safety plan while the patient is in the clinical setting. Please contact our team if there is a concern  that risk level has changed.   CSSR Risk Category:C-SSRS RISK CATEGORY: No Risk   Suicide Risk Assessment: Patient has following modifiable risk factors for suicide: recklessness, medication noncompliance, active mental  illness (to encompass adhd, tbi, mania, psychosis, trauma reaction), current symptoms: anxiety/panic, insomnia, impulsivity, anhedonia, hopelessness, and recent psychiatric hospitalization, which we are addressing by recommending inpatient psychiatric admission. Patient has following non-modifiable or demographic risk factors for suicide: male gender and psychiatric hospitalization Patient has the following protective factors against suicide: Access to outpatient mental health care   Thank you for this consult request. Recommendations have been communicated to the primary team.  We will continue to follow while awaiting psychiatric bed placement at this time.    CATHALEEN ADAM, PMHNP       History of Present Illness  Relevant Aspects of Hospital ED Course:  Admitted on 10/04/2024 for Involuntary commitment (IVC) - public indecent exposure, disorganized and delusional behavior.   Patient Report:  The patient is a 38 year old male brought to the emergency department by police after being observed exposing his genitals and masturbating in public at a bus station. Per IVC documentation, the patient has been engaging in recurrent lewd behavior, including public masturbation, and is experiencing grandiose and identity delusions, stating he believes he is different people on most days. Most recently, he reported believing he is Medford Daring and that he is dating Rihanna.  Chart review indicates a known psychiatric history of bipolar disorder and schizophrenia. Per IVC, the patient is also known to abuse cocaine, has not been eating or sleeping adequately, and has not been attending to personal hygiene.  During evaluation, the patient states he is doing okay and feeling better. He appears elated, with presentation concerning for hypomania. He states he is ready to go home, reporting that he prayed and that God told him it was time to go. He provides inconsistent and unreliable history, stating  at different times that he works at [employer]  and then that he works at [employer].  The patient denies suicidal ideation, homicidal ideation, and auditory or visual hallucinations. Urine drug screen is positive for benzodiazepines and marijuana; the patient denies illicit substance or alcohol use. He is able to name Depakote  and trazodone  as his medications and reports compliance beginning last night.  The patient declined contact with family and reports no support system.  Psych ROS:  Depression: Denies Anxiety: Denies Mania (lifetime and current): Denies Psychosis: (lifetime and current): Per chart previous history of psychosis   Collateral information:  none  Review of Systems  Psychiatric/Behavioral:         Delusional      Psychiatric and Social History  Psychiatric History:  Information collected from chart review, patient   Prev Dx/Sx: Per chart review he has a history of schizophrenia versus schizoaffective disorder, cannabis abuse, nicotine  use disorder, alcohol use, malingering Current Psych Provider: none  Home Meds (current): Does not remember the name of his medications Previous Med Trials: Per chart- Haldol , Haldol  LAI, Depakote , trazodone , BuSpar  Therapy: None   Prior Psych Hospitalization: Per chart yes Prior Self Harm: Patient denies Prior Violence: Patient denies.  However patient has become agitated while in the emergency department and has required medications for agitation.  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  However may not be reliable information due to current psychosis.  In addition patient has been in the custody of law's for [employer] since 11/11-UDS positive for benzodiazepines and marijuana Alcohol: Denies Tobacco: Denies Illicit drugs: Denies Prescription drug abuse: Denies Rehab hx: Denies  Exam Findings  Physical Exam:  Vital Signs:  Temp:  [98.6 F (37 C)] 98.6 F (37 C) (12/15 0446) Pulse Rate:  [78-88] 78 (12/15 0446) Resp:  [19-22] 19 (12/15 0446) BP: (140-153)/(86-101) 140/86 (12/15 0446) SpO2:  [97 %-100 %] 97 % (12/15 0446) Blood pressure (!) 140/86, pulse 78, temperature 98.6 F (37 C), resp. rate 19, SpO2 97%. There is no height or weight on file to calculate BMI.  Physical Exam Psychiatric:        Attention and Perception: Attention normal.        Mood and Affect: Mood is elated.        Speech: Speech is rapid and pressured.        Behavior: Behavior is cooperative.        Thought Content: Thought content is delusional.        Cognition and Memory: Cognition is impaired.        Judgment: Judgment is impulsive.      Mental Status Exam: General Appearance: Disheveled  Orientation:  Full (Time, Place, and Person)  Memory:  Immediate;   Fair Recent;   Fair Remote;   Fair  Concentration:  Concentration: Fair and Attention Span: Fair  Recall:  Fair  Attention  Fair  Eye Contact:  Fair  Speech: At times garbled and difficult to understand   Language:  Poor  Volume:  Normal  Mood: Good  Affect: Elated, expansive  Thought Process: Disorganized, tangential  Thought Content:  Grandiose and identity delusions; hyperreligious content  Suicidal Thoughts:  No  Homicidal Thoughts:  No  Judgement:  Impaired  Insight:  Lacking  Psychomotor Activity:  Currently calm.  Akathisia:  No  Fund of Knowledge:  Poor   Assets:  Physical Health Resilience Social Support  Cognition: Poor  ADL's:  Intact  AIMS (if indicated):        Other History    These have been pulled in through the EMR, reviewed, and updated if appropriate.  Family History:  The patient's family history includes Asthma in an other family member; Cancer in an other family member; Seizures in his father and another family member.  Medical History: Past Medical History:  Diagnosis Date   ADHD (attention deficit hyperactivity disorder)    Asthma    Bipolar 1 disorder (HCC)    Depression    Schizophrenia (HCC)    Seizures (HCC)     Surgical History: Past Surgical History:  Procedure Laterality Date   COLONOSCOPY WITH ESOPHAGOGASTRODUODENOSCOPY (EGD)  10/28/2012   MFM:Dpwhoz tiny distal esophageal erosions consistent with mild erosive reflux esophagitis. Hiatal hernia. 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 no h.pylori   NO PAST SURGERIES     none       Medications:  Current Medications[1]  Allergies: Allergies[2]  David Blankenship, PMHNP       [1]  Current Facility-Administered Medications:    risperiDONE  (RISPERDAL  M-TABS) disintegrating tablet 2 mg, 2 mg, Oral, Q8H PRN, 2  mg at 10/05/24 0939 **AND** [COMPLETED] LORazepam  (ATIVAN ) tablet 1 mg, 1 mg, Oral, PRN, 1 mg at 10/05/24 0549 **AND** [COMPLETED] ziprasidone  (GEODON ) injection 20 mg, 20 mg, Intramuscular, PRN, Dasie Faden, MD, 20 mg at 10/05/24 9361  Current Outpatient Medications:    albuterol  (VENTOLIN  HFA) 108 (90 Base) MCG/ACT inhaler, Inhale 1-2 puffs into the lungs every 6 (six) hours as needed for wheezing or shortness of breath., Disp: 18 g, Rfl: 0   busPIRone  (BUSPAR ) 15 MG tablet, Take 1 tablet (15 mg total) by mouth 3 (three) times daily., Disp: 90 tablet, Rfl: 0   divalproex  (DEPAKOTE  ER) 500 MG 24 hr tablet, Take 2 tablets (1,000 mg total) by mouth in the morning and at bedtime., Disp: 120 tablet, Rfl: 0    doxycycline  (VIBRAMYCIN ) 100 MG capsule, Take 1 capsule (100 mg total) by mouth 2 (two) times daily., Disp: 20 capsule, Rfl: 0   haloperidol  (HALDOL ) 10 MG tablet, Take 2 tablets (20 mg total) by mouth daily., Disp: 60 tablet, Rfl: 0   haloperidol  (HALDOL ) 5 MG tablet, Take 1 tablet (5 mg total) by mouth every 8 (eight) hours as needed for agitation., Disp: 30 tablet, Rfl: 0   LORazepam  (ATIVAN ) 2 MG tablet, Take 1 tablet (2 mg total) by mouth every 8 (eight) hours as needed for anxiety., Disp: 30 tablet, Rfl: 0   nystatin -triamcinolone  (MYCOLOG II) cream, Apply to affected area daily, Disp: 15 g, Rfl: 0   traZODone  (DESYREL ) 150 MG tablet, Take 1 tablet (150 mg total) by mouth at bedtime., Disp: 30 tablet, Rfl: 0 [2] Allergies Allergen Reactions   Acetaminophen  Hives    Pt reports that he takes tylenol  but it was previously reported as an allergy.   Bee Pollen Hives   Shellfish Allergy Hives

## 2024-10-05 NOTE — ED Notes (Signed)
 Patient has spilled his drink 3 times. New socks provided and space has been cleaned. A new bed shift has been placed on bed. From now on, we will watch him drink the cup of liquid and take the cup back.

## 2024-10-05 NOTE — ED Notes (Signed)
 Pt is severely agitated. Yelling and cussing at staff and security. Attempting to walk the halls. MD notified.

## 2024-10-05 NOTE — ED Notes (Signed)
 Patient observed at nurse's station, engaged in conversation with nurse and tech. Appears to have calm down although still claims wanting to go home.

## 2024-10-05 NOTE — ED Notes (Signed)
 Pt taken to room 7 to complete TTS.  Pt walked away from room ranting and refuses to complete TTS at this time.  Pt returned to bed however continues to yell out and cause disturbance. Not redirectable.

## 2024-10-05 NOTE — ED Notes (Signed)
 Unable to obtain vital signs due to patient cooperation.

## 2024-10-05 NOTE — ED Notes (Signed)
 Pt is back up, yelling at staff. MD notified.

## 2024-10-05 NOTE — ED Notes (Signed)
 Patient started escalating and could not be redirected. He started yelling and getting increasingly agitated. With security present, IM PRN medication given. Will continue to monitor.

## 2024-10-05 NOTE — ED Notes (Signed)
 Patient is resting with eyes closed. No signs of distress noted. Respiration even and unlabored

## 2024-10-05 NOTE — ED Provider Notes (Addendum)
 Patient is rightly medically clear waiting for psychiatric evaluation.  He has been intermittently agitated and combative.  He did require Geodon  earlier.  Waiting for in person psychiatric evaluation this morning. Patient was seen by psychiatry and recommended inpatient admission.  He continues to have intermittent outbursts.  However his redirectable most of the time   Doretha Folks, MD 10/05/24 9198    Doretha Folks, MD 10/05/24 1315

## 2024-10-05 NOTE — ED Notes (Signed)
Patient resting with eyes closed. No signs of distress noted.

## 2024-10-05 NOTE — ED Notes (Signed)
 Pt has calmed down and fallen asleep.

## 2024-10-05 NOTE — Progress Notes (Signed)
 Inpatient Psychiatric Referral  Patient was recommended inpatient per [facility], NP There are no available beds at [facility], per [facility]. Patient was referred to the following out of network facilities:  [facility] Provider Address Phone [facility]  [address] [place] [address] [phone]  [facility]  [address] [place] [address] [phone]  [facility]  [address] [place] [address] [phone]  [facility]  [address] [place] [address] [phone]  [facility]  [address], [place] [address] [phone]  [facility]  [address] [place] [address] [phone]  [facility]FA[facility]  [address] [place], [place] [phone]  [facility]  [address] [place] [address] [phone]  [facility]  [address] [place] [address] [phone]  [facility]  [address] [place] [address] [phone]  [facility]  [address] [place] [address] [phone]  [facility]  [address], [place] [address] [phone]    Situation ongoing, CSW to continue following and update chart as more information becomes available.   Harrie Sofia MSW, [place] 10/05/2024

## 2024-10-05 NOTE — ED Notes (Signed)
 Patient was observed sitting on the side of the bed after drinking water . Patient is now resting with eyes closed. No signs of distress noted. Respirations even.

## 2024-10-05 NOTE — BH Assessment (Signed)
 Patient was deferred to IRIS for a telepsych assessment. The assigned care coordinator will provide updates regarding the scheduling of the assessment. IRIS coordinator can be reached at [phone] for further information on the timing of the telepsych evaluation.

## 2024-10-06 DIAGNOSIS — F29 Unspecified psychosis not due to a substance or known physiological condition: Secondary | ICD-10-CM

## 2024-10-06 MED ORDER — HALOPERIDOL 5 MG PO TABS
20.0000 mg | ORAL_TABLET | Freq: Every day | ORAL | Status: DC
  Administered 2024-10-06 – 2024-10-07 (×2): 20 mg via ORAL
  Filled 2024-10-06 (×2): qty 4

## 2024-10-06 MED ORDER — DIVALPROEX SODIUM ER 500 MG PO TB24
500.0000 mg | ORAL_TABLET | Freq: Two times a day (BID) | ORAL | Status: DC
  Administered 2024-10-06 – 2024-10-07 (×3): 500 mg via ORAL
  Filled 2024-10-06 (×3): qty 1

## 2024-10-06 MED ORDER — BUSPIRONE HCL 10 MG PO TABS
15.0000 mg | ORAL_TABLET | Freq: Three times a day (TID) | ORAL | Status: DC
  Administered 2024-10-06 – 2024-10-07 (×4): 15 mg via ORAL
  Filled 2024-10-06 (×4): qty 2

## 2024-10-06 NOTE — Progress Notes (Signed)
 Inpatient Psychiatric Referral  Patient was recommended inpatient per Jadeka Motley-Mangrum, NP . There are no available beds at [facility], per [facility] . Patient was referred to the following out of network facilities:  [facility] Provider Address Phone [facility]  [address], [place] [address] [phone]  [facility]  [address] [place] [address] [phone]  [facility]  [address] [place] [address] [phone]  [facility]  [address] [place] [address] [phone]  [facility]  [address] [place] [address] [phone]  [facility]  [address] [place] [address] [phone]  [facility]  [address] [place], [place] [phone]  [facility]  [address], [place] [address] [phone]  [facility]  [address], [place] [address] [phone]  [facility]  [address] [place] [address] [phone]  [facility]  [address] [place] [address] [phone]  [facility]  [address] [place] [address] [phone]    Situation ongoing, CSW to continue following and update chart as more information becomes available.

## 2024-10-06 NOTE — ED Notes (Signed)
 Patient currently resting with eyes closed. Vital signs were obtained. Patient was calm and cooperative during that time. No signs of distress noted.

## 2024-10-06 NOTE — ED Notes (Signed)
 Patient still resting. Respirations even and unlabored. Will obtain vital signs when he wakes up

## 2024-10-06 NOTE — Consult Note (Signed)
 [facility] Psychiatric Consult Initial  Patient Name: [name]  MRN: [id number]  DOB: [birth year]  Consult Order details:  Orders (From admission, onward)     Start     Ordered   10/04/24 2126  CONSULT TO CALL ACT TEAM       Ordering Provider: Dasie Faden, MD  Provider:  (Not yet assigned)  Question:  Reason for Consult?  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 male admitted: Presented to the EDfor 10/04/2024  7:43 PM for  public indecent exposure, disorganized and delusional behavior.  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     The patient continues to demonstrate acute psychosis with significant delusional content, agitation, and disorganized behavior, resulting in impaired judgment and inability to safely function in the community. Despite medication compliance and denial of SI/HI, his behavior and thought content reflect severe psychiatric decompensation and ongoing safety concerns.  He remains unable to engage in meaningful discharge planning and requires a structured, inpatient psychiatric setting for stabilization, medication management, and safety monitoring. Please see plan below for detailed recommendations.   Diagnoses:  Active Hospital problems: Principal Problem:   Psychosis (HCC)    Plan   ## Psychiatric Medication Recommendations:  Continue patient's home medications  ## Medical Decision  Making Capacity: Not specifically addressed in this encounter  ## Further Work-up:  -- CK total 445 on 10/04/2024, will get valproate level acid on 10/06/2024 EKG, While pt on Qtc prolonging medications, please monitor & replete K+ to 4 and Mg2+ to 2, or UDS -- most recent EKG on 10/04/2024 had QtC of 436   ## Disposition:-- Patient continues to meet criteria for inpatient psychiatric admission and is awaiting transfer to [facility].   ## Behavioral / Environmental: -Difficult Patient (SELECT OPTIONS FROM BELOW), To minimize splitting of staff, assign one staff person to communicate all information from the team when feasible., or Utilize compassion and acknowledge the patient's experiences while setting clear and realistic expectations for care.                ## Safety and Observation Level:  - Based on my clinical evaluation, I estimate the patient to be at high risk of self harm in the current setting. - At this time, we recommend  1:1 Observation. This decision is based on my review of the chart including patient's history and current presentation, interview of the patient, mental status examination, and consideration of suicide risk including evaluating suicidal ideation, plan, intent, suicidal or self-harm behaviors, risk factors, and protective factors. This judgment is based on our ability to directly address suicide risk, implement suicide prevention strategies, and develop a safety plan while the patient is in the clinical setting. Please contact our team if there is a concern that risk level has changed.   CSSR Risk Category:C-SSRS RISK CATEGORY: No Risk   Suicide Risk Assessment: Patient has following modifiable risk factors for suicide: recklessness, medication noncompliance, active mental  illness (to encompass adhd, tbi, mania, psychosis, trauma reaction), current symptoms: anxiety/panic, insomnia, impulsivity, anhedonia, hopelessness, and recent psychiatric hospitalization, which  we are addressing by recommending inpatient psychiatric admission. Patient has following non-modifiable or demographic risk factors for suicide: male gender and psychiatric hospitalization Patient has the following protective factors against suicide: Access to outpatient mental health care   Thank you for this consult request. Recommendations have been communicated to the primary team.  We will continue to follow while awaiting psychiatric bed placement at this time.    CATHALEEN ADAM, PMHNP       History of Present Illness  Relevant Aspects of Hospital ED Course:  Admitted on 10/04/2024 for Involuntary commitment (IVC) - public indecent exposure, disorganized and delusional behavior.   Patient Report:  The patient is a 38 year old male with a known history of schizophrenia versus schizoaffective disorder who presented to the emergency department following public indecent exposure and disorganized, delusional behavior.  On evaluation today, the patient continues to exhibit prominent psychotic symptoms. He remains highly delusional, stating that he must leave the hospital immediately because he needs to return to his job at [employer] in [place] . When asked how he planned to travel there, the patient stated he would walk. He also endorsed a grandiose delusion, stating that President Trump is his father.  The patient is easily agitated and requires frequent redirection. He has been observed standing and staring at the nurses station, requiring multiple prompts from staff to step away. When redirected, he stated, Why? Im not hurting anybody, Im just standing here. He reports being claustrophobic and states he cannot remain alone in his room. As he is currently the only patient on the hall, he reports discomfort with the environment and states he does not like watching television or being alone.  The patient has been compliant with prescribed medications while in the ED. He denies  suicidal ideation, homicidal ideation, and auditory or visual hallucinations; however, he continues to demonstrate severe delusional thinking and impaired reality testing.   Psych ROS:  Depression: Denies Anxiety: Denies Mania (lifetime and current): Denies Psychosis: (lifetime and current): Per chart previous history of psychosis   Collateral information:  none  Review of Systems  Psychiatric/Behavioral:         Delusional      Psychiatric and Social History  Psychiatric History:  Information collected from chart review, patient   Prev Dx/Sx: Per chart review he has a history of schizophrenia versus schizoaffective disorder, cannabis abuse, nicotine  use disorder, alcohol use, malingering Current Psych Provider: none  Home Meds (current): Does not remember the name of his medications Previous Med Trials: Per chart- Haldol , Haldol  LAI, Depakote , trazodone , BuSpar  Therapy: None   Prior Psych Hospitalization: Per chart yes Prior Self Harm: Patient denies Prior Violence: Patient denies.  However patient has become agitated while in the emergency department and has required medications for agitation.  Patient is currently in custody    Family Psych History: Denies Family Hx suicide: Denies   Social History:  Developmental Hx: Patient refused to answer Educational Hx: Patient refused to answer Occupational Hx:  unemployed [employer] Officer Hx: Currently in custody of law enforcement Living Situation: States he is homeless Spiritual Hx: deferred Access to weapons/lethal means: Denies but may not be reliable information due to psychosis   Substance History-denies all substance use.  However may not be reliable information due to current psychosis.  In addition patient has been in the custody of law's for [employer] since 11/11-UDS positive for benzodiazepines  and marijuana Alcohol: Denies Tobacco: Denies Illicit drugs: Denies Prescription drug abuse: Denies Rehab hx: Denies  Exam Findings   Physical Exam:  Vital Signs:  Temp:  [97.7 F (36.5 C)-98.4 F (36.9 C)] 97.8 F (36.6 C) (12/16 1757) Pulse Rate:  [69-104] 86 (12/16 1757) Resp:  [16-20] 20 (12/16 1757) BP: (114-130)/(75-90) 130/90 (12/16 1757) SpO2:  [100 %] 100 % (12/16 1757) Blood pressure (!) 130/90, pulse 86, temperature 97.8 F (36.6 C), temperature source Oral, resp. rate 20, SpO2 100%. There is no height or weight on file to calculate BMI.  Physical Exam Psychiatric:        Attention and Perception: Attention normal.        Mood and Affect: Mood is elated.        Speech: Speech is rapid and pressured.        Behavior: Behavior is cooperative.        Thought Content: Thought content is delusional.        Cognition and Memory: Cognition is impaired.        Judgment: Judgment is impulsive.      Mental Status Exam: General Appearance: Adult male, appears stated age  Orientation:  Full (Time, Place, and Person)  Memory:  Immediate;   Fair Recent;   Fair Remote;   Fair  Concentration:  Concentration: Fair and Attention Span: Fair  Recall:  Fair  Attention  Fair  Eye Contact:  Fair  Speech: At times garbled and difficult to understand   Language:  Poor  Volume:  Normal  Mood: Ok  Affect: labile  Thought Process: Disorganized, tangential  Thought Content:  Grandiose and identity delusions; hyperreligious content  Suicidal Thoughts:  No  Homicidal Thoughts:  No  Judgement:  Impaired; Denies AVH, though behavior suggests impaired reality testing  Insight:  Lacking  Psychomotor Activity:  Restless, intrusive, requires frequent redirection  Akathisia:  No  Fund of Knowledge:  Poor   Assets:  Physical Health Resilience Social Support  Cognition: Poor  ADL's:  Intact  AIMS (if indicated):        Other History   These have been pulled in through the EMR, reviewed, and updated if appropriate.  Family History:  The patient's family history includes Asthma in an other family member; Cancer  in an other family member; Seizures in his father and another family member.  Medical History: Past Medical History:  Diagnosis Date   ADHD (attention deficit hyperactivity disorder)    Asthma    Bipolar 1 disorder (HCC)    Depression    Schizophrenia (HCC)    Seizures (HCC)     Surgical History: Past Surgical History:  Procedure Laterality Date   COLONOSCOPY WITH ESOPHAGOGASTRODUODENOSCOPY (EGD)  10/28/2012   MFM:Dpwhoz tiny distal esophageal erosions consistent with mild erosive reflux esophagitis. Hiatal hernia. 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 no h.pylori   NO PAST SURGERIES     none       Medications:  Current Medications[1]  Allergies: Allergies[2]  Keyanni Whittinghill MOTLEY-MANGRUM, PMHNP        [1]  Current Facility-Administered Medications:    busPIRone  (BUSPAR ) tablet 15 mg, 15 mg, Oral, TID, Motley-Mangrum, Ameerah Huffstetler A, PMHNP, 15 mg at 10/06/24 1534   divalproex  (DEPAKOTE  ER) 24 hr tablet 500 mg, 500 mg, Oral, BID, Motley-Mangrum, Doralene Glanz A, PMHNP, 500 mg at 10/06/24 0905   haloperidol  (HALDOL ) tablet 20 mg, 20 mg, Oral, Daily,  Motley-Mangrum, Kamerin Axford A, PMHNP, 20 mg at 10/06/24 9095   hydrOXYzine  (ATARAX ) tablet 25 mg, 25 mg, Oral, TID PRN, Motley-Mangrum, Sanjna Haskew A, PMHNP, 25 mg at 10/06/24 0849   LORazepam  (ATIVAN ) injection 2 mg, 2 mg, Intramuscular, Q6H PRN, Motley-Mangrum, Dashia Caldeira A, PMHNP, 2 mg at 10/05/24 2227   risperiDONE  (RISPERDAL  M-TABS) disintegrating tablet 2 mg, 2 mg, Oral, Q8H PRN, 2 mg at 10/06/24 1407 **AND** [COMPLETED] LORazepam  (ATIVAN ) tablet 1 mg, 1 mg, Oral, PRN, 1 mg at 10/05/24 0549 **AND** [COMPLETED] ziprasidone  (GEODON ) injection 20 mg, 20 mg, Intramuscular, PRN, Dasie Faden, MD, 20 mg at 10/05/24 9361   traZODone  (DESYREL ) tablet 50 mg, 50 mg, Oral, QHS PRN, Motley-Mangrum, Ahnika Hannibal A,  PMHNP  Current Outpatient Medications:    albuterol  (VENTOLIN  HFA) 108 (90 Base) MCG/ACT inhaler, Inhale 1-2 puffs into the lungs every 6 (six) hours as needed for wheezing or shortness of breath., Disp: 18 g, Rfl: 0   busPIRone  (BUSPAR ) 15 MG tablet, Take 1 tablet (15 mg total) by mouth 3 (three) times daily. (Patient taking differently: Take 15 mg by mouth 2 (two) times daily.), Disp: 90 tablet, Rfl: 0   divalproex  (DEPAKOTE  ER) 500 MG 24 hr tablet, Take 2 tablets (1,000 mg total) by mouth in the morning and at bedtime., Disp: 120 tablet, Rfl: 0   haloperidol  (HALDOL ) 20 MG tablet, Take 20 mg by mouth daily., Disp: , Rfl:    doxycycline  (VIBRAMYCIN ) 100 MG capsule, Take 1 capsule (100 mg total) by mouth 2 (two) times daily. (Patient not taking: Reported on 10/06/2024), Disp: 20 capsule, Rfl: 0   LORazepam  (ATIVAN ) 2 MG tablet, Take 1 tablet (2 mg total) by mouth every 8 (eight) hours as needed for anxiety. (Patient not taking: Reported on 10/06/2024), Disp: 30 tablet, Rfl: 0   nystatin -triamcinolone  (MYCOLOG II) cream, Apply to affected area daily (Patient not taking: Reported on 10/06/2024), Disp: 15 g, Rfl: 0   traZODone  (DESYREL ) 150 MG tablet, Take 1 tablet (150 mg total) by mouth at bedtime. (Patient not taking: Reported on 10/06/2024), Disp: 30 tablet, Rfl: 0 [2]  Allergies Allergen Reactions   Acetaminophen  Hives    Pt reports that he takes tylenol  but it was previously reported as an allergy.   Bee Pollen Hives   Shellfish Allergy Hives

## 2024-10-06 NOTE — Progress Notes (Signed)
 Pt has been accepted to [facility] on 10/06/2024 . Bed assignment: Main campus  Pt meets inpatient criteria per Cathaleen Adam, NP   Attending Physician will be Millie Manners, MD  Report can be called to: [phone] (this is a pager, please leave call-back number when giving report)  Pt can arrive after ASAP  Care Team Notified: Sharlett Baseman, Paramedic, Chesley Holt, Cathaleen Adam, NP

## 2024-10-06 NOTE — ED Notes (Signed)
 Called Sheriff to transport patient to [facility] and they said it would  not be today but if anything changed they would let us  know.

## 2024-10-07 NOTE — ED Notes (Addendum)
 Called sheriff to confirm pt is on transportation list, they confirmed, but state they do not have an estimate at this time.

## 2024-10-07 NOTE — ED Notes (Addendum)
 Called [facility] for report

## 2024-10-07 NOTE — ED Notes (Signed)
 Pt released into custody of [facility] for transport to [facility]

## 2024-10-07 NOTE — ED Provider Notes (Signed)
 Emergency Medicine Observation Re-evaluation Note  David Blankenship is a 38 y.o. male, seen on rounds today.  Pt initially presented to the ED for complaints of IVC Currently, the patient is talking on the phone and awaiting placement.  Physical Exam  BP 117/75 (BP Location: Right Arm)   Pulse 63   Temp 97.6 F (36.4 C)   Resp 20   SpO2 100%  Physical Exam General: Alert and ambulatory. Lungs: Respirations nonlabored.  Speaking rapidly in full sentences. Psych: Patient is situationally alert and oriented.  He is speaking rapidly on the phone.  ED Course / MDM  EKG:EKG Interpretation Date/Time:  Sunday October 04 2024 20:38:29 EST Ventricular Rate:  98 PR Interval:  141 QRS Duration:  83 QT Interval:  341 QTC Calculation: 436 R Axis:   86  Text Interpretation: Sinus tachycardia Atrial premature complex Left ventricular hypertrophy Confirmed by Dasie Faden ([id number]) on 10/04/2024 9:23:36 PM  I have reviewed the labs performed to date as well as medications administered while in observation.  Recent changes in the last 24 hours include none.  Plan  Current plan is for inpatient placement at [facility].    Armenta Canning, MD 10/07/24 [phone]

## 2024-10-07 NOTE — ED Notes (Signed)
 Patient become restless and agitated after not getting sandwich. RN explained about protocol for snacks but patient remain to be agitated. Ativan  given. Will continue to monitor.

## 2024-10-07 NOTE — ED Notes (Addendum)
 Called [facility] for report/pager, left HIPAA compliant voicemail and callback number.
